# Patient Record
Sex: Male | Born: 1965 | Race: White | Hispanic: No | Smoking: Never smoker
Health system: Southern US, Community
[De-identification: ages and names within clinical notes are randomized; demographics above are authoritative.]

## PROBLEM LIST (undated history)

## (undated) DIAGNOSIS — N189 Chronic kidney disease, unspecified: Secondary | ICD-10-CM

## (undated) DIAGNOSIS — K56609 Unspecified intestinal obstruction, unspecified as to partial versus complete obstruction: Secondary | ICD-10-CM

## (undated) DIAGNOSIS — N200 Calculus of kidney: Secondary | ICD-10-CM

## (undated) DIAGNOSIS — T7840XA Allergy, unspecified, initial encounter: Secondary | ICD-10-CM

## (undated) DIAGNOSIS — I10 Essential (primary) hypertension: Secondary | ICD-10-CM

## (undated) DIAGNOSIS — Z87442 Personal history of urinary calculi: Secondary | ICD-10-CM

## (undated) DIAGNOSIS — Z8719 Personal history of other diseases of the digestive system: Secondary | ICD-10-CM

## (undated) HISTORY — DX: Allergy, unspecified, initial encounter: T78.40XA

## (undated) HISTORY — PX: POLYPECTOMY: SHX149

## (undated) HISTORY — PX: COLOSTOMY: SHX63

## (undated) HISTORY — PX: OTHER SURGICAL HISTORY: SHX169

---

## 2001-06-30 HISTORY — PX: APPENDECTOMY: SHX54

## 2001-06-30 HISTORY — PX: COLON SURGERY: SHX602

## 2001-06-30 HISTORY — PX: COLOSTOMY REVERSAL: SHX5782

## 2001-12-21 ENCOUNTER — Encounter: Payer: Self-pay | Admitting: Emergency Medicine

## 2001-12-22 ENCOUNTER — Inpatient Hospital Stay (HOSPITAL_COMMUNITY): Admission: EM | Admit: 2001-12-22 | Discharge: 2001-12-27 | Payer: Self-pay | Admitting: Emergency Medicine

## 2002-03-14 ENCOUNTER — Encounter: Payer: Self-pay | Admitting: General Surgery

## 2002-03-14 ENCOUNTER — Encounter: Admission: RE | Admit: 2002-03-14 | Discharge: 2002-03-14 | Payer: Self-pay | Admitting: General Surgery

## 2002-04-05 ENCOUNTER — Encounter (INDEPENDENT_AMBULATORY_CARE_PROVIDER_SITE_OTHER): Payer: Self-pay

## 2002-04-05 ENCOUNTER — Inpatient Hospital Stay (HOSPITAL_COMMUNITY): Admission: RE | Admit: 2002-04-05 | Discharge: 2002-04-11 | Payer: Self-pay | Admitting: General Surgery

## 2003-03-11 ENCOUNTER — Encounter: Payer: Self-pay | Admitting: Emergency Medicine

## 2003-03-11 ENCOUNTER — Emergency Department (HOSPITAL_COMMUNITY): Admission: EM | Admit: 2003-03-11 | Discharge: 2003-03-11 | Payer: Self-pay | Admitting: Unknown Physician Specialty

## 2006-06-25 ENCOUNTER — Ambulatory Visit (HOSPITAL_COMMUNITY): Admission: RE | Admit: 2006-06-25 | Discharge: 2006-06-25 | Payer: Self-pay | Admitting: Urology

## 2006-06-25 ENCOUNTER — Emergency Department (HOSPITAL_COMMUNITY): Admission: EM | Admit: 2006-06-25 | Discharge: 2006-06-25 | Payer: Self-pay | Admitting: Emergency Medicine

## 2008-06-30 HISTORY — PX: HERNIA REPAIR: SHX51

## 2008-06-30 HISTORY — PX: CHOLECYSTECTOMY: SHX55

## 2008-12-19 ENCOUNTER — Inpatient Hospital Stay (HOSPITAL_COMMUNITY): Admission: EM | Admit: 2008-12-19 | Discharge: 2008-12-21 | Payer: Self-pay | Admitting: Emergency Medicine

## 2008-12-25 ENCOUNTER — Inpatient Hospital Stay (HOSPITAL_COMMUNITY): Admission: EM | Admit: 2008-12-25 | Discharge: 2009-01-04 | Payer: Self-pay | Admitting: Emergency Medicine

## 2008-12-26 ENCOUNTER — Encounter (INDEPENDENT_AMBULATORY_CARE_PROVIDER_SITE_OTHER): Payer: Self-pay | Admitting: General Surgery

## 2010-10-06 LAB — CBC
HCT: 34.3 % — ABNORMAL LOW (ref 39.0–52.0)
HCT: 36.6 % — ABNORMAL LOW (ref 39.0–52.0)
Hemoglobin: 12.6 g/dL — ABNORMAL LOW (ref 13.0–17.0)
MCHC: 33.8 g/dL (ref 30.0–36.0)
MCHC: 34.2 g/dL (ref 30.0–36.0)
MCV: 84.2 fL (ref 78.0–100.0)
MCV: 84.6 fL (ref 78.0–100.0)
MCV: 85 fL (ref 78.0–100.0)
Platelets: 312 10*3/uL (ref 150–400)
Platelets: 409 10*3/uL — ABNORMAL HIGH (ref 150–400)
RBC: 3.88 MIL/uL — ABNORMAL LOW (ref 4.22–5.81)
RBC: 4.33 MIL/uL (ref 4.22–5.81)
WBC: 10 10*3/uL (ref 4.0–10.5)
WBC: 8.3 10*3/uL (ref 4.0–10.5)

## 2010-10-06 LAB — BASIC METABOLIC PANEL
BUN: 2 mg/dL — ABNORMAL LOW (ref 6–23)
BUN: 3 mg/dL — ABNORMAL LOW (ref 6–23)
CO2: 27 mEq/L (ref 19–32)
CO2: 32 mEq/L (ref 19–32)
Calcium: 8.9 mg/dL (ref 8.4–10.5)
Chloride: 102 mEq/L (ref 96–112)
Chloride: 98 mEq/L (ref 96–112)
Creatinine, Ser: 0.71 mg/dL (ref 0.4–1.5)
Creatinine, Ser: 0.84 mg/dL (ref 0.4–1.5)
GFR calc Af Amer: >60 mL/min (ref >60)
Potassium: 3.7 mEq/L (ref 3.5–5.1)

## 2010-10-07 LAB — COMPREHENSIVE METABOLIC PANEL
ALT: 20 U/L (ref 0–53)
ALT: 34 U/L (ref 0–53)
AST: 21 U/L (ref 0–37)
AST: 29 U/L (ref 0–37)
Albumin: 3.7 g/dL (ref 3.5–5.2)
Albumin: 4 g/dL (ref 3.5–5.2)
Alkaline Phosphatase: 76 U/L (ref 39–117)
BUN: 10 mg/dL (ref 6–23)
BUN: 9 mg/dL (ref 6–23)
CO2: 24 mEq/L (ref 19–32)
CO2: 27 mEq/L (ref 19–32)
Calcium: 8.4 mg/dL (ref 8.4–10.5)
Calcium: 9.8 mg/dL (ref 8.4–10.5)
Chloride: 104 mEq/L (ref 96–112)
Chloride: 105 mEq/L (ref 96–112)
Chloride: 99 mEq/L (ref 96–112)
Creatinine, Ser: 0.97 mg/dL (ref 0.4–1.5)
GFR calc Af Amer: >60 mL/min (ref >60)
GFR calc Af Amer: >60 mL/min (ref >60)
GFR calc non Af Amer: >60 mL/min (ref >60)
GFR calc non Af Amer: >60 mL/min (ref >60)
Potassium: 4.3 mEq/L (ref 3.5–5.1)
Potassium: 4.4 mEq/L (ref 3.5–5.1)
Sodium: 134 mEq/L — ABNORMAL LOW (ref 135–145)
Sodium: 140 mEq/L (ref 135–145)
Total Bilirubin: 0.7 mg/dL (ref 0.3–1.2)
Total Bilirubin: 0.9 mg/dL (ref 0.3–1.2)
Total Protein: 7 g/dL (ref 6.0–8.3)

## 2010-10-07 LAB — DIFFERENTIAL
Basophils Absolute: 0 10*3/uL (ref 0.0–0.1)
Basophils Absolute: 0 10*3/uL (ref 0.0–0.1)
Basophils Relative: 0 % (ref 0–1)
Basophils Relative: 0 % (ref 0–1)
Eosinophils Absolute: 0.1 K/uL (ref 0.0–0.7)
Eosinophils Relative: 0 % (ref 0–5)
Eosinophils Relative: 1 % (ref 0–5)
Lymphocytes Relative: 14 % (ref 12–46)
Lymphs Abs: 1.6 10*3/uL (ref 0.7–4.0)
Monocytes Absolute: 0.5 10*3/uL (ref 0.1–1.0)
Monocytes Absolute: 0.9 K/uL (ref 0.1–1.0)
Monocytes Relative: 3 % (ref 3–12)
Monocytes Relative: 8 % (ref 3–12)
Neutro Abs: 14.1 10*3/uL — ABNORMAL HIGH (ref 1.7–7.7)
Neutro Abs: 8.6 10*3/uL — ABNORMAL HIGH (ref 1.7–7.7)
Neutrophils Relative %: 77 % (ref 43–77)

## 2010-10-07 LAB — URINALYSIS, ROUTINE W REFLEX MICROSCOPIC
Glucose, UA: NEGATIVE mg/dL
Ketones, ur: 40 mg/dL — AB
Nitrite: NEGATIVE
Protein, ur: NEGATIVE mg/dL
Urobilinogen, UA: 0.2 mg/dL (ref 0.0–1.0)

## 2010-10-07 LAB — POTASSIUM: Potassium: 3.9 mEq/L (ref 3.5–5.1)

## 2010-10-07 LAB — LIPASE, BLOOD: Lipase: 17 U/L (ref 11–59)

## 2010-10-07 LAB — CBC
HCT: 46.6 % (ref 39.0–52.0)
HCT: 47.8 % (ref 39.0–52.0)
Hemoglobin: 14.4 g/dL (ref 13.0–17.0)
Hemoglobin: 16 g/dL (ref 13.0–17.0)
MCHC: 33.5 g/dL (ref 30.0–36.0)
MCHC: 34.2 g/dL (ref 30.0–36.0)
MCV: 84.7 fL (ref 78.0–100.0)
Platelets: 288 10*3/uL (ref 150–400)
Platelets: 346 10*3/uL (ref 150–400)
RBC: 5.01 MIL/uL (ref 4.22–5.81)
RBC: 5.25 MIL/uL (ref 4.22–5.81)
RBC: 5.64 MIL/uL (ref 4.22–5.81)
RDW: 13.1 % (ref 11.5–15.5)
RDW: 13.4 % (ref 11.5–15.5)
WBC: 10.8 10*3/uL — ABNORMAL HIGH (ref 4.0–10.5)
WBC: 11.2 10*3/uL — ABNORMAL HIGH (ref 4.0–10.5)
WBC: 15.6 10*3/uL — ABNORMAL HIGH (ref 4.0–10.5)

## 2010-10-07 LAB — COMPREHENSIVE METABOLIC PANEL WITH GFR
ALT: 48 U/L (ref 0–53)
AST: 30 U/L (ref 0–37)
Alkaline Phosphatase: 75 U/L (ref 39–117)
GFR calc Af Amer: >60 mL/min (ref >60)
Glucose, Bld: 105 mg/dL — ABNORMAL HIGH (ref 70–99)
Potassium: 3.7 meq/L (ref 3.5–5.1)
Sodium: 136 meq/L (ref 135–145)
Total Protein: 8.1 g/dL (ref 6.0–8.3)

## 2010-10-07 LAB — CREATININE, SERUM
Creatinine, Ser: 0.91 mg/dL (ref 0.4–1.5)
GFR calc non Af Amer: >60 mL/min (ref >60)

## 2010-11-12 NOTE — H&P (Signed)
NAME:  Tyrone, Fernandez NO.:  [id number]   MEDICAL RECORD NO.:  [id number]          PATIENT TYPE:  INP   LOCATION:  1540                         FACILITY:  [facility]   PHYSICIAN:  Anselm Pancoast. Weatherly, M.D.DATE OF BIRTH:  03/24/1966   DATE OF ADMISSION:  12/25/2008  DATE OF DISCHARGE:                              HISTORY & PHYSICAL   CHIEF COMPLAINT:  Nausea, vomiting, abdominal pain.   HISTORY:  Tyrone Fernandez is a 45 year old Caucasian male who is said in  the hospital last week admitted by Dr. Michaell Cowing for abdominal stitching and  a partial small bowel obstruction.  Supposedly, his symptoms quickly  subsided after approximately 2 days.  He had NG tube for about 24 hours  and abdominal x-ray appeared improved and he was discharged, but he said  he continued to be nauseous at home, had several episodes of vomiting  and returned to the emergency room on the early a.m. of June 28.  He had  a CT on the previous admission that showed it looks as if the small  bowel obstruction was in the distal small bowel.  His previous history  is significant, and he had a perforated sigmoid diverticulitis that I  took care of approximately 7-8 years ago.  He did have a colostomy  takedown.  He had gained a moderate amount of weight, and on the x-ray  when he presented to the hospital last week, it appears he got a  definite hernia, right at his navel that looks like it has multiple  little loops of bowel and kind of Swiss-cheese areas of the abdomen.  He  also has got stones in his gallbladder, and whether he actually had  severe episodes of pain like a gallbladder attack or not, we are not  sure but his gallbladder was distended, and the pain and nausea and  vomiting, whether it is related to be the bowel obstruction or whether  it also has some gallbladder attacks, we are not sure.  Dr. Gerrit Friends was  on call early Monday morning, and he asked Dr. Michaell Cowing who had been caring  for him  previously to see the patient, and Dr. Michaell Cowing saw him  approximately 7:40 in the morning.  The patient was sent up to the floor  with instructions for the nurse to place a nasogastric tube.  The family  desired that either I, Dr. Derrell Lolling or Dr. Abbey Chatters to manage him, and  Dr. Michaell Cowing contacted me probably about 8:30 on Monday morning, and I was  available.   PHYSICAL EXAMINATION:  ABDOMEN:  Distended.  It was certainly not  acutely tender.  I discussed with him after reviewing the x-rays where  the previous x-rays were improved, now he has multiple air-fluid levels  was very dilated small bowel, and I recommended that we go ahead and  place the nasogastric tube, start intravenous fluids and suck on them a  few days.  An NG tube was placed.  It was very difficult to place  because it appears to kind of going against the larynx.  A lot of  coughing and spasm, but finally we were able to get it into the stomach,  and he had a moderate amount of kind of bilious NG drainage.  I did not  place the patient on antibiotics, but will continue with the nasogastric  suction and intravenous fluids.  I have discussed with the patient that  with this kind of combination of issues the patient would need lysis of  adhesions and bowel obstruction is the most pressing problem.  It looks  like he also got a gallbladder issue, and then the incisional hernia,  the combination of with the gallbladder and trying to place any type of  mesh is fairly high incidence of infections and the patient and his wife  are aware of that.   PAST MEDICAL HISTORY:  History of diverticulitis with colostomy  takedown.   MEDICATIONS:  He is on Lexapro 10 mg daily and Prilosec over-the-  counter.   ALLERGIES:  None.   SOCIAL HISTORY:  He works as a Psychologist, occupational for [employer].  He is married  and has several children.   PHYSICAL EXAMINATION:  GENERAL:  He is a pleasant, overweight, Caucasian  male, nauseous, but not really in  real acute distress.  VITAL SIGNS:  When he got to the floor, temperature was 98.4, blood  pressure 115/76, pulse 76, respirations 20.  He is not sure of his  weight, probably about 250, but supposedly has lost about 30-40 pounds  over the last 6 months.  That is through dietary efforts.  HEENT:  Appears adequately hydrated.  LUNGS:  Clear.  CARDIAC:  Normal sinus rhythm.  ABDOMEN:  He is distended, few bowel sounds but decreased bowel sounds.  RECTAL:  I did not do a rectal examination on him.  CNS:  Appears physiologic.  SKIN:  Unremarkable.   STUDIES:  Liver function studies normal.  His white blood count is  11,200.  He had about a 16,000 white count when he was in hospital a  week earlier.   ADMISSION RECURRENT PROBLEM:  1. Mechanical small-bowel obstruction probably adhesions.  2. Gallstones with distended gallbladder incisional hernia.      Anselm Pancoast. Zachery Dakins, M.D.  Electronically Signed     WJW/MEDQ  D:  12/26/2008  T:  12/27/2008  Job:  [id number]

## 2010-11-12 NOTE — Op Note (Signed)
NAME:  Tyrone Fernandez, Tyrone Fernandez NO.:  [id number]   MEDICAL RECORD NO.:  [id number]          PATIENT TYPE:  INP   LOCATION:  1540                         FACILITY:  [facility]   PHYSICIAN:  Anselm Pancoast. Weatherly, M.D.DATE OF BIRTH:  11/19/1965   DATE OF PROCEDURE:  12/26/2008  DATE OF DISCHARGE:                               OPERATIVE REPORT   PREOPERATIVE DIAGNOSES:  1. Small-bowel obstruction, probably adhesions.  2. Gallstones.  3. Incisional hernia.   POSTOPERATIVE DIAGNOSES:  1. Small-bowel obstruction, mechanical secondary to adhesions.  2. Cholecystitis, distended tense gallbladder and two incisional      hernias with multiple Swiss cheese type arrangement.   OPERATION:  Exploratory laparotomy, lysis of adhesions for mechanical  small-bowel obstruction, cholecystectomy, no cholangiogram and repair of  incisional hernia with sutures.   ANESTHESIA:  General anesthesia.   SURGEON:  Dr. Consuello Bossier.   ASSISTANT:  Dr. Angelia Mould. Derrell Lolling.   HISTORY:  Tyrone Fernandez is a 45 year old overweight Caucasian male who is  now approximately 6-7 years following a perforated sigmoid  diverticulitis with abscess and had a colectomy, end colostomy, Hartmann  and then several months later the colostomy was taken down.  The patient  has gained a moderate amount of weight in the last several years, and he  recently has actually lost weight according to his wife.  He was  admitted last week for approximately 2 days with a partial small-bowel  obstruction by Dr. Michaell Cowing, thought to be resolving.  He was released, but  he has continued to be nauseous and he started having vomiting over the  weekend and was readmitted early Monday morning.  Dr. Michaell Cowing saw the  patient, but he desired that I take care of him and I was available and  I agreed.  I discussed with the patient that one; we will place a  nasogastric tube to try to see if we could decompress his small bowel  and then plan on  lysis of adhesions.  He needs his gallbladder removed  and try to repair the incisional hernia.  The family and the patient are  aware that the bowel obstruction and the need for acholecystectomy is a  bad combination if the need plastic type meshes for the repair of the  incisional hernia. The patient has been on nasogastric suction now for  approximately 36 hours, having a large amount of bilious drainage.  Essentially no flatus, and I think that we just need to proceed on with  a surgery.  The patient was in agreement.  He was given 3.375 grams of  Zosyn preoperatively.  A nasogastric tube has been on suction all day  and he is making good urine output.   DESCRIPTION OF PROCEDURE:  The patient was positioned on the OR table.  Induction of general anesthesia, endotracheal tube placed by Dr.  Jean Rosenthal, anesthetist.  He already has a nasogastric tube.  After PAS  stockings were placed, a Foley catheter was inserted.  The abdomen was  clipped around the old midline incision and on reviewing the CT of the  patient's abdomen, it  appears that the hernia is kind of in the upper  area more so than below the umbilicus.  Sharp dissection.  A timeout was  completed appropriately and then had been draped in sterile manner.  I  made a little incision above and below the umbilicus and into a hernia  sac very promptly.  I carefully dissected the hernia sac, just opened  it, and he has got this kind of Swiss cheese type area.  There is no  loops of dilated bowel like the obstruction is from the hernia, but the  bowel is very distended.  I freed up the fascia, so I could open it  probably about 2 inches below the umbilicus.  We took it about 5 inches  above the umbilicus and this is all basically where the hernia sac is.  We then freed the omentum that was adherent to the undersurface of the  abdomen and then the gallbladder was tense, but not acutely inflamed.  I  then basically decided to go ahead and  free up the small bowel first.  There were numerous very dense adhesions down in the pelvis and that is  where the actual point of obstruction was, probably about 2 feet of the  ileocecal valve area.  After we had got the adhesions all freed, then we  could milk back the sulcus entericus into the stomach.  The anesthetist  replaced the NG tube since she had been manipulated it and appeared to  pull it back into the esophagus trying to get everything decompressed in  the stomach, and she placed a new one since it was easier than trying to  slip the old down when she had pulled it back up.  We got out probably  about a canister and a half of NG drainage.  Next, I put a Thompson  retractor in and was working from the left side.  Dr. Derrell Lolling was  assisting from the right side because it gave me better exposure and I  was able to I think safely remove the gallbladder without actually  extended the incision a lot further up.  I put two packs over the colon,  sort of pushing it down, freed up the adhesions around the gallbladder  and then elected to take the gallbladder down in a retrograde manner.  The patient had small stones in his gallbladder and we freed it up,  identified the cystic artery that was doubly clipped with the medium  sized clips and distally.  Once we divided this, then we continued to  dissect and could visualize a small cystic duct and this was actually  tied with a 3-0 Vicryl after it had been clamped with a right-angle and  the gallbladder removed.  I did not do a cholangiogram.  I do not think  we damaged the common bile duct which was narrow on the CT.  I then put  a clip just proximal to the little tie with the small clips.  Since the  gallbladder exposure was difficult, I did place a Blake drain in the  gallbladder fossa and brought it out through a stab wound in the right  upper quadrant.  Next, the small bowel which was decompressed since we  had sucked out the  contents from the stomach.  We were looking at our  options.  I think that we can get the fascia together with sutures and  not plan on using the biological mesh, and I used a kind of  double  looped #1 PDS with interrupted #1 Novofil sutures going wide to where we  had good fascia and all of the hernia sac and all had been removed.  Numerous little vessel areas had been tied with 2-0 Vicryl or cautery  and the fascia is snug, but comes together without excessive tension.  The subcutaneous tissue was debrided and closed with 3-0 Vicryl.  The  skin closed with staples.  We then put sterile 4x4s and ABD on the  dressing.  We put on an abdominal binder and made sure that the  nasogastric tube was in good position prior to closing the abdomen.  The  drain is in the right upper quadrant and does not appear to have any  bilious drainage.  I am going to keep him on Zosyn postoperatively.  We  will keep him on PAS stockings and will start Lovenox in the morning 40  mcg a day.      Anselm Pancoast. Zachery Dakins, M.D.  Electronically Signed     WJW/MEDQ  D:  12/26/2008  T:  12/27/2008  Job:  [id number]

## 2010-11-12 NOTE — H&P (Signed)
NAME:  BRANDY, KABAT NO.:  [id number]   MEDICAL RECORD NO.:  [id number]          PATIENT TYPE:  INP   LOCATION:  1536                         FACILITY:  [facility]   PHYSICIAN:  Ardeth Sportsman, MD     DATE OF BIRTH:  1966/03/24   DATE OF ADMISSION:  12/18/2008  DATE OF DISCHARGE:                              HISTORY & PHYSICAL   PRIMARY CARE:  Lajean Manes with Family [facility].   SURGEON:  Karie Soda.   CHIEF COMPLAINTS:  Nausea, vomiting, and abdominal pain.   PRESENT ILLNESS:  Mr. Jimmey Ralph is a 44 year old male with a history of  perforated diverticulitis requiring emergency colectomy and colostomy in  2003.  Four months later, he had a colostomy takedown.  He recalls  getting a barium enema around that time that was normal which showed no  evidence of any diverticula or polyps.  He has never had a follow-up  colonoscopy.  He has never had a prior abdominal complaints, never prior  history of nausea and vomiting before.  Does have a history of kidney  stones and actually had to have surgery back in 2007 with ureteroscopy  extraction double stent placement.   The patient yesterday started having worsening abdominal pain and nausea  and vomiting.  Emesis became so severe he started bringing up flecks of  blood.  Based on concerns, family brought him to the [facility]  He was evaluated Dr. Ashley Akin.  Dr. Ashley Akin noted an  elevated white count on plain films that showed some air-fluid levels.  Based on concerns, the patient was sent to the emergency room for  evaluation of concern for bowel obstruction.  Therefore, surgical  consultation was requested.   The patient denies any history of dysphagia or heartburn or reflux.  No  history of biliary colic.  Pain described as in his lower abdomen.  It  has decreased since he has been in the emergency room.  He still has  some moderate nausea, though, and needing medications pretty regularly.  He  denies any fevers, chills, sweats.  Denies any dysphagia to solids or  liquids.  No hematochezia or melena.  No dysuria, pyuria or hematuria.   PAST MEDICAL HISTORY:  1. Diverticulitis, perforated requiring urgent surgery and later      colostomy takedown in October 2003.  2. Kidney stones.  3. Anxiety/anger issues.   MEDICATIONS:  He is on Lexapro daily.   ALLERGIES:  None.   SOCIAL HISTORY:  No tobacco or drug use.  He is married in a stable  relationship and is here today with his father as well.   FAMILY HISTORY:  Is noncontributory for any early cardiopulmonary  disease or diabetes or GI tract disorders that he or his family can  recall.   REVIEW OF SYSTEMS:  Noted per HPI.  GENERAL:  No fevers, chills, sweats.  No weight gain or weight loss.  EYES:  Negative.  ENT:  Negative.  CARDIAC:  Negative.  RESPIRATORY:  Is negative.  NEUROLOGICAL:  Negative.  MUSCULOSKELETAL:  Negative.  GU:  As  noted per HPI, otherwise  negative.  TESTICULAR:  Negative.  BREASTS:  Negative.  GI:  As noted  per HPI.  PSYCH:  There have been issues of anxiety and frustration but  otherwise negative.  HEME/ALLERGIC:  Otherwise negative.  DERMATOLOGIC:  Negative.   PHYSICAL EXAMINATION:  VITAL SIGNS:  His T-max is 98.6, respirations 18,  pulse 83, blood pressure 121/70.  GENERAL:  He is well-developed, well-nourished, overweight male lying in  bed, tired but not frankly toxic.  PSYCH:  He is at best awake, alert, and oriented x4 with no evidence of  any dementia, delirium, psychosis or paranoia.  EYES:  Pupils equal, round, and reactive to light.  His sclerae are  mildly injected but otherwise are clear.  No evidence of any icterus.  NEUROLOGICAL:  Cranial II-XII are intact.  Hand grips 5/5 equal and  symmetrical.  No resting or intention tremors.  HEENT:  He is normocephalic.  Membranes are dry, but nasopharynx and  oropharynx are clear.  Dentition is alright.  CHEST:  Clear to auscultation  bilaterally.  No wheezes, rales, or  rhonchi.  No pain to rib or sternal compression.  HEART:  Regular rate and rhythm.  No murmurs, clicks or rubs.  Normal  radial and dorsalis pedis pulses.  ABDOMEN:  Is soft and obese.  He has an infraumbilical incision with a  spontaneous ventral hernia on Valsalva that spontaneously reduces.  It  is not particularly tender.  He has no suprapubic tenderness.  He has no  guarding or rebound tenderness.  GENITAL:  Normal external male genitalia, circumcised.  No obvious  inguinal hernias.  RECTAL:  Refused at this point.  MUSCULOSKELETAL:  Normal range motion at shoulders, elbows, wrists as  well as at hips, knees and ankles.  LYMPH:  No head, neck, axillary or groin lymphadenopathy.  SKIN:  No petechiae, no purpura, no telangiectasis, no caput medusae.  BREASTS:  No sores or lesions or nipple discharge.  BACK:  No pain on cervical thoracolumbosacral spine.  No pain in  costophrenic angles.   STUDIES:  Show white count of 15.6 outside and now is apparently 17.  Electrolytes are all normal except for glucose of 115.  He does have  some ketones in his urinalysis.   Verbal report of the x-ray showed some air-fluid levels in the outside  facility, but I do not have the films with me.   A CAT scan of the abdomen and pelvis with oral and IV contrast, I was  able to review.  He had some mildly dilated small-bowel loops with the  transition zone most likely down in the bladder.  He has a moderate-size  ventral hernia with some omentum in it but no small bowel.  No inguinal  hernias.  He does have right kidney stones, but they are in the renal  pelvis, and there was no evidence of any hydronephrosis or ureteral  obstruction.  He does have incidentally noted gallstones as well but no  gallbladder wall thickening or pericholecystic fluid.   ASSESSMENT AND PLAN:  1. A 45 year old male with nausea, vomiting, and abdominal pain, and      CT scan findings and  history were concerning for small-bowel      obstruction.      a.     Admit.      b.     IV fluids.      c.     NG tube decompression.  d.     Serial exams and probable follow-up films.      e.     If he does not improve, he may require laparoscopic versus       open exploration with probable lysis of adhesions.  2. Ventral hernia.  I do not think that is the source of his bowel      obstruction at this point.  We will follow.  Recommend he have that      evaluated for probable elective repair; most likely laparoscopic      will be an advantage for him.  We will table that issue for right      now since I am not convinced that the bowel obstruction or ileus is      at the hernia itself, and it is more down adhesions inferior to      where the incisional hernia is.  3. Continue Lexapro for anxiety and depression management.  No      evidence of any distress right now.  4. Proton-pump inhibitor with noted hematemesis.  5. DVT prophylaxis with SCDs and heparin.  Early mobilization.      Ardeth Sportsman, MD  Electronically Signed     SCG/MEDQ  D:  12/19/2008  T:  12/19/2008  Job:  [id number]   cc:   Oley Balm. Georgina Pillion, M.D.  Fax: [phone]   Ashley Akin, M.D.  [facility]

## 2010-11-15 NOTE — Op Note (Signed)
NAME:  Tyrone Fernandez, Tyrone Fernandez NO.:  [id number]   MEDICAL RECORD NO.:  [id number]          PATIENT TYPE:  AMB   LOCATION:  DAY                          FACILITY:  [facility]   PHYSICIAN:  Mark C. Vernie Ammons, M.D.  DATE OF BIRTH:  Feb 07, 1966   DATE OF PROCEDURE:  06/25/2006  DATE OF DISCHARGE:                               OPERATIVE REPORT   PREOPERATIVE DIAGNOSIS:  Left ureteral calculus.   POSTOPERATIVE DIAGNOSIS:  Left ureteral calculus.   PROCEDURE:  Cystoscopy, left retrograde pyelogram with interpretation,  left ureteroscopy with stone extraction and double-J stent placement.   SURGEON:  Mark C. Vernie Ammons, M.D.   ANESTHESIA:  General.   DRAINS:  5-French, 26 cm Polaris stent in the left ureter with string.   SPECIMENS:  Stone to patient.   BLOOD LOSS:  Minimal.   COMPLICATIONS:  None.   INDICATIONS:  The patient is a 45 year old white male who awoke this  morning with severe left flank pain.  He continues to have unremitting  pain due to his left ureteral calculus which was seen on CT scan in the  upper ureter measuring approximately 6 mm in size.  We discussed the  treatment options.  He would like to have the stone extracted if  possible and I therefore discussed ureteroscopic extraction and stent  placement. He understands the procedure, its risks and complications as  well as alternatives.   DESCRIPTION OF OPERATION:  After informed consent, the patient was  brought to the major OR, placed on the table, administered general  anesthesia and then moved to the dorsal lithotomy position. His  genitalia was then sterilely prepped and draped.  A 22-French cystoscope  was then passed per urethra with the 12 degree lens. The urethra was  noted to be normal, the sphincter intact, the prostatic urethra without  lesions and nonobstructing.  The bladder was entered, noted be free of  any tumor, stones or inflammatory lesions.  The left orifice was  identified and a  0.038-inch floppy-tip guidewire was then passed up the  left ureter about three-fourths of the way out.  The ureteral orifice  was noted to be relatively small and therefore the ureteral dilating  balloon was passed over the guidewire and the intramural ureter and  distal ureter were then dilated to 20 atmospheres with ureteral dilating  balloon.  I then deflated the balloon and passed a ureteral access  sheath over the guidewire up to near where the stone was felt to be  located.   The patient's body habitus presented good visualization of the stone.  I  therefore injected full strength contrast through the ureteral access  sheath inner cannula and was able to identify the stone as a filling  defect in the upper ureter.  There were no other filling defects or  abnormalities seen within the collecting system or proximal ureter.   A flexible ureteroscope was then passed up the ureteral access sheath  and the stone was visualized.  It was grasped with a nitinol basket and  I was able to manipulate the stone so that I  could get it into the  ureteral access sheath along its long axis. I then extracted the  ureteral access sheath, ureteroscope and stone in toto.   I then reinserted the cystoscope, passed the 0.038-inches floppy-tip  guidewire up the left ureter under direct fluoroscopic control into the  area of the renal pelvis.  I then passed the Polaris stent over the  guidewire into the renal pelvis and removed the guidewire with good curl  being noted in the renal pelvis and the distal portion of the Polaris  stent noted to be in the bladder.  I drained the bladder and removed the  cystoscope.  2% lidocaine jelly local anesthetic was inserted in the  urethra and a penile clamp was applied.  The string affixed to the  distal portion of the stent was then affixed to the dorsum of the penis  and the patient was awakened and taken to the recovery room in stable  satisfactory condition.  He  tolerated the procedure well with no  intraoperative complications.   He will be given a prescription for Vicodin HP #36 and Pyridium Plus #40  and will follow-up in my office in approximately 7 days for stent  removal.      Mark C. Vernie Ammons, M.D.  Electronically Signed     MCO/MEDQ  D:  06/25/2006  T:  06/25/2006  Job:  [id number]

## 2010-11-15 NOTE — Op Note (Signed)
NAME:  Tyrone Fernandez NO.:  [id number]   MEDICAL RECORD NO.:  [id number]                   PATIENT TYPE:  INP   LOCATION:  0001                                 FACILITY:  [facility]   PHYSICIAN:  Anselm Pancoast. Zachery Dakins, M.D.          DATE OF BIRTH:  1966-04-23   DATE OF PROCEDURE:  04/05/2002  DATE OF DISCHARGE:                                 OPERATIVE REPORT   PREOPERATIVE DIAGNOSES:  Tyrone Fernandez status post perforated diverticulitis,  sigmoid.   POSTOPERATIVE DIAGNOSES:  Tyrone Fernandez status post perforated diverticulitis,  sigmoid.   OPERATION:  Take down end colostomy and Hartmann and reestablishment of  bowel continuity and removal of a lipoma or a little synovial cyst, right  forearm.   ANESTHESIA:  General.   SURGEON:  Anselm Pancoast. Zachery Dakins, M.D.   ASSISTANT:  Donnie Coffin. Samuella Cota, M.D.   HISTORY OF PRESENT ILLNESS:  Tyrone Fernandez is a 45 year old Caucasian male  who was admitted through the emergency room approximately four months ago  with acute sigmoid diverticulitis. The patient had had a perforation and had  been treated previously for prostatitis and underwent a sigmoid colon  resection, end colostomy and Hartmann. This was done on December 21, 2001,  postoperatively he has done well. He has lost about 30 pounds with  encouragement, had returned to work, had a barium enema that showed no  evidence of any other remaining diverticulum and has not had any problem  since with his prostate. He had a GoLYTELY, erythromycin and neomycin bowel  prep and actually he had 3 gm of Unasyn preoperatively. The patient has a  small lesion on the right forearm right over the ridge of the ulna that is  symptomatic when he bumps it  and desires that I excised this at the same  time. This was added to the OR schedule.   DESCRIPTION OF PROCEDURE:  The patient was taken to the operative suite,  induction of general anesthesia, endotracheal tube placed and an NG tube  placed through his nose. The Foley catheter was inserted sterilely and then  the midline of the area around his colostomy was shaved. The old midline  incision was opened up, I had placed a pursestring suture in the stomach and  we carefully entered into the peritoneal cavity. He had adhesions in the  lower midline of the small bowel that was carefully dissected down and the  Balfour could be placed. It was noted that he had kind of a large bladder  for someone his age as if he is not possibly emptying completely and we  freed up the distal portion of the sigmoid. The Prolene sutures that I had  placed were easily identified and I elected to do a hand sewn anastomosis  right at the sacral promontory. The proximal colon was freed, dropped down  and it was not necessary to mobilize the splenic flexure and we trimmed off  about 2 inches proximally that had gone up through the abdominal walls about  an inch distally and then did a single layer of 3-0 silk knots inverted  anastomosis under direct vision. In the little mesentery there was only one  little area to close with 3-0 silk and we then placed the omentum down over  the small bowel. I did do an incidental appendectomy, the mesentery being  ligated with a 2-0 Vicryl. The appendix tied with a pursestring suture of 3-  0 silk, the stump was inverted. The patient tolerated the procedure nicely.  The perineum where the stoma had been was closed with running #0 PDS and  then the anterior fascia was closed with interrupted sutures of #1 Novofil  and then the midline was closed with interrupted sutures of #1 Novofil.  Sponge counts correct, estimated blood loss is minimal. At the completion of  the skin with staples, I then used different instruments and a sterile prep.  I removed this little lesion on the forearm. This was down in the periosteum  under the fascia. It is not a  lipoma but carefully I dissected this out and  then closed the skin  with 4-0 nylon simple sutures. The patient tolerated  the procedure nicely, the NG tube was in his stomach and will keep his Foley  for probably two days and use PCA morphine for postoperative pain control. I  will keep him on antibiotics for about four days postoperatively.                                               Anselm Pancoast. Zachery Dakins, M.D.    WJW/MEDQ  D:  04/05/2002  T:  04/05/2002  Job:  [id number]   cc:   Loraine Leriche C. Vernie Ammons, M.D.

## 2010-11-15 NOTE — Discharge Summary (Signed)
NAME:  BAYANI, RENTERIA NO.:  [id number]   MEDICAL RECORD NO.:  [id number]          PATIENT TYPE:  INP   LOCATION:  1537                         FACILITY:  [facility]   PHYSICIAN:  Anselm Pancoast. Weatherly, M.D.DATE OF BIRTH:  1965-10-29   DATE OF ADMISSION:  12/24/2008  DATE OF DISCHARGE:  01/04/2009                               DISCHARGE SUMMARY   DISCHARGE DIAGNOSIS:  1. Small bowel obstruction secondary to extensive adhesions.  2. Chronic cholecystitis with stones.   OPERATION:  1. Exploratory laparotomy.  2. Lysis of adhesions.  3. Repair of ventral hernia.  4. Cholecystectomy.   HISTORY:  Azir Muzyka is a 45 year old Caucasian male who was admitted  several days prior to this readmission with Dr. Michaell Cowing for a partial  small bowel obstruction.  He discovered an incisional hernia in the  upper aspect of a resection of a sigmoid colon for a perforated  diverticulitis and colostomy takedown about 7 or 8 years previously  having been done by me.  His symptoms of the obstruction appeared to  have subsided after approximately 2 days.  He had an NG tube, and he was  released, but he continued to have nausea and vomiting and return to the  emergency room on December 25, 2008.  The patient and his family requested  that I see him if possible, and on examination, he was obviously  significantly distended, and it did not appear that the hernia was the  actual point of the blockage, and that the small bowel is dilated down  deep into the pelvis.   I recommended that we try to manage him conservatively and,, since he  has got a hernia that may need mesh, and he was in agreement with this,  we would place the NG tube.  He had a moderate amount of bilious  drainage, but he really did not clinically significantly improved with  24 hours of the NG suction. I recommended we go ahead and proceed with  surgery.  This was done on the 29th, the following day.  Dr. Derrell Lolling  assisted, and  what we found was definitely a very dilated small bowel  from adhesions in the pelvis.  These adhesions were lysed and the bowel  completely freed up.  Because of the diverticulitis, the colostomy, and  colostomy takedown, he had extensive adhesions, but he  also had a  subacutely inflamed gallbladder that was markedly distended.  I did a  cholecystectomy also.  Postoperatively, we continued with the NG tube,  and he had a large amount of bilious drainage for approximately 2-3  days.  Then on the approximately 5th postoperative day, the patient had  decreasing NG drainage, started having bowel movements, and the  nasogastric tube was removed on the sixth postoperative day.  His  abdomen became softer.  He had a drain in the gallbladder fossa that  never had any bilious drainage.  We had not done  a cholangiogram at the  time of surgery, and his diet was advanced.  He appeared to be doing  satisfactory.  He was ready for  discharge by Dr. Derrell Lolling on the ninth  postoperative day. His staples are still in place.  His drain have been  removed.  He states that he prefers Tylenol for pain.  It appears that  he is one of these.  Narcotics really give him an ileus pattern.   LABORATORY STUDIES:  Postoperatively were white count normal, hematocrit  was about 33, and his electrolytes remained normal throughout the  hospitalization.  He works for [employer], and will be out of work for  approximately 4 weeks total and will be seen in the office in  approximately 1 week for a recheck.      Anselm Pancoast. Zachery Dakins, M.D.  Electronically Signed     WJW/MEDQ  D:  01/18/2009  T:  01/18/2009  Job:  [id number]

## 2010-11-15 NOTE — Discharge Summary (Signed)
NAME:  [name], [name] NO.:  [id number]   MEDICAL RECORD NO.:  [id number]          PATIENT TYPE:  INP   LOCATION:  1537                         FACILITY:  [facility]   PHYSICIAN:  Anselm Pancoast. Weatherly, M.D.DATE OF BIRTH:  07-03-1965   DATE OF ADMISSION:  12/24/2008  DATE OF DISCHARGE:  01/04/2009                               DISCHARGE SUMMARY   DISCHARGE DIAGNOSIS:  1. Obstruction terminal ileum secondary to a Meckel's diverticulitis.  2. History of sigmoid diverticulosis.   OPERATION:  Exploratory laparotomy, release of the obstruction and a  Meckel's diverticulum excision.   HISTORY:  Tyrone Fernandez is a 45 year old Caucasian male who had been  admitted by the hospitalist after being admitted by Dr. Michaell Cowing several  days earlier for a partial small bowel obstruction.  Supposedly, his  symptoms quickly subsided after 2 days. He was released; but after  approximately 2 days, his symptoms returned with several episodes of  vomiting.  He returned to the emergency room on the early a.m. of June  28.  His CT on the previous admission showed what looked like a small  bowel obstruction of the distal small bowel, and his only past history  was significant for proximal sigmoid diverticulitis, an episode which  was managed surgically approximately 7 to 8 years ago by me.   NOTE:  There is no further dictation beyond this point.      Anselm Pancoast. Zachery Dakins, M.D.  Electronically Signed     WJW/MEDQ  D:  01/18/2009  T:  01/18/2009  Job:  [id number]

## 2010-11-15 NOTE — Discharge Summary (Signed)
NAME:  Tyrone Fernandez, Tyrone Fernandez NO.:  [id number]   MEDICAL RECORD NO.:  [id number]                   PATIENT TYPE:  INP   LOCATION:  0450                                 FACILITY:  [facility]   PHYSICIAN:  Anselm Pancoast. Zachery Dakins, M.D.          DATE OF BIRTH:  1966/01/14   DATE OF ADMISSION:  04/05/2002  DATE OF DISCHARGE:  04/11/2002                                 DISCHARGE SUMMARY   DISCHARGE DIAGNOSIS:  Status post perforated sigmoid diverticulitis with end  colostomy and Hartman.   OPERATION:  1. Take down of end colostomy with Luz Brazen, re-established colon continuity.  2. Excision of a small lipoma, right forearm.   HISTORY OF PRESENT ILLNESS:  The patient is a 45 year old Caucasian male who  was admitted through the emergency room with acute sigmoid diverticulitis.  The patient had for about three months prior to that recurrent evidence of  what they thought was prostatitis, and was actually referred over to Dr.  Margrett Rud office after he presented there with his symptoms earlier that  day.  CT showed changes consistent with perforated sigmoid diverticulitis,  and he was taken to surgery, and did have an element of acute inflammation  of the sigmoid colon pushing on the bladder, and a sigmoid colon resection  and end colostomy with Luz Brazen was performed.  The patient has done nicely.  Returned to work.  Has had a barium enema that showed no evidence of  diverticulitis on the per colostomy and per rectum, and was readmitted at  this time for take down of the colostomy.  He does have a little  subcutaneous nodule on the right arm over the ulnar crease that is  symptomatic when he puts pressure on it, and desires to have that excised  also.   HOSPITAL COURSE:  The patient was taken to surgery.  Dr. Samuella Cota assisted, and  under general anesthesia, the end colostomy was taken down, in-sewn  anastomosis to his pelvis was performed, and the area of colon  proximally  and distally that was resected showed acute and chronic diverticulitis.  The  little subcutaneous area that was also excised was a small lipoma.  This was  located a little deeper in the fascia level, and the usual under the  subcutaneous tissue.  Postoperatively, he did nicely.  He had a NG tube for  three days, and the tube was removed.  Foley catheter was removed on the  second postoperative day, and he was started on a liquid diet on the fourth  postoperative day.  This was advanced to a regular diet.  He is ready for  discharge now on the sixth postoperative day.  His incisions are healing  nicely.  He is not requiring any pain medications at this point.  He is  ready for discharge in improved condition at this time.    FOLLOWUP:  I will see him in the office for  removal of his staples and  stitches in the forearm later this week.  We plan to keep him out of work  for probably three to four weeks according to the strenuous nature of his  work, he works as a Psychologist, occupational at [employer].   He is not on any type of chronic medications.  His incision is healing  nicely at the time of his discharge.                                                  Anselm Pancoast. Zachery Dakins, M.D.    WJW/MEDQ  D:  04/11/2002  T:  04/11/2002  Job:  [id number]   cc:   Loraine Leriche C. Vernie Ammons, M.D.

## 2013-02-06 ENCOUNTER — Emergency Department (HOSPITAL_COMMUNITY): Payer: Self-pay

## 2013-02-06 ENCOUNTER — Emergency Department (HOSPITAL_COMMUNITY)
Admission: EM | Admit: 2013-02-06 | Discharge: 2013-02-06 | Disposition: A | Discharge status: Home / Self Care | Payer: Self-pay | Attending: Emergency Medicine | Admitting: Emergency Medicine

## 2013-02-06 ENCOUNTER — Encounter (HOSPITAL_COMMUNITY): Payer: Self-pay | Admitting: Emergency Medicine

## 2013-02-06 DIAGNOSIS — Z87442 Personal history of urinary calculi: Secondary | ICD-10-CM | POA: Insufficient documentation

## 2013-02-06 DIAGNOSIS — N201 Calculus of ureter: Secondary | ICD-10-CM | POA: Insufficient documentation

## 2013-02-06 DIAGNOSIS — R1110 Vomiting, unspecified: Secondary | ICD-10-CM | POA: Insufficient documentation

## 2013-02-06 LAB — CBC WITH DIFFERENTIAL/PLATELET
Basophils Absolute: 0 10*3/uL (ref 0.0–0.1)
Basophils Relative: 0 % (ref 0–1)
Eosinophils Absolute: 0.1 10*3/uL (ref 0.0–0.7)
Eosinophils Relative: 1 % (ref 0–5)
HCT: 46.5 % (ref 39.0–52.0)
Hemoglobin: 16.3 g/dL (ref 13.0–17.0)
Lymphocytes Relative: 16 % (ref 12–46)
Lymphs Abs: 1.6 10*3/uL (ref 0.7–4.0)
MCH: 28.2 pg (ref 26.0–34.0)
MCHC: 35.1 g/dL (ref 30.0–36.0)
MCV: 80.3 fL (ref 78.0–100.0)
Monocytes Absolute: 0.5 10*3/uL (ref 0.1–1.0)
Monocytes Relative: 5 % (ref 3–12)
Neutro Abs: 7.8 10*3/uL — ABNORMAL HIGH (ref 1.7–7.7)
Neutrophils Relative %: 78 % — ABNORMAL HIGH (ref 43–77)
Platelets: 319 10*3/uL (ref 150–400)
RBC: 5.79 MIL/uL (ref 4.22–5.81)
RDW: 13.7 % (ref 11.5–15.5)
WBC: 9.9 10*3/uL (ref 4.0–10.5)

## 2013-02-06 LAB — BASIC METABOLIC PANEL
BUN: 10 mg/dL (ref 6–23)
CO2: 22 mEq/L (ref 19–32)
Calcium: 9.8 mg/dL (ref 8.4–10.5)
Chloride: 101 mEq/L (ref 96–112)
Creatinine, Ser: 0.9 mg/dL (ref 0.50–1.35)
GFR calc Af Amer: >90 mL/min (ref >90)
GFR calc non Af Amer: >90 mL/min (ref >90)
Glucose, Bld: 126 mg/dL — ABNORMAL HIGH (ref 70–99)
Potassium: 4 mEq/L (ref 3.5–5.1)
Sodium: 138 mEq/L (ref 135–145)

## 2013-02-06 LAB — URINALYSIS, ROUTINE W REFLEX MICROSCOPIC
Glucose, UA: NEGATIVE mg/dL
Ketones, ur: 15 mg/dL — AB
Nitrite: NEGATIVE
Protein, ur: 100 mg/dL — AB
Specific Gravity, Urine: 1.029 (ref 1.005–1.030)
Urobilinogen, UA: 0.2 mg/dL (ref 0.0–1.0)
pH: 5.5 (ref 5.0–8.0)

## 2013-02-06 LAB — URINE MICROSCOPIC-ADD ON

## 2013-02-06 MED ORDER — TAMSULOSIN HCL 0.4 MG PO CAPS: 0.4000 mg | ORAL_CAPSULE | Freq: Every day | ORAL | Status: DC

## 2013-02-06 MED ORDER — HYDROMORPHONE HCL PF 1 MG/ML IJ SOLN
1.0000 mg | Freq: Once | INTRAMUSCULAR | Status: AC
  Administered 2013-02-06: 1 mg via INTRAVENOUS
  Filled 2013-02-06: qty 1

## 2013-02-06 MED ORDER — PERCOCET 5-325 MG PO TABS: 1.0000 | ORAL_TABLET | Freq: Four times a day (QID) | ORAL | Status: DC | PRN

## 2013-02-06 MED ORDER — ONDANSETRON HCL 4 MG/2ML IJ SOLN
4.0000 mg | Freq: Once | INTRAMUSCULAR | Status: AC
  Administered 2013-02-06: 4 mg via INTRAVENOUS
  Filled 2013-02-06: qty 2

## 2013-02-06 MED ORDER — PROMETHAZINE HCL 25 MG PO TABS: 25.0000 mg | ORAL_TABLET | Freq: Three times a day (TID) | ORAL | Status: DC | PRN

## 2013-02-06 NOTE — ED Provider Notes (Signed)
CSN: [id number]     Arrival date & time 02/06/13  1056 History     First MD Initiated Contact with Patient 02/06/13 1135     Chief Complaint  Patient presents with  . Back Pain   (Consider location/radiation/quality/duration/timing/severity/associated sxs/prior Treatment) HPI Patient presents to the emergency department with back pain that radiates to his right testicular area.  Patient, states the pain started acutely last night.  The patient, states, that he's had previous history of kidney stone.  Patient denies chest pain, shortness of breath, weakness, numbness, dizziness, headache, blurred vision, diarrhea, fever, or syncope.  Patient, states, that he had several episodes of vomiting.  Patient did not take any medications prior to arrival History reviewed. No pertinent past medical history. Past Surgical History  Procedure Laterality Date  . Colon surgery    . Cholecystectomy    . Appendectomy    . Hernia repair     History reviewed. No pertinent family history. History  Substance Use Topics  . Smoking status: Never Smoker   . Smokeless tobacco: Not on file  . Alcohol Use: No    Review of Systems All other systems negative except as documented in the HPI. All pertinent positives and negatives as reviewed in the HPI. Allergies  Review of patient's allergies indicates no known allergies.  Home Medications  No current outpatient prescriptions on file. BP 134/75  Pulse 83  Temp(Src) 98 F (36.7 C) (Oral)  Resp 16  Ht 5\' 10"  (1.778 m)  Wt 277 lb (125.646 kg)  BMI 39.75 kg/m2  SpO2 95% Physical Exam  Nursing note and vitals reviewed. Constitutional: He is oriented to person, place, and time. He appears well-developed and well-nourished.  HENT:  Head: Normocephalic and atraumatic.  Mouth/Throat: Oropharynx is clear and moist.  Neck: Normal range of motion. Neck supple.  Cardiovascular: Normal rate, regular rhythm and normal heart sounds.  Exam reveals no gallop and  no friction rub.   No murmur heard. Pulmonary/Chest: Effort normal and breath sounds normal. No respiratory distress.  Abdominal: Soft. Bowel sounds are normal. He exhibits no distension. There is no tenderness. There is no guarding.  Neurological: He is alert and oriented to person, place, and time. He exhibits normal muscle tone. Coordination normal.  Skin: Skin is warm and dry.    ED Course   Procedures (including critical care time)  Labs Reviewed  CBC WITH DIFFERENTIAL - Abnormal; Notable for the following:    Neutrophils Relative % 78 (*)    Neutro Abs 7.8 (*)    All other components within normal limits  BASIC METABOLIC PANEL - Abnormal; Notable for the following:    Glucose, Bld 126 (*)    All other components within normal limits  URINALYSIS, ROUTINE W REFLEX MICROSCOPIC - Abnormal; Notable for the following:    Color, Urine BROWN (*)    APPearance TURBID (*)    Hgb urine dipstick LARGE (*)    Bilirubin Urine SMALL (*)    Ketones, ur 15 (*)    Protein, ur 100 (*)    Leukocytes, UA SMALL (*)    All other components within normal limits  URINE MICROSCOPIC-ADD ON - Abnormal; Notable for the following:    Bacteria, UA FEW (*)    Crystals CA OXALATE CRYSTALS (*)    All other components within normal limits  URINE CULTURE   Ct Abdomen Pelvis Wo Contrast  02/06/2013   *RADIOLOGY REPORT*  Clinical Data: 47 year old male with right flank, abdominal and pelvic  pain  CT ABDOMEN AND PELVIS WITHOUT CONTRAST  Technique:  Multidetector CT imaging of the abdomen and pelvis was performed following the standard protocol without intravenous contrast.  Comparison: 12/19/2008 CT  Findings: Lung bases are clear.  A 4 mm proximal right ureteral calculus identified causing mild right hydronephrosis. A punctate nonobstructing calculus within the mid right kidney and three separate nonobstructing 3 mm calculi within the left lower kidney are noted.  The adrenal glands, pancreas and spleen are  unremarkable. Fatty infiltration of the liver is again identified. The patient is status post cholecystectomy  Please note that parenchymal abnormalities may be missed as intravenous contrast was not administered.  No free fluid, enlarged lymph nodes, biliary dilation or abdominal aortic aneurysm identified.  Two moderate wide mouthed abdominal ventral hernias are noted, the superior hernia containing colon.  There is no evidence of bowel obstruction, pneumoperitoneum or abscess.  No acute or suspicious bony abnormalities are identified.  A moderate to severe apex left thoracolumbar scoliosis is present.  IMPRESSION: 4 mm proximal right ureteral calculus causing mild right hydronephrosis.  Nonobstructing bilateral renal calculi.  Fatty infiltration of the liver.  Two moderate wide mouthed abdominal ventral hernias, the superior hernia containing colon.  No evidence of bowel obstruction.   Original Report Authenticated By: Harmon Pier, M.D.   Patient is feeling better following pain medication.  He will be referred to urology for further evaluation and care the patient is advised to return here as needed, for any worsening in his condition.  Patient is advised to increase his fluid intake.  Patient is advised of the plan and all questions were answered.  Patient, agrees to the plan that is in place  MDM  MDM Reviewed: vitals and nursing note Interpretation: labs and CT scan      Carlyle Dolly, PA-C 02/06/13 1404

## 2013-02-06 NOTE — ED Provider Notes (Signed)
Medical screening examination/treatment/procedure(s) were performed by non-physician practitioner and as supervising physician I was immediately available for consultation/collaboration.  Flint Melter, MD 02/06/13 [phone]

## 2013-02-06 NOTE — ED Notes (Signed)
Right lower back pain starting at 0800 today; pain also radiating into right groin and testicle. Nausea and vomiting x3 today. History of kidney stones, diverticulitis, a hernia. He is not sure if it may be one of these things.

## 2013-02-06 NOTE — ED Notes (Signed)
Patient transported to CT 

## 2013-02-06 NOTE — ED Notes (Signed)
Patient returned from CT

## 2013-02-07 LAB — URINE CULTURE: Culture: NO GROWTH

## 2013-03-16 ENCOUNTER — Encounter (HOSPITAL_COMMUNITY): Payer: Self-pay

## 2013-03-16 ENCOUNTER — Emergency Department (HOSPITAL_COMMUNITY)
Admission: EM | Admit: 2013-03-16 | Discharge: 2013-03-16 | Disposition: A | Discharge status: Home / Self Care | Payer: Self-pay | Attending: Emergency Medicine | Admitting: Emergency Medicine

## 2013-03-16 DIAGNOSIS — N200 Calculus of kidney: Secondary | ICD-10-CM | POA: Insufficient documentation

## 2013-03-16 LAB — POCT I-STAT, CHEM 8
Calcium, Ion: 1.18 mmol/L (ref 1.12–1.23)
HCT: 45 % (ref 39.0–52.0)
TCO2: 23 mmol/L (ref 0–100)

## 2013-03-16 LAB — URINALYSIS, ROUTINE W REFLEX MICROSCOPIC
Ketones, ur: NEGATIVE mg/dL
Leukocytes, UA: NEGATIVE
Nitrite: NEGATIVE
Protein, ur: NEGATIVE mg/dL
Urobilinogen, UA: 0.2 mg/dL (ref 0.0–1.0)

## 2013-03-16 LAB — URINE MICROSCOPIC-ADD ON

## 2013-03-16 MED ORDER — HYDROMORPHONE HCL PF 1 MG/ML IJ SOLN
1.0000 mg | Freq: Once | INTRAMUSCULAR | Status: AC
  Administered 2013-03-16: 1 mg via INTRAVENOUS
  Filled 2013-03-16: qty 1

## 2013-03-16 MED ORDER — OXYCODONE-ACETAMINOPHEN 5-325 MG PO TABS: 1.0000 | ORAL_TABLET | ORAL | Status: DC | PRN

## 2013-03-16 MED ORDER — ONDANSETRON HCL 4 MG/2ML IJ SOLN
4.0000 mg | Freq: Once | INTRAMUSCULAR | Status: AC
  Administered 2013-03-16: 4 mg via INTRAVENOUS
  Filled 2013-03-16: qty 2

## 2013-03-16 MED ORDER — PROMETHAZINE HCL 25 MG PO TABS: 25.0000 mg | ORAL_TABLET | Freq: Four times a day (QID) | ORAL | Status: DC | PRN

## 2013-03-16 MED ORDER — TAMSULOSIN HCL 0.4 MG PO CAPS: 0.4000 mg | ORAL_CAPSULE | Freq: Two times a day (BID) | ORAL | Status: DC

## 2013-03-16 MED ORDER — HYDROMORPHONE HCL PF 1 MG/ML IJ SOLN: 1.0000 mg | Freq: Once | INTRAMUSCULAR | Status: DC

## 2013-03-16 MED ORDER — KETOROLAC TROMETHAMINE 30 MG/ML IJ SOLN
30.0000 mg | Freq: Once | INTRAMUSCULAR | Status: AC
  Administered 2013-03-16: 30 mg via INTRAVENOUS
  Filled 2013-03-16: qty 1

## 2013-03-16 MED ORDER — NAPROXEN 500 MG PO TABS: 500.0000 mg | ORAL_TABLET | Freq: Two times a day (BID) | ORAL | Status: DC

## 2013-03-16 NOTE — ED Notes (Signed)
Pt discharged.Vital signs stable and GCS 15 

## 2013-03-16 NOTE — ED Provider Notes (Signed)
CSN: [id number]     Arrival date & time 03/16/13  0702 History   First MD Initiated Contact with Patient 03/16/13 [phone]     Chief Complaint  Patient presents with  . Back Pain  . 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  Sx were acute in onset at 2 AM, persistent, but fluctuating in intensity - sharp and stabbing and associated with n/v and diaphoresis.  The patient was seen approximately one and a half months ago when he was diagnosed with a 4 mm proximal right ureteral stone, there were 3 residual stones in the right kidney all of which were less than 3 mm. He has not made much urine overnight, he has not noticed hematuria. He does not have fevers chills coughing or shortness of breath  Patient is a 47 y.o. male presenting with back pain and flank pain. The history is provided by the patient and medical records.  Back Pain Flank Pain    History reviewed. No pertinent past medical history. Past Surgical History  Procedure Laterality Date  . Colon surgery    . Cholecystectomy    . Appendectomy    . Hernia repair     History reviewed. No pertinent family history. History  Substance Use Topics  . Smoking status: Never Smoker   . Smokeless tobacco: Not on file  . Alcohol Use: No    Review of Systems  Genitourinary: Positive for flank pain.  Musculoskeletal: Positive for back pain.  All other systems reviewed and are negative.    Allergies  Review of patient's allergies indicates no known allergies.  Home Medications   Current Outpatient Rx  Name  Route  Sig  Dispense  Refill  . promethazine (PHENERGAN) 25 MG tablet   Oral   Take 1 tablet (25 mg total) by mouth every 8 (eight) hours as needed for nausea.   15 tablet   0   . naproxen (NAPROSYN) 500 MG tablet   Oral   Take 1 tablet  (500 mg total) by mouth 2 (two) times daily with a meal.   30 tablet   0   . oxyCODONE-acetaminophen (PERCOCET) 5-325 MG per tablet   Oral   Take 1 tablet by mouth every 4 (four) hours as needed for pain.   20 tablet   0   . promethazine (PHENERGAN) 25 MG tablet   Oral   Take 1 tablet (25 mg total) by mouth every 6 (six) hours as needed for nausea.   12 tablet   0   . tamsulosin (FLOMAX) 0.4 MG CAPS capsule   Oral   Take 1 capsule (0.4 mg total) by mouth 2 (two) times daily.   10 capsule   0    BP 119/81  Pulse 77  Temp(Src) 98.5 F (36.9 C) (Oral)  Resp 18  Ht 5\' 10"  (1.778 m)  Wt 269 lb (122.018 kg)  BMI 38.6 kg/m2  SpO2 95% Physical Exam  Nursing note and vitals reviewed. Constitutional: He appears well-developed and well-nourished. No distress.  HENT:  Head: Normocephalic and atraumatic.  Mouth/Throat: Oropharynx is clear and moist. No oropharyngeal exudate.  Eyes: Conjunctivae and EOM are normal. Pupils are equal, round, and reactive to light. Right eye exhibits no discharge. Left eye exhibits no discharge. No scleral icterus.  Neck: Normal range of motion. Neck supple. No JVD present. No  thyromegaly present.  Cardiovascular: Normal rate, regular rhythm, normal heart sounds and intact distal pulses.  Exam reveals no gallop and no friction rub.   No murmur heard. Pulmonary/Chest: Effort normal and breath sounds normal. No respiratory distress. He has no wheezes. He has no rales.  Abdominal: Soft. Bowel sounds are normal. He exhibits no distension and no mass. There is no tenderness.  Musculoskeletal: Normal range of motion. He exhibits no edema and no tenderness.  Lymphadenopathy:    He has no cervical adenopathy.  Neurological: He is alert. Coordination normal.  Skin: Skin is warm and dry. No rash noted. No erythema.  Psychiatric: He has a normal mood and affect. His behavior is normal.    ED Course  Procedures (including critical care time) Labs  Review Labs Reviewed  URINALYSIS, ROUTINE W REFLEX MICROSCOPIC - Abnormal; Notable for the following:    Hgb urine dipstick LARGE (*)    All other components within normal limits  URINE MICROSCOPIC-ADD ON - Abnormal; Notable for the following:    Crystals CA OXALATE CRYSTALS (*)    All other components within normal limits  POCT I-STAT, CHEM 8 - Abnormal; Notable for the following:    Potassium 3.4 (*)    Glucose, Bld 142 (*)    All other components within normal limits   Imaging Review No results found.  MDM   1. Right kidney stone    The patient has no abdominal tenderness, no signs of hernia or scrotal abnormalities, no CVA tenderness. Given his recent CT scan showing some 4 mm kidney stones there is no acute indication for repeat CT scan. He has been pain free for the last month and a half dust raising the suspicion that this is a new kidney stone possibly one of the residual 3 mm stones that was in the kidney. Will treat with pain medication, Flomax, antiemetics, check urinalysis for infection and check kidney function.  The patient has been reexamined several times, appears stable, improved with medications, urinalysis consistent with hematuria and crystals, basic metabolic panel reveals preserved kidney function, offered CT scan but declined, patient will followup with urology.   Meds given in ED:  Medications  HYDROmorphone (DILAUDID) injection 1 mg (not administered)  HYDROmorphone (DILAUDID) injection 1 mg (1 mg Intravenous Given 03/16/13 0729)  ketorolac (TORADOL) 30 MG/ML injection 30 mg (30 mg Intravenous Given 03/16/13 0729)  ondansetron (ZOFRAN) injection 4 mg (4 mg Intravenous Given 03/16/13 0729)    New Prescriptions   NAPROXEN (NAPROSYN) 500 MG TABLET    Take 1 tablet (500 mg total) by mouth 2 (two) times daily with a meal.   OXYCODONE-ACETAMINOPHEN (PERCOCET) 5-325 MG PER TABLET    Take 1 tablet by mouth every 4 (four) hours as needed for pain.   PROMETHAZINE  (PHENERGAN) 25 MG TABLET    Take 1 tablet (25 mg total) by mouth every 6 (six) hours as needed for nausea.   TAMSULOSIN (FLOMAX) 0.4 MG CAPS CAPSULE    Take 1 capsule (0.4 mg total) by mouth 2 (two) times daily.      Vida Roller, MD 03/16/13 [phone]

## 2013-03-16 NOTE — ED Notes (Addendum)
Patient was in about a week ago with c/o kidney stones was tx  And sent home with strainer and pain meds, Patient c/o today pain in lower back, flank pain and pain in right testicle 9/10, patient having trouble urinating, patient has had two bouts of vomiting this morning, both times brown in color, no blood or coffee grounds,

## 2013-03-16 NOTE — ED Notes (Signed)
Pt made aware of need for urine sample.  

## 2013-03-16 NOTE — ED Notes (Signed)
Pt able to tolerate PO fluids.  

## 2013-04-24 ENCOUNTER — Encounter (HOSPITAL_COMMUNITY): Payer: Self-pay | Admitting: Emergency Medicine

## 2013-04-24 ENCOUNTER — Emergency Department (HOSPITAL_COMMUNITY)
Admission: EM | Admit: 2013-04-24 | Discharge: 2013-04-24 | Disposition: A | Discharge status: Home / Self Care | Payer: Self-pay | Attending: Emergency Medicine | Admitting: Emergency Medicine

## 2013-04-24 DIAGNOSIS — Z87442 Personal history of urinary calculi: Secondary | ICD-10-CM | POA: Insufficient documentation

## 2013-04-24 DIAGNOSIS — R1110 Vomiting, unspecified: Secondary | ICD-10-CM | POA: Insufficient documentation

## 2013-04-24 DIAGNOSIS — Z9089 Acquired absence of other organs: Secondary | ICD-10-CM | POA: Insufficient documentation

## 2013-04-24 DIAGNOSIS — Z79899 Other long term (current) drug therapy: Secondary | ICD-10-CM | POA: Insufficient documentation

## 2013-04-24 DIAGNOSIS — Z791 Long term (current) use of non-steroidal anti-inflammatories (NSAID): Secondary | ICD-10-CM | POA: Insufficient documentation

## 2013-04-24 DIAGNOSIS — E669 Obesity, unspecified: Secondary | ICD-10-CM | POA: Insufficient documentation

## 2013-04-24 DIAGNOSIS — Z9889 Other specified postprocedural states: Secondary | ICD-10-CM | POA: Insufficient documentation

## 2013-04-24 DIAGNOSIS — N509 Disorder of male genital organs, unspecified: Secondary | ICD-10-CM | POA: Insufficient documentation

## 2013-04-24 DIAGNOSIS — N23 Unspecified renal colic: Secondary | ICD-10-CM

## 2013-04-24 LAB — URINE MICROSCOPIC-ADD ON

## 2013-04-24 LAB — COMPREHENSIVE METABOLIC PANEL
ALT: 28 U/L (ref 0–53)
Calcium: 9.2 mg/dL (ref 8.4–10.5)
Creatinine, Ser: 0.93 mg/dL (ref 0.50–1.35)
GFR calc Af Amer: >90 mL/min (ref >90)
Glucose, Bld: 135 mg/dL — ABNORMAL HIGH (ref 70–99)
Sodium: 139 mEq/L (ref 135–145)
Total Protein: 8 g/dL (ref 6.0–8.3)

## 2013-04-24 LAB — POCT I-STAT, CHEM 8
Chloride: 104 mEq/L (ref 96–112)
Glucose, Bld: 135 mg/dL — ABNORMAL HIGH (ref 70–99)
HCT: 46 % (ref 39.0–52.0)
Hemoglobin: 15.6 g/dL (ref 13.0–17.0)
Potassium: 3.6 mEq/L (ref 3.5–5.1)
Sodium: 142 mEq/L (ref 135–145)

## 2013-04-24 LAB — CBC WITH DIFFERENTIAL/PLATELET
Basophils Absolute: 0 10*3/uL (ref 0.0–0.1)
Eosinophils Absolute: 0 10*3/uL (ref 0.0–0.7)
Eosinophils Relative: 0 % (ref 0–5)
Lymphs Abs: 1.6 10*3/uL (ref 0.7–4.0)
MCH: 28.3 pg (ref 26.0–34.0)
MCV: 82.2 fL (ref 78.0–100.0)
Monocytes Absolute: 0.5 10*3/uL (ref 0.1–1.0)
Platelets: 324 10*3/uL (ref 150–400)
RDW: 13.6 % (ref 11.5–15.5)

## 2013-04-24 LAB — URINALYSIS, ROUTINE W REFLEX MICROSCOPIC
Bilirubin Urine: NEGATIVE
Leukocytes, UA: NEGATIVE
Nitrite: NEGATIVE
Specific Gravity, Urine: 1.03 (ref 1.005–1.030)
Urobilinogen, UA: 0.2 mg/dL (ref 0.0–1.0)
pH: 5.5 (ref 5.0–8.0)

## 2013-04-24 MED ORDER — ONDANSETRON HCL 4 MG/2ML IJ SOLN
4.0000 mg | Freq: Once | INTRAMUSCULAR | Status: AC
  Administered 2013-04-24: 4 mg via INTRAVENOUS
  Filled 2013-04-24: qty 2

## 2013-04-24 MED ORDER — HYDROMORPHONE HCL PF 2 MG/ML IJ SOLN
2.0000 mg | Freq: Once | INTRAMUSCULAR | Status: AC
  Administered 2013-04-24: 2 mg via INTRAVENOUS
  Filled 2013-04-24: qty 1

## 2013-04-24 MED ORDER — KETOROLAC TROMETHAMINE 30 MG/ML IJ SOLN
30.0000 mg | Freq: Once | INTRAMUSCULAR | Status: AC
  Administered 2013-04-24: 30 mg via INTRAVENOUS
  Filled 2013-04-24: qty 1

## 2013-04-24 NOTE — ED Notes (Signed)
Pt states he has been diagnosed with kidney stones and that instead of passing them they have been getting stuck and are slow to pass.

## 2013-04-24 NOTE — ED Provider Notes (Signed)
CSN: [id number]     Arrival date & time 04/24/13  7253 History   First MD Initiated Contact with Patient 04/24/13 0720     Chief Complaint  Patient presents with  . Flank Pain   (Consider location/radiation/quality/duration/timing/severity/associated sxs/prior Treatment) Patient is a 47 y.o. male presenting with flank pain.  Flank Pain   Complains of right flank pain radiating to right testicle onset 2 weeks ago typical of kidney stones have passed. Seen by Dr. Mena Goes for same complaint prescribed Flomax. As also taking one OxyContin tablet this morning, without relief. Vomited one time today. Last bowel movement yesterday, normal. No fever. No other associated symptoms pain is sharp, severe. No other associated symptoms. History reviewed. No pertinent past medical history. Past Surgical History  Procedure Laterality Date  . Colon surgery    . Cholecystectomy    . Appendectomy    . Hernia repair     History reviewed. No pertinent family history. History  Substance Use Topics  . Smoking status: Never Smoker   . Smokeless tobacco: Not on file  . Alcohol Use: No    Review of Systems  Constitutional: Negative.   HENT: Negative.   Respiratory: Negative.   Cardiovascular: Negative.   Gastrointestinal: Positive for vomiting.  Genitourinary: Positive for flank pain and testicular pain.  Musculoskeletal: Negative.   Skin: Negative.   Neurological: Negative.   Psychiatric/Behavioral: Negative.   All other systems reviewed and are negative.    Allergies  Review of patient's allergies indicates no known allergies.  Home Medications   Current Outpatient Rx  Name  Route  Sig  Dispense  Refill  . naproxen (NAPROSYN) 500 MG tablet   Oral   Take 1 tablet (500 mg total) by mouth 2 (two) times daily with a meal.   30 tablet   0   . oxyCODONE-acetaminophen (PERCOCET) 5-325 MG per tablet   Oral   Take 1 tablet by mouth every 4 (four) hours as needed for pain.   20 tablet  0   . promethazine (PHENERGAN) 25 MG tablet   Oral   Take 1 tablet (25 mg total) by mouth every 8 (eight) hours as needed for nausea.   15 tablet   0   . promethazine (PHENERGAN) 25 MG tablet   Oral   Take 1 tablet (25 mg total) by mouth every 6 (six) hours as needed for nausea.   12 tablet   0   . tamsulosin (FLOMAX) 0.4 MG CAPS capsule   Oral   Take 1 capsule (0.4 mg total) by mouth 2 (two) times daily.   10 capsule   0    There were no vitals taken for this visit. Physical Exam  Nursing note and vitals reviewed. Constitutional: He appears well-developed and well-nourished.  HENT:  Head: Normocephalic and atraumatic.  Eyes: Conjunctivae are normal. Pupils are equal, round, and reactive to light.  Neck: Neck supple. No tracheal deviation present. No thyromegaly present.  Cardiovascular: Normal rate and regular rhythm.   No murmur heard. Pulmonary/Chest: Effort normal and breath sounds normal.  Abdominal: Soft. Bowel sounds are normal. He exhibits no distension. There is no tenderness.  Obese multiple surgical scars.  Genitourinary:  Scrotum normal. No flank tenderness  Musculoskeletal: Normal range of motion. He exhibits no edema and no tenderness.  Neurological: He is alert. Coordination normal.  Skin: Skin is warm and dry. No rash noted.  Psychiatric: He has a normal mood and affect.    ED Course  Procedures (  including critical care time) Labs Review Labs Reviewed  CBC WITH DIFFERENTIAL - Abnormal; Notable for the following:    WBC 12.5 (*)    Neutrophils Relative % 82 (*)    Neutro Abs 10.2 (*)    All other components within normal limits  COMPREHENSIVE METABOLIC PANEL  URINALYSIS, ROUTINE W REFLEX MICROSCOPIC   Imaging Review No results found.  EKG Interpretation   None      Results for orders placed during the hospital encounter of 04/24/13  CBC WITH DIFFERENTIAL      Result Value Range   WBC 12.5 (*) 4.0 - 10.5 K/uL   RBC 5.27  4.22 - 5.81  MIL/uL   Hemoglobin 14.9  13.0 - 17.0 g/dL   HCT 96.0  45.4 - 09.8 %   MCV 82.2  78.0 - 100.0 fL   MCH 28.3  26.0 - 34.0 pg   MCHC 34.4  30.0 - 36.0 g/dL   RDW 11.9  14.7 - 82.9 %   Platelets 324  150 - 400 K/uL   Neutrophils Relative % 82 (*) 43 - 77 %   Neutro Abs 10.2 (*) 1.7 - 7.7 K/uL   Lymphocytes Relative 13  12 - 46 %   Lymphs Abs 1.6  0.7 - 4.0 K/uL   Monocytes Relative 4  3 - 12 %   Monocytes Absolute 0.5  0.1 - 1.0 K/uL   Eosinophils Relative 0  0 - 5 %   Eosinophils Absolute 0.0  0.0 - 0.7 K/uL   Basophils Relative 0  0 - 1 %   Basophils Absolute 0.0  0.0 - 0.1 K/uL  COMPREHENSIVE METABOLIC PANEL      Result Value Range   Sodium 139  135 - 145 mEq/L   Potassium 3.7  3.5 - 5.1 mEq/L   Chloride 102  96 - 112 mEq/L   CO2 23  19 - 32 mEq/L   Glucose, Bld 135 (*) 70 - 99 mg/dL   BUN 13  6 - 23 mg/dL   Creatinine, Ser 5.62  0.50 - 1.35 mg/dL   Calcium 9.2  8.4 - 13.0 mg/dL   Total Protein 8.0  6.0 - 8.3 g/dL   Albumin 3.8  3.5 - 5.2 g/dL   AST 22  0 - 37 U/L   ALT 28  0 - 53 U/L   Alkaline Phosphatase 95  39 - 117 U/L   Total Bilirubin 0.3  0.3 - 1.2 mg/dL   GFR calc non Af Amer >90  >90 mL/min   GFR calc Af Amer >90  >90 mL/min  URINALYSIS, ROUTINE W REFLEX MICROSCOPIC      Result Value Range   Color, Urine YELLOW  YELLOW   APPearance CLOUDY (*) CLEAR   Specific Gravity, Urine 1.030  1.005 - 1.030   pH 5.5  5.0 - 8.0   Glucose, UA NEGATIVE  NEGATIVE mg/dL   Hgb urine dipstick LARGE (*) NEGATIVE   Bilirubin Urine NEGATIVE  NEGATIVE   Ketones, ur NEGATIVE  NEGATIVE mg/dL   Protein, ur NEGATIVE  NEGATIVE mg/dL   Urobilinogen, UA 0.2  0.0 - 1.0 mg/dL   Nitrite NEGATIVE  NEGATIVE   Leukocytes, UA NEGATIVE  NEGATIVE  URINE MICROSCOPIC-ADD ON      Result Value Range   Squamous Epithelial / LPF RARE  RARE   WBC, UA 0-2  <3 WBC/hpf   RBC / HPF 7-10  <3 RBC/hpf   Bacteria, UA RARE  RARE   Crystals  CA OXALATE CRYSTALS (*) NEGATIVE  POCT I-STAT, CHEM 8      Result  Value Range   Sodium 142  135 - 145 mEq/L   Potassium 3.6  3.5 - 5.1 mEq/L   Chloride 104  96 - 112 mEq/L   BUN 12  6 - 23 mg/dL   Creatinine, Ser 1.61  0.50 - 1.35 mg/dL   Glucose, Bld 096 (*) 70 - 99 mg/dL   Calcium, Ion 0.45  4.09 - 1.23 mmol/L   TCO2 23  0 - 100 mmol/L   Hemoglobin 15.6  13.0 - 17.0 g/dL   HCT 81.1  91.4 - 78.2 %   No results found.  8:10 AM Received minimal relief after treatment with Toradol and Zofran. Nausea is resolved. Pain minimally improved. Hydromorphone ordered 9:40 AM is much improved and ready to go home. Alert ambulatory Glasgow Coma Score 15. MDM  No diagnosis found. Plan patient is told he can take up to 2 oxycodone tablets every 4 hours as needed for pain. Follow up with Dr. Mena Goes if pain not well controlled Diagnosis #1ureteral colic #2yperglycemia    Doug Sou, MD 04/24/13 [phone]

## 2013-05-02 ENCOUNTER — Other Ambulatory Visit: Payer: Self-pay | Admitting: Urology

## 2013-05-02 ENCOUNTER — Encounter (HOSPITAL_COMMUNITY): Payer: Self-pay | Admitting: *Deleted

## 2013-05-02 MED ORDER — DEXTROSE 5 % IV SOLN
3.0000 g | INTRAVENOUS | Status: AC
  Administered 2013-05-03: 3 g via INTRAVENOUS
  Filled 2013-05-02 (×2): qty 3000

## 2013-05-02 NOTE — H&P (Signed)
History of Present Illness           The patient seen in consultation for nephrolithiasis referred by Dr. Eber Hong October 2014. The patient developed right flank pain August of 2014 and a CT scan done on 02/06/13 revealed a 4 mm stone in the proximal right ureter with some mild dilatation of the collecting system as well as a punctate calcification in the mid right kidney and 3 stones in the lower pole of the left kidney measuring approximately 3 mm in size each.   The pain seemed to resolve and then about a month ago he developed recurrent right flank pain radiating down to the right lower quadrant and testicle. He was seen in the ER on a urinalysis showed hematuria and he was treated for a ureteral stone. He was discharge with pain medicine and tamsulosin.  Today, he continues to have some frequency and urgency. He has a good flow. He has no fever and is staying hydrated. He hasnt been taking the tamsulosin. He has not seen a stone pass.   He has a history of stones. He developed left flank pain and a CT scan in 12/07 revealed a single stone in the left ureter.  No renal calculi were noted at that time. On 06/25/06 he underwent left ureteroscopy and stone extraction.  Stone analysis: Calcium oxalate 60% and calcium phosphate 40%   KUB today - reviewed imaging compared to the CT scan August 2014, findings: Or no obvious stones along the course of the right kidney her right ureter. There is a small left lower pole stone. The moderate scoliosis of the thoracic spine. Bowel gas pattern appears normal.   Past Medical History Problems  1. History of  Nephrolithiasis V13.01  Surgical History Problems  1. History of  Cholecystectomy 2. History of  Colostomy 3. History of  Colostomy Closure 4. History of  Knee Surgery  Current Meds 1. No Reported Medications  Allergies Medication  1. No Known Drug Allergies  Family History Problems  1. Family history of  Blood In Urine 2. Family  history of  Family Health Status Number Of Children 1 daughter 3. Family history of  Family Health Status Number Of Children 1 daughter 4. Family history of  Family Health Status Of Father - Alive 5. Family history of  Family Health Status Of Mother - Alive 6. Family history of  Nephrolithiasis 7. Paternal history of  Urologic Disorder V18.7 kidney stones  Social History Problems    Caffeine Use 40oz per day   Marital History - Currently Married   Marital History - Divorced V61.03   Occupation: metal fabricator Denied    Alcohol Use   Tobacco Use V15.82  Review of Systems Genitourinary, constitutional, skin, eye, otolaryngeal, hematologic/lymphatic, cardiovascular, pulmonary, endocrine, musculoskeletal, gastrointestinal, neurological and psychiatric system(s) were reviewed and pertinent findings if present are noted.    Vitals Vital Signs [Data Includes: Last 1 Day]  14Oct2014 01:20PM  BMI Calculated: 39.37 BSA Calculated: 2.39 Height: 5 ft 10 in Weight: 275 lb  Blood Pressure: 131 / 84 Temperature: 98.4 F Heart Rate: 95  Physical Exam Constitutional: Well nourished and well developed . No acute distress.  ENT:. The ears and nose are normal in appearance.  Neck: The appearance of the neck is normal and no neck mass is present.  Pulmonary: No respiratory distress and normal respiratory rhythm and effort.  Cardiovascular: Heart rate and rhythm are normal . No peripheral edema.  Abdomen: The abdomen is soft  and nontender. No masses are palpated. No CVA tenderness. No hernias are palpable. No hepatosplenomegaly noted.  Rectal: Rectal exam demonstrates normal sphincter tone, no tenderness and no masses. The prostate has no nodularity and is not tender. The left seminal vesicle is nonpalpable. The right seminal vesicle is nonpalpable. The perineum is normal on inspection.  Genitourinary: Examination of the penis demonstrates no discharge, no masses, no lesions and a normal  meatus. The scrotum is without lesions. The right epididymis is palpably normal and non-tender. The left epididymis is palpably normal and non-tender. The right testis is non-tender and without masses. The left testis is non-tender and without masses.  Lymphatics: The femoral and inguinal nodes are not enlarged or tender.  Skin: Normal skin turgor, no visible rash and no visible skin lesions.  Neuro/Psych:. Mood and affect are appropriate.    Results/Data Urine [Data Includes: Last 1 Day]   14Oct2014  COLOR YELLOW   APPEARANCE CLOUDY   SPECIFIC GRAVITY 1.025   pH 6.0   GLUCOSE NEG mg/dL  BILIRUBIN NEG   KETONE NEG mg/dL  BLOOD LARGE   PROTEIN NEG mg/dL  UROBILINOGEN 0.2 mg/dL  NITRITE NEG   LEUKOCYTE ESTERASE NEG   SQUAMOUS EPITHELIAL/HPF RARE   WBC 0-2 WBC/hpf  RBC TNTC RBC/hpf  BACTERIA FEW   CRYSTALS NONE SEEN   CASTS NONE SEEN    Assessment Assessed  1. Nephrolithiasis Of Both Kidneys 592.0 2. Proximal Ureteral Stone On The Right 592.1   normal DRE, I did not palpate a stone in the prostatic urethra   Plan Health Maintenance (V70.0)  1. UA With REFLEX  Done: 14Oct2014 12:51PM Nephrolithiasis Of Both Kidneys (592.0)  2. KUB  Done: 14Oct2014 12:00AM Proximal Ureteral Stone On The Right (592.1)  3. Tamsulosin HCl 0.4 MG Oral Capsule; TAKE 1 CAPSULE Daily; Therapy: 14Oct2014 to  (Evaluate:11Feb2015)  Requested for: 14Oct2014; Last Rx:14Oct2014; Edited 4. Follow-up Weeks 3-4 Office  Follow-up  Requested for: 14Oct2014  Discussion/Summary       Discuss with patient based on his history and the urinalysis it appears he is still passing the stone. We discussed the nature risks and benefits of continued stone passage and restarting off label alpha-blocker or proceeding to the OR for cystoscopy, right retrograde pyelogram, possible right ureteroscopy. All questions answered. He would like to continue stone passage and we discussed the importance of followup to ensure the  hematuria resolves and the stone passes. Discussed to call if he develops uncontrolled pain, fever, chills, N/V.     Signatures Electronically signed by : Jerilee Field, M.D.; Apr 12 2013  2:24PM   Add: Pt saw Georga Hacking 10/28 and was noted to have right hydro on U/S, no stone on KUB. I reviewed all the images. Pt called and wanted to go ahead and set up a procedure to remove stone.

## 2013-05-02 NOTE — Progress Notes (Signed)
SAMEDAY SURGERY INSTRUCTIONS AND CHLORHEXIDINE SHOWERS / PRECAUTIONS REVIEWED WITH PT BY PHONE AND HE VOICED UNDERSTANDING.

## 2013-05-02 NOTE — Anesthesia Preprocedure Evaluation (Signed)
Anesthesia Evaluation  Patient identified by MRN, date of birth, ID band Patient awake    Reviewed: Allergy & Precautions, H&P , NPO status , Patient's Chart, lab work & pertinent test results  Airway Mallampati: II TM Distance: >3 FB Neck ROM: Full    Dental no notable dental hx.    Pulmonary neg pulmonary ROS,  breath sounds clear to auscultation  Pulmonary exam normal       Cardiovascular negative cardio ROS  Rhythm:Regular Rate:Normal     Neuro/Psych negative neurological ROS  negative psych ROS   GI/Hepatic Neg liver ROS, hiatal hernia,   Endo/Other  negative endocrine ROS  Renal/GU negative Renal ROS  negative genitourinary   Musculoskeletal negative musculoskeletal ROS (+)   Abdominal   Peds negative pediatric ROS (+)  Hematology negative hematology ROS (+)   Anesthesia Other Findings   Reproductive/Obstetrics negative OB ROS                           Anesthesia Physical Anesthesia Plan  ASA: II  Anesthesia Plan: General   Post-op Pain Management:    Induction: Intravenous  Airway Management Planned: LMA  Additional Equipment:   Intra-op Plan:   Post-operative Plan: Extubation in OR  Informed Consent: I have reviewed the patients History and Physical, chart, labs and discussed the procedure including the risks, benefits and alternatives for the proposed anesthesia with the patient or authorized representative who has indicated his/her understanding and acceptance.   Dental advisory given  Plan Discussed with: CRNA  Anesthesia Plan Comments:         Anesthesia Quick Evaluation

## 2013-05-03 ENCOUNTER — Ambulatory Visit (HOSPITAL_COMMUNITY): Payer: Self-pay | Admitting: Anesthesiology

## 2013-05-03 ENCOUNTER — Encounter (HOSPITAL_COMMUNITY): Payer: Self-pay | Admitting: Anesthesiology

## 2013-05-03 ENCOUNTER — Encounter (HOSPITAL_COMMUNITY): Payer: Self-pay | Admitting: *Deleted

## 2013-05-03 ENCOUNTER — Encounter (HOSPITAL_COMMUNITY)
Admission: RE | Disposition: A | Discharge status: Home / Self Care | Payer: Self-pay | Source: Ambulatory Visit | Attending: Urology

## 2013-05-03 ENCOUNTER — Ambulatory Visit (HOSPITAL_COMMUNITY)
Admission: RE | Admit: 2013-05-03 | Discharge: 2013-05-03 | Disposition: A | Discharge status: Home / Self Care | Payer: Self-pay | Source: Ambulatory Visit | Attending: Urology | Admitting: Urology

## 2013-05-03 DIAGNOSIS — M4120 Other idiopathic scoliosis, site unspecified: Secondary | ICD-10-CM | POA: Insufficient documentation

## 2013-05-03 DIAGNOSIS — Z87891 Personal history of nicotine dependence: Secondary | ICD-10-CM | POA: Insufficient documentation

## 2013-05-03 DIAGNOSIS — N200 Calculus of kidney: Secondary | ICD-10-CM | POA: Insufficient documentation

## 2013-05-03 DIAGNOSIS — Z87442 Personal history of urinary calculi: Secondary | ICD-10-CM | POA: Insufficient documentation

## 2013-05-03 DIAGNOSIS — N1330 Unspecified hydronephrosis: Secondary | ICD-10-CM | POA: Insufficient documentation

## 2013-05-03 DIAGNOSIS — N201 Calculus of ureter: Secondary | ICD-10-CM | POA: Insufficient documentation

## 2013-05-03 DIAGNOSIS — Z9089 Acquired absence of other organs: Secondary | ICD-10-CM | POA: Insufficient documentation

## 2013-05-03 HISTORY — DX: Personal history of urinary calculi: Z87.442

## 2013-05-03 HISTORY — DX: Calculus of kidney: N20.0

## 2013-05-03 HISTORY — DX: Personal history of other diseases of the digestive system: Z87.19

## 2013-05-03 HISTORY — PX: CYSTOSCOPY WITH URETEROSCOPY AND STENT PLACEMENT: SHX6377

## 2013-05-03 HISTORY — DX: Chronic kidney disease, unspecified: N18.9

## 2013-05-03 SURGERY — CYSTOURETEROSCOPY, WITH STENT INSERTION
Anesthesia: General | Site: Pelvis | Laterality: Right | Wound class: Clean Contaminated

## 2013-05-03 MED ORDER — FENTANYL CITRATE 0.05 MG/ML IJ SOLN: 25.0000 ug | INTRAMUSCULAR | Status: DC | PRN

## 2013-05-03 MED ORDER — ONDANSETRON HCL 4 MG/2ML IJ SOLN
INTRAMUSCULAR | Status: DC | PRN
  Administered 2013-05-03: 4 mg via INTRAVENOUS

## 2013-05-03 MED ORDER — MIDAZOLAM HCL 5 MG/5ML IJ SOLN
INTRAMUSCULAR | Status: DC | PRN
  Administered 2013-05-03: 2 mg via INTRAVENOUS

## 2013-05-03 MED ORDER — LACTATED RINGERS IV SOLN
INTRAVENOUS | Status: DC | PRN
  Administered 2013-05-03: 07:00:00 via INTRAVENOUS

## 2013-05-03 MED ORDER — OXYCODONE-ACETAMINOPHEN 5-325 MG PO TABS
1.0000 | ORAL_TABLET | ORAL | Status: DC | PRN
  Administered 2013-05-03: 1 via ORAL
  Filled 2013-05-03: qty 1

## 2013-05-03 MED ORDER — FENTANYL CITRATE 0.05 MG/ML IJ SOLN
INTRAMUSCULAR | Status: DC | PRN
  Administered 2013-05-03: 50 ug via INTRAVENOUS
  Administered 2013-05-03: 100 ug via INTRAVENOUS
  Administered 2013-05-03: 25 ug via INTRAVENOUS

## 2013-05-03 MED ORDER — LIDOCAINE HCL 1 % IJ SOLN
INTRAMUSCULAR | Status: DC | PRN
  Administered 2013-05-03: 20 mg via INTRADERMAL

## 2013-05-03 MED ORDER — PROPOFOL 10 MG/ML IV BOLUS
INTRAVENOUS | Status: DC | PRN
  Administered 2013-05-03: 230 mg via INTRAVENOUS

## 2013-05-03 MED ORDER — PROMETHAZINE HCL 25 MG/ML IJ SOLN: 6.2500 mg | INTRAMUSCULAR | Status: DC | PRN

## 2013-05-03 MED ORDER — SODIUM CHLORIDE 0.9 % IR SOLN
Status: DC | PRN
  Administered 2013-05-03: 1000 mL

## 2013-05-03 MED ORDER — 0.9 % SODIUM CHLORIDE (POUR BTL) OPTIME
TOPICAL | Status: DC | PRN
  Administered 2013-05-03: 1000 mL

## 2013-05-03 MED ORDER — BELLADONNA ALKALOIDS-OPIUM 16.2-60 MG RE SUPP
RECTAL | Status: AC
  Filled 2013-05-03: qty 1

## 2013-05-03 MED ORDER — IOHEXOL 300 MG/ML  SOLN
INTRAMUSCULAR | Status: DC | PRN
  Administered 2013-05-03: 50 mL via INTRAVENOUS

## 2013-05-03 MED ORDER — LIDOCAINE HCL 2 % EX GEL
CUTANEOUS | Status: AC
  Filled 2013-05-03: qty 10

## 2013-05-03 SURGICAL SUPPLY — 18 items
BAG URO CATCHER STRL LF (DRAPE) ×3 IMPLANT
BASKET STNLS GEMINI 4WIRE 3FR (BASKET) ×2 IMPLANT
BSKT STON RTRVL GEM 120X11 3FR (BASKET) ×2
CATH INTERMIT  6FR 70CM (CATHETERS) ×3 IMPLANT
CATH URET 5FR 28IN CONE TIP (BALLOONS)
CATH URET 5FR 70CM CONE TIP (BALLOONS) IMPLANT
CLOTH BEACON ORANGE TIMEOUT ST (SAFETY) ×1 IMPLANT
CONT SPEC 4OZ CLIKSEAL STRL BL (MISCELLANEOUS) ×1 IMPLANT
DRAPE CAMERA CLOSED 9X96 (DRAPES) ×3 IMPLANT
GLOVE BIO SURGEON STRL SZ7.5 (GLOVE) ×3 IMPLANT
GLOVE BIOGEL M STRL SZ7.5 (GLOVE) ×3 IMPLANT
GOWN STRL REIN XL XLG (GOWN DISPOSABLE) ×5 IMPLANT
GUIDEWIRE STR DUAL SENSOR (WIRE) ×3 IMPLANT
MANIFOLD NEPTUNE II (INSTRUMENTS) ×3 IMPLANT
PACK CYSTO (CUSTOM PROCEDURE TRAY) ×3 IMPLANT
STENT CONTOUR 6FRX26X.038 (STENTS) ×2 IMPLANT
TUBING CONNECTING 10 (TUBING) ×3 IMPLANT
WIRE COONS/BENSON .038X145CM (WIRE) ×1 IMPLANT

## 2013-05-03 NOTE — Transfer of Care (Signed)
Immediate Anesthesia Transfer of Care Note  Patient: Tyrone Fernandez  Procedure(s) Performed: Procedure(s): CYSTOSCOPY, RIGHT RETROGRADE PYELOGRAMRIGHT URETEROSCOPY, STONE EXTRACTION  AND STENT PLACEMENT (Right) HOLMIUM LASER APPLICATION (N/A)  Patient Location: PACU  Anesthesia Type:General  Level of Consciousness: awake, alert , oriented and patient cooperative  Airway & Oxygen Therapy: Patient connected to face mask oxygen  Post-op Assessment: Report given to PACU RN, Post -op Vital signs reviewed and stable and Patient moving all extremities  Post vital signs: Reviewed and stable  Complications: No apparent anesthesia complications

## 2013-05-03 NOTE — Anesthesia Postprocedure Evaluation (Signed)
  Anesthesia Post-op Note  Patient: Tyrone Fernandez  Procedure(s) Performed: Procedure(s) (LRB): CYSTOSCOPY, RIGHT RETROGRADE PYELOGRAMRIGHT URETEROSCOPY, STONE EXTRACTION  AND STENT PLACEMENT (Right) HOLMIUM LASER APPLICATION (N/A)  Patient Location: PACU  Anesthesia Type: General  Level of Consciousness: awake and alert   Airway and Oxygen Therapy: Patient Spontanous Breathing  Post-op Pain: mild  Post-op Assessment: Post-op Vital signs reviewed, Patient's Cardiovascular Status Stable, Respiratory Function Stable, Patent Airway and No signs of Nausea or vomiting  Last Vitals:  Filed Vitals:   05/03/13 0940  BP: 127/70  Pulse: 77  Temp:   Resp: 16    Post-op Vital Signs: stable   Complications: No apparent anesthesia complications

## 2013-05-03 NOTE — Interval H&P Note (Signed)
History and Physical Interval Note:  05/03/2013 7:42 AM  I should add we discussed occasional need for pre-stenting and staged procedure as well as risk of ureteral injury among other risks. We also discussed the nature, potential benefits, risks and alternatives to ureteroscopy, including side effects of the proposed treatment, the likelihood of the patient achieving the goals of the procedure, and any potential problems that might occur during the procedure or recuperation.     Antony Haste

## 2013-05-03 NOTE — Op Note (Signed)
Preoperative diagnosis: Right ureteral stone, right hydronephrosis Postoperative diagnosis: Same  Procedure: Exam under anesthesia Cystoscopy Right ureteroscopy, stone basket extraction Right ureteral stent placement  Surgeon: Keyante Durio  Type of anesthesia: Gen.  Findings: On exam under anesthesia the penis and testicles are normal without lesion or mass. On digital rectal exam the prostate was small and benign without hard area or nodule.  On cystoscopy the urethra and prostatic urethra appeared normal. The trigone and ureteral orifices were in their normal orthotopic position. The right ureteral orifice was mounded consistent with a stone in the intramural ureter. The mucosa of the bladder appeared normal. There was no foreign body or stone in the bladder.  Right retrograde pyelogram - there is a filling defect in the intramural ureter consistent with the stone, proximal to this the distal and mid ureter appeared normal without filling defect and only mild dilation. The proximal ureter and collecting system only partially filled.  On ureteroscopy the stone was located in the intramural ureter and no other stone noted.  Description of procedure: After consent was obtained and his father the operating room. After adequate anesthesia he was placed in lithotomy position and prepped and draped in the usual sterile fashion. A timeout was performed to confirm the patient and procedure. An exam under anesthesia was performed. A cystoscope was passed per urethra and Scout imaging obtained. The right ureter was gently cannulated with a 6 [place] open-ended ureteral catheter and retrograde injection of contrast was performed in a gentle manner as not to push the stent and retrograde. A sensor wire was then advanced and coiled in the collecting system. Adjacent to the wire a rigid ureteroscope was advanced, the stone located in the intramural ureter where it was encircled with a [employer]. It was  removed intact without difficulty. Reinspection of the intramural and distal ureter with ureteroscopy found it to be normal and no other stones or injury. The wire was backloaded on the cystoscope and a 6 x 26 cm stent was advanced. The wire was removed and a good coil seen in the collecting system and a good coil in the bladder. The bladder was drained and the scope removed. The patient was awakened and taken to the recovery room in stable condition.  Complications: None Blood loss: Minimal  Drains: 6 x 26 cm right ureteral stent with string  Specimens: Ureteral stent - office lab

## 2013-05-03 NOTE — Interval H&P Note (Signed)
History and Physical Interval Note:  05/03/2013 7:35 AM  Tyrone Fernandez  has presented today for surgery, with the diagnosis of right ureteral stone   The various methods of treatment have been discussed with the patient and family. After consideration of risks, benefits and other options for treatment, the patient has consented to  Procedure(s): RIGHT URETEROSCOPY LASER LITHO AND STENT PLACEMENT (Right) HOLMIUM LASER APPLICATION (N/A) as a surgical intervention. Patient continues to have RLQ discomfort assoc with frequency and urgency. He has not passed a stone. No fever or emesis. The patient's history has been reviewed, patient examined, no change in status, stable for surgery.  I have reviewed the patient's chart and labs.  Questions were answered to the patient's satisfaction.     Antony Haste

## 2013-05-04 ENCOUNTER — Encounter (HOSPITAL_COMMUNITY): Payer: Self-pay | Admitting: Urology

## 2013-11-16 ENCOUNTER — Emergency Department (HOSPITAL_COMMUNITY)
Admission: EM | Admit: 2013-11-16 | Discharge: 2013-11-16 | Disposition: A | Discharge status: Home / Self Care | Payer: Self-pay | Source: Home / Self Care

## 2013-11-16 ENCOUNTER — Emergency Department (INDEPENDENT_AMBULATORY_CARE_PROVIDER_SITE_OTHER): Payer: Self-pay

## 2013-11-16 ENCOUNTER — Encounter (HOSPITAL_COMMUNITY): Payer: Self-pay | Admitting: Emergency Medicine

## 2013-11-16 DIAGNOSIS — R0982 Postnasal drip: Secondary | ICD-10-CM

## 2013-11-16 DIAGNOSIS — N3944 Nocturnal enuresis: Secondary | ICD-10-CM

## 2013-11-16 DIAGNOSIS — M545 Low back pain, unspecified: Secondary | ICD-10-CM

## 2013-11-16 DIAGNOSIS — M533 Sacrococcygeal disorders, not elsewhere classified: Secondary | ICD-10-CM

## 2013-11-16 DIAGNOSIS — R05 Cough: Secondary | ICD-10-CM

## 2013-11-16 DIAGNOSIS — R059 Cough, unspecified: Secondary | ICD-10-CM

## 2013-11-16 LAB — POCT URINALYSIS DIP (DEVICE)
Bilirubin Urine: NEGATIVE
Glucose, UA: NEGATIVE mg/dL
HGB URINE DIPSTICK: NEGATIVE
Ketones, ur: NEGATIVE mg/dL
Leukocytes, UA: NEGATIVE
Nitrite: NEGATIVE
PROTEIN: NEGATIVE mg/dL
Specific Gravity, Urine: 1.025 (ref 1.005–1.030)
UROBILINOGEN UA: 0.2 mg/dL (ref 0.0–1.0)
pH: 5.5 (ref 5.0–8.0)

## 2013-11-16 MED ORDER — GUAIFENESIN-CODEINE 100-10 MG/5ML PO SYRP: 5.0000 mL | ORAL_SOLUTION | ORAL | Status: DC | PRN

## 2013-11-16 NOTE — ED Notes (Signed)
Pt  Reports  He  Has  2  Symptoms  He  Has  A  Non  Productive  Cough      As      Well  As  Back  Pack        And           He  Also  Wet  His  Bed  Recently  During  The  Night             He  Reports  As  Well  Some  Dull l  Flank pain  Which  At this  Time  Is  Not  So  Severe      -   He  Also  Reports  He  Wants  To be  Checked  For  Diabetes

## 2013-11-16 NOTE — ED Provider Notes (Signed)
CSN: [id number]     Arrival date & time 11/16/13  [id number] History   First MD Initiated Contact with Patient 11/16/13 0914     Chief Complaint  Patient presents with  . Cough   (Consider location/radiation/quality/duration/timing/severity/associated sxs/prior Treatment) HPI Comments: 48 year old male is complaining of shortness of breath for one month and cough for 2 months. He states the cough is dry and nonproductive. He is also complaining of left low back pain that began a few days ago as well as chronic right low back pain. This pain is exacerbated by certain movements such as bending lifting. Occasionally the pain will radiate from the left lower most back/upper he had to the groin. He is also concerned that he had enuresis last night. This was his only a recurrence. A couple weeks ago he experienced hematospermia that has not noticed gross hematuria. History of renal stones for which he underwent ureteral extraction under anesthesia. He states the discomfort he is having today is not similar to the pain for which he had approximately 6 months ago. His other concern is that he may have diabetes. While he was in the hospital for his ureteral colic he was told that his fasting blood sugar was elevated and to have that followed up. Last week he went to obtain an A1c at a pharmacy but the machine was broken. When he learned that we did not have a bedside A1c worker here he declined to have a blood sugar drawn.    Past Medical History  Diagnosis Date  . History of kidney stones   . H/O hiatal hernia   . Chronic kidney disease   . Renal stones    Past Surgical History  Procedure Laterality Date  . Colon surgery  2003    COLON RESECTION FOR DIVERTICULITIS / COLOSTOMY  . Colostomy reversal  2003  . Appendectomy  2003    DONE AT TIME OF COLON RESECTION  . Hernia repair  2010  . Cholecystectomy  2010    AT SAME TIME OF HERNIA REPAIR  . 2007 or 2008  kidney stone removed    . 1984  knee  surgery    . Cystoscopy with ureteroscopy and stent placement Right 05/03/2013    Procedure: CYSTOSCOPY, RIGHT RETROGRADE PYELOGRAMRIGHT URETEROSCOPY, STONE EXTRACTION  AND STENT PLACEMENT;  Surgeon: Antony HasteMatthew Ramsey Eskridge, MD;  Location: [facility];  Service: Urology;  Laterality: Right;   History reviewed. No pertinent family history. History  Substance Use Topics  . Smoking status: Never Smoker   . Smokeless tobacco: Never Used  . Alcohol Use: No    Review of Systems  Constitutional: Positive for activity change. Negative for fever and appetite change.  HENT: Negative.   Respiratory: Positive for cough and shortness of breath.   Cardiovascular: Negative for chest pain, palpitations and leg swelling.  Gastrointestinal: Negative for nausea, vomiting, abdominal pain, diarrhea and constipation.  Genitourinary: Positive for enuresis. Negative for dysuria, urgency, frequency, flank pain, discharge, penile swelling, penile pain and testicular pain.  Musculoskeletal: Positive for back pain.  Skin: Negative.   Neurological: Negative.   Psychiatric/Behavioral: The patient is nervous/anxious.     Allergies  Review of patient's allergies indicates no known allergies.  Home Medications   Prior to Admission medications   Medication Sig Start Date End Date Taking? Authorizing Provider  ibuprofen (ADVIL,MOTRIN) 200 MG tablet Take 600 mg by mouth once.    Historical Provider, MD  oxyCODONE-acetaminophen (PERCOCET/ROXICET) 5-325 MG per tablet Take 1 tablet by mouth  every 4 (four) hours as needed for pain.    Historical Provider, MD  promethazine (PHENERGAN) 25 MG tablet Take 25 mg by mouth every 6 (six) hours as needed for nausea.    Historical Provider, MD  tamsulosin (FLOMAX) 0.4 MG CAPS capsule Take 0.4 mg by mouth daily.    Historical Provider, MD   BP 131/83  Pulse 92  Temp(Src) 98.6 F (37 C) (Oral)  Resp 18  SpO2 99% Physical Exam  Nursing note and vitals reviewed. Constitutional: He  is oriented to person, place, and time. He appears well-developed and well-nourished. No distress.  HENT:  Head: Normocephalic and atraumatic.  Mouth/Throat: No oropharyngeal exudate.  Bilat TM's nl OP with mild cobblestoning and clear PND  Eyes: Conjunctivae and EOM are normal.  Neck: Normal range of motion. Neck supple.  Cardiovascular: Normal rate, regular rhythm and normal heart sounds.   Pulmonary/Chest: Effort normal and breath sounds normal. No respiratory distress. He has no wheezes. He has no rales.  Abdominal: Soft. Bowel sounds are normal. He exhibits no mass. There is no tenderness. There is no rebound and no guarding.  Musculoskeletal: He exhibits no edema and no tenderness.  Lymphadenopathy:    He has no cervical adenopathy.  Neurological: He is alert and oriented to person, place, and time. He exhibits normal muscle tone.  Skin: Skin is warm and dry.  Psychiatric: He has a normal mood and affect.    ED Course  Procedures (including critical care time) Labs Review Labs Reviewed  POCT URINALYSIS DIP (DEVICE)    Imaging Review Dg Chest 2 View  11/16/2013   CLINICAL DATA:  Dyspnea and cough for 2 months.  EXAM: CHEST  2 VIEW  COMPARISON:  CT abdomen pelvis 02/06/2013  FINDINGS: Heart and mediastinal contours are within normal limits. Patient has a moderate to severe thoracolumbar scoliosis, partially visualized on chest radiograph. The patient is focally kyphotic cephalad to the thoracolumbar junction where there is a chronic wedge-shaped vertebral body, unchanged compared to prior abdomen pelvis CT 02/06/2013. The lungs appear normally expanded and are clear. No focal airspace opacities, pleural effusions, or pneumothorax. No definite interstitial abnormality appreciated by chest radiograph.  IMPRESSION: 1. No acute cardiopulmonary disease or interstitial abnormality appreciated by chest radiograph. 2. Moderate to severe thoracolumbar scoliosis and chronic associated kyphosis.    Electronically Signed   By: Britta MccreedySusan  Turner M.D.   On: 11/16/2013 10:10   Results for orders placed during the hospital encounter of 11/16/13  POCT URINALYSIS DIP (DEVICE)      Result Value Ref Range   Glucose, UA NEGATIVE  NEGATIVE mg/dL   Bilirubin Urine NEGATIVE  NEGATIVE   Ketones, ur NEGATIVE  NEGATIVE mg/dL   Specific Gravity, Urine 1.025  1.005 - 1.030   Hgb urine dipstick NEGATIVE  NEGATIVE   pH 5.5  5.0 - 8.0   Protein, ur NEGATIVE  NEGATIVE mg/dL   Urobilinogen, UA 0.2  0.0 - 1.0 mg/dL   Nitrite NEGATIVE  NEGATIVE   Leukocytes, UA NEGATIVE  NEGATIVE     MDM   1. Cough   2. PND (post-nasal drip)   3. Back pain, lumbosacral   4. Enuresis, nocturnal only    cheratussin AC Allegra 180 mg a day for drainage F/U with urologist if needed     Hayden Rasmussenavid Quilla Freeze, NP 11/16/13 1027

## 2013-11-16 NOTE — Discharge Instructions (Signed)
Back Pain, Adult Back pain is very common. The pain often gets better over time. The cause of back pain is usually not dangerous. Most people can learn to manage their back pain on their own.  HOME CARE   Stay active. Start with short walks on flat ground if you can. Try to walk farther each day.  Do not sit, drive, or stand in one place for more than 30 minutes. Do not stay in bed.  Do not avoid exercise or work. Activity can help your back heal faster.  Be careful when you bend or lift an object. Bend at your knees, keep the object close to you, and do not twist.  Sleep on a firm mattress. Lie on your side, and bend your knees. If you lie on your back, put a pillow under your knees.  Only take medicines as told by your doctor.  Put ice on the injured area.  Put ice in a plastic bag.  Place a towel between your skin and the bag.  Leave the ice on for 15-20 minutes, 03-04 times a day for the first 2 to 3 days. After that, you can switch between ice and heat packs.  Ask your doctor about back exercises or massage.  Avoid feeling anxious or stressed. Find good ways to deal with stress, such as exercise. GET HELP RIGHT AWAY IF:   Your pain does not go away with rest or medicine.  Your pain does not go away in 1 week.  You have new problems.  You do not feel well.  The pain spreads into your legs.  You cannot control when you poop (bowel movement) or pee (urinate).  Your arms or legs feel weak or lose feeling (numbness).  You feel sick to your stomach (nauseous) or throw up (vomit).  You have belly (abdominal) pain.  You feel like you may pass out (faint). MAKE SURE YOU:   Understand these instructions.  Will watch your condition.  Will get help right away if you are not doing well or get worse. Document Released: 12/03/2007 Document Revised: 09/08/2011 Document Reviewed: 11/04/2010 [facility] Patient Information 2014 [place], [place].  Cough, Adult Allegra 180 mg  a day for drainage and cough Cheratussin for cough  A cough is a reflex that helps clear your throat and airways. It can help heal the body or may be a reaction to an irritated airway. A cough may only last 2 or 3 weeks (acute) or may last more than 8 weeks (chronic).  CAUSES Acute cough:  Viral or bacterial infections. Chronic cough:  Infections.  Allergies.  Asthma.  Post-nasal drip.  Smoking.  Heartburn or acid reflux.  Some medicines.  Chronic lung problems (COPD).  Cancer. SYMPTOMS   Cough.  Fever.  Chest pain.  Increased breathing rate.  High-pitched whistling sound when breathing (wheezing).  Colored mucus that you cough up (sputum). TREATMENT   A bacterial cough may be treated with antibiotic medicine.  A viral cough must run its course and will not respond to antibiotics.  Your caregiver may recommend other treatments if you have a chronic cough. HOME CARE INSTRUCTIONS   Only take over-the-counter or prescription medicines for pain, discomfort, or fever as directed by your caregiver. Use cough suppressants only as directed by your caregiver.  Use a cold steam vaporizer or humidifier in your bedroom or home to help loosen secretions.  Sleep in a semi-upright position if your cough is worse at night.  Rest as needed.  Stop  smoking if you smoke. SEEK IMMEDIATE MEDICAL CARE IF:   You have pus in your sputum.  Your cough starts to worsen.  You cannot control your cough with suppressants and are losing sleep.  You begin coughing up blood.  You have difficulty breathing.  You develop pain which is getting worse or is uncontrolled with medicine.  You have a fever. MAKE SURE YOU:   Understand these instructions.  Will watch your condition.  Will get help right away if you are not doing well or get worse. Document Released: 12/13/2010 Document Revised: 09/08/2011 Document Reviewed: 12/13/2010 [facility] Patient Information 2014 [place],  [place].  Lumbosacral Strain Lumbosacral strain is a strain of any of the parts that make up your lumbosacral vertebrae. Your lumbosacral vertebrae are the bones that make up the lower third of your backbone. Your lumbosacral vertebrae are held together by muscles and tough, fibrous tissue (ligaments).  CAUSES  A sudden blow to your back can cause lumbosacral strain. Also, anything that causes an excessive stretch of the muscles in the low back can cause this strain. This is typically seen when people exert themselves strenuously, fall, lift heavy objects, bend, or crouch repeatedly. RISK FACTORS  Physically demanding work.  Participation in pushing or pulling sports or sports that require sudden twist of the back (tennis, golf, baseball).  Weight lifting.  Excessive lower back curvature.  Forward-tilted pelvis.  Weak back or abdominal muscles or both.  Tight hamstrings. SIGNS AND SYMPTOMS  Lumbosacral strain may cause pain in the area of your injury or pain that moves (radiates) down your leg.  DIAGNOSIS Your health care provider can often diagnose lumbosacral strain through a physical exam. In some cases, you may need tests such as X-ray exams.  TREATMENT  Treatment for your lower back injury depends on many factors that your clinician will have to evaluate. However, most treatment will include the use of anti-inflammatory medicines. HOME CARE INSTRUCTIONS   Avoid hard physical activities (tennis, racquetball, waterskiing) if you are not in proper physical condition for it. This may aggravate or create problems.  If you have a back problem, avoid sports requiring sudden body movements. Swimming and walking are generally safer activities.  Maintain good posture.  Maintain a healthy weight.  For acute conditions, you may put ice on the injured area.  Put ice in a plastic bag.  Place a towel between your skin and the bag.  Leave the ice on for 20 minutes, 2 3 times a  day.  When the low back starts healing, stretching and strengthening exercises may be recommended. SEEK MEDICAL CARE IF:  Your back pain is getting worse.  You experience severe back pain not relieved with medicines. SEEK IMMEDIATE MEDICAL CARE IF:   You have numbness, tingling, weakness, or problems with the use of your arms or legs.  There is a change in bowel or bladder control.  You have increasing pain in any area of the body, including your belly (abdomen).  You notice shortness of breath, dizziness, or feel faint.  You feel sick to your stomach (nauseous), are throwing up (vomiting), or become sweaty.  You notice discoloration of your toes or legs, or your feet get very cold. MAKE SURE YOU:   Understand these instructions.  Will watch your condition.  Will get help right away if you are not doing well or get worse. Document Released: 03/26/2005 Document Revised: 04/06/2013 Document Reviewed: 02/02/2013 [facility] Patient Information 2014 [place], [place].  Urinary Incontinence Urinary incontinence is  the involuntary loss of urine from your bladder. CAUSES  There are many causes of urinary incontinence. They include:  Medicines.  Infections.  Prostatic enlargement, leading to overflow of urine from your bladder.  Surgery.  Neurological diseases.  Emotional factors. SIGNS AND SYMPTOMS Urinary Incontinence can be divided into four types: 1. Urge incontinence. Urge incontinence is the involuntary loss of urine before you have the opportunity to go to the bathroom. There is a sudden urge to void but not enough time to reach a bathroom. 2. Stress incontinence. Stress incontinence is the sudden loss of urine with any activity that forces urine to pass. It is commonly caused by anatomical changes to the pelvis and sphincter areas of your body. 3. Overflow incontinence. Overflow incontinence is the loss of urine from an obstructed opening to your bladder. This results in  a backup of urine and a resultant buildup of pressure within the bladder. When the pressure within the bladder exceeds the closing pressure of the sphincter, the urine overflows, which causes incontinence, similar to water overflowing a dam. 4. Total incontinence. Total incontinence is the loss of urine as a result of the inability to store urine within your bladder. DIAGNOSIS  Evaluating the cause of incontinence may require:  A thorough and complete medical and obstetric history.  A complete physical exam.  Laboratory tests such as a urine culture and sensitivities. When additional tests are indicated, they can include:  An ultrasound exam.  Kidney and bladder X-rays.  Cystoscopy. This is an exam of the bladder using a narrow scope.  Urodynamic testing to test the nerve function to the bladder and sphincter areas. TREATMENT  Treatment for urinary incontinence depends on the cause:  For urge incontinence caused by a bacterial infection, antibiotics will be prescribed. If the urge incontinence is related to medicines you take, your health care provider may have you change the medicine.  For stress incontinence, surgery to re-establish anatomical support to the bladder or sphincter, or both, will often correct the condition.  For overflow incontinence caused by an enlarged prostate, an operation to open the channel through the enlarged prostate will allow the flow of urine out of the bladder. In women with fibroids, a hysterectomy may be recommended.  For total incontinence, surgery on your urinary sphincter may help. An artificial urinary sphincter (an inflatable cuff placed around the urethra) may be required. In women who have developed a hole-like passage between their bladder and vagina (vesicovaginal fistula), surgery to close the fistula often is required. HOME CARE INSTRUCTIONS  Normal daily hygiene and the use of pads or adult diapers that are changed regularly will help  prevent odors and skin damage.  Avoid caffeine. It can overstimulate your bladder.  Use the bathroom regularly. Try about every 2 3 hours to go to the bathroom, even if you do not feel the need to do so. Take time to empty your bladder completely. After urinating, wait a minute. Then try to urinate again.  For causes involving nerve dysfunction, keep a log of the medicines you take and a journal of the times you go to the bathroom. SEEK MEDICAL CARE IF:  You experience worsening of pain instead of improvement in pain after your procedure.  Your incontinence becomes worse instead of better. SEE IMMEDIATE MEDICAL CARE IF:  You experience fever or shaking chills.  You are unable to pass your urine.  You have redness spreading into your groin or down into your thighs. MAKE SURE YOU:  Understand these instructions.   Will watch your condition.  Will get help right away if you are not doing well or get worse. Document Released: 07/24/2004 Document Revised: 04/06/2013 Document Reviewed: 11/23/2012 [facility] Patient Information 2014 [place]Care, [place].

## 2013-11-17 NOTE — ED Provider Notes (Signed)
Medical screening examination/treatment/procedure(s) were performed by a resident physician or non-physician practitioner and as the supervising physician I was immediately available for consultation/collaboration.  Rakan Soffer, MD    Yamaira Spinner S Kambri Dismore, MD 11/17/13 0741 

## 2014-08-26 ENCOUNTER — Emergency Department (HOSPITAL_COMMUNITY): Payer: Self-pay

## 2014-08-26 ENCOUNTER — Encounter (HOSPITAL_COMMUNITY): Payer: Self-pay | Admitting: Emergency Medicine

## 2014-08-26 ENCOUNTER — Emergency Department (HOSPITAL_COMMUNITY)
Admission: EM | Admit: 2014-08-26 | Discharge: 2014-08-26 | Disposition: A | Discharge status: Home / Self Care | Payer: Self-pay | Attending: Emergency Medicine | Admitting: Emergency Medicine

## 2014-08-26 DIAGNOSIS — N189 Chronic kidney disease, unspecified: Secondary | ICD-10-CM | POA: Insufficient documentation

## 2014-08-26 DIAGNOSIS — Z79899 Other long term (current) drug therapy: Secondary | ICD-10-CM | POA: Insufficient documentation

## 2014-08-26 DIAGNOSIS — J209 Acute bronchitis, unspecified: Secondary | ICD-10-CM | POA: Insufficient documentation

## 2014-08-26 DIAGNOSIS — Z8719 Personal history of other diseases of the digestive system: Secondary | ICD-10-CM | POA: Insufficient documentation

## 2014-08-26 DIAGNOSIS — Z87442 Personal history of urinary calculi: Secondary | ICD-10-CM | POA: Insufficient documentation

## 2014-08-26 LAB — BASIC METABOLIC PANEL
ANION GAP: 7 (ref 5–15)
BUN: 11 mg/dL (ref 6–23)
CO2: 23 mmol/L (ref 19–32)
Calcium: 8.7 mg/dL (ref 8.4–10.5)
Chloride: 106 mmol/L (ref 96–112)
Creatinine, Ser: 0.77 mg/dL (ref 0.50–1.35)
GFR calc Af Amer: >90 mL/min (ref >90)
GFR calc non Af Amer: >90 mL/min (ref >90)
Glucose, Bld: 126 mg/dL — ABNORMAL HIGH (ref 70–99)
Potassium: 3.7 mmol/L (ref 3.5–5.1)
Sodium: 136 mmol/L (ref 135–145)

## 2014-08-26 LAB — CBC
HCT: 43.9 % (ref 39.0–52.0)
Hemoglobin: 14.6 g/dL (ref 13.0–17.0)
MCH: 27.9 pg (ref 26.0–34.0)
MCHC: 33.3 g/dL (ref 30.0–36.0)
MCV: 83.9 fL (ref 78.0–100.0)
Platelets: 357 10*3/uL (ref 150–400)
RBC: 5.23 MIL/uL (ref 4.22–5.81)
RDW: 13.8 % (ref 11.5–15.5)
WBC: 10.5 10*3/uL (ref 4.0–10.5)

## 2014-08-26 LAB — I-STAT TROPONIN, ED: Troponin i, poc: 0 ng/mL (ref 0.00–0.08)

## 2014-08-26 LAB — BRAIN NATRIURETIC PEPTIDE: B Natriuretic Peptide: 35.7 pg/mL (ref 0.0–100.0)

## 2014-08-26 MED ORDER — FLUTICASONE PROPIONATE 50 MCG/ACT NA SUSP: 2.0000 | Freq: Every day | NASAL | Status: DC

## 2014-08-26 MED ORDER — DM-GUAIFENESIN ER 30-600 MG PO TB12
1.0000 | ORAL_TABLET | Freq: Two times a day (BID) | ORAL | Status: DC
Start: 2014-08-26 — End: 2020-01-30

## 2014-08-26 MED ORDER — PSEUDOEPHEDRINE HCL ER 120 MG PO TB12: 120.0000 mg | ORAL_TABLET | Freq: Two times a day (BID) | ORAL | Status: DC

## 2014-08-26 NOTE — ED Provider Notes (Signed)
CSN: [id number]     Arrival date & time 08/26/14  0446 History   First MD Initiated Contact with Patient 08/26/14 0559     Chief Complaint  Patient presents with  . Shortness of Breath   Tyrone Fernandez is a 48 y.o. male with a history of chronic kidney disease who presents to the ED complaining of a productive cough, nasal congestion, postnasal drip and chills for the past 4 days. He reports feeling like he can't breathe when falling asleep on his back. He reports having lots of postnasal drip. Patient denies having any chest pain and denies currently feeling short of breath. Patient reports he's been eating and drinking normally. Patient reports taking a few pills of amoxicillin yesterday. Advised not to do this. He does report a slight sore throat but denies any trouble swallowing or eating. The patient is not a smoker and does not have diabetes. The patient denies history of pneumonia and bronchitis. Patient has history of asthma or COPD. Patient denies fevers, trouble swallowing, ear pain, eye pain, sick contacts, dental pain, nausea, vomiting, diarrhea, or weakness.  He denies current shortness of breath.   (Consider location/radiation/quality/duration/timing/severity/associated sxs/prior Treatment) HPI  Past Medical History  Diagnosis Date  . History of kidney stones   . H/O hiatal hernia   . Chronic kidney disease   . Renal stones    Past Surgical History  Procedure Laterality Date  . Colon surgery  2003    COLON RESECTION FOR DIVERTICULITIS / COLOSTOMY  . Colostomy reversal  2003  . Appendectomy  2003    DONE AT TIME OF COLON RESECTION  . Hernia repair  2010  . Cholecystectomy  2010    AT SAME TIME OF HERNIA REPAIR  . 2007 or 2008  kidney stone removed    . 1984  knee surgery    . Cystoscopy with ureteroscopy and stent placement Right 05/03/2013    Procedure: CYSTOSCOPY, RIGHT RETROGRADE PYELOGRAMRIGHT URETEROSCOPY, STONE EXTRACTION  AND STENT PLACEMENT;  Surgeon: Antony Haste, MD;  Location: [facility];  Service: Urology;  Laterality: Right;   No family history on file. History  Substance Use Topics  . Smoking status: Never Smoker   . Smokeless tobacco: Never Used  . Alcohol Use: No    Review of Systems  Constitutional: Positive for chills. Negative for fever, activity change and appetite change.  HENT: Positive for congestion, postnasal drip, rhinorrhea, sneezing and sore throat. Negative for ear discharge, ear pain, mouth sores, nosebleeds, sinus pressure and trouble swallowing.   Eyes: Negative for pain and visual disturbance.  Respiratory: Positive for cough and shortness of breath. Negative for apnea, choking, chest tightness and wheezing.   Cardiovascular: Negative for chest pain, palpitations and leg swelling.  Gastrointestinal: Negative for nausea, vomiting, abdominal pain and diarrhea.  Genitourinary: Negative for dysuria and difficulty urinating.  Musculoskeletal: Negative for back pain and neck pain.  Skin: Negative for rash and wound.  Neurological: Negative for weakness, light-headedness and headaches.      Allergies  Review of patient's allergies indicates no known allergies.  Home Medications   Prior to Admission medications   Medication Sig Start Date End Date Taking? Authorizing Provider  dextromethorphan-guaiFENesin (MUCINEX DM) 30-600 MG per 12 hr tablet Take 1 tablet by mouth 2 (two) times daily. 08/26/14   Einar Gip Suad Autrey, PA-C  fluticasone (FLONASE) 50 MCG/ACT nasal spray Place 2 sprays into both nostrils daily. 08/26/14   Einar Gip Bradleigh Sonnen, PA-C  guaiFENesin-codeine [facility])  100-10 MG/5ML syrup Take 5 mLs by mouth every 4 (four) hours as needed for cough or congestion. Patient not taking: Reported on 08/26/2014 11/16/13   Hayden Rasmussen, NP  ibuprofen (ADVIL,MOTRIN) 200 MG tablet Take 600 mg by mouth once.    Historical Provider, MD  oxyCODONE-acetaminophen (PERCOCET/ROXICET) 5-325 MG per tablet Take 1  tablet by mouth every 4 (four) hours as needed for pain.    Historical Provider, MD  promethazine (PHENERGAN) 25 MG tablet Take 25 mg by mouth every 6 (six) hours as needed for nausea.    Historical Provider, MD  pseudoephedrine (SUDAFED 12 HOUR) 120 MG 12 hr tablet Take 1 tablet (120 mg total) by mouth 2 (two) times daily. 08/26/14   Einar Gip Kamarri Lovvorn, PA-C  tamsulosin (FLOMAX) 0.4 MG CAPS capsule Take 0.4 mg by mouth daily.    Historical Provider, MD   BP 131/76 mmHg  Pulse 98  Temp(Src) 98.5 F (36.9 C) (Oral)  Resp 11  Ht  (1.778 m)  Wt 294 lb (133.358 kg)  BMI 42.18 kg/m2  SpO2 95% Physical Exam  Constitutional: He is oriented to person, place, and time. He appears well-developed and well-nourished. No distress.  HENT:  Head: Normocephalic and atraumatic.  Right Ear: External ear normal.  Left Ear: External ear normal.  Nose: Mucosal edema and rhinorrhea present. No sinus tenderness. Right sinus exhibits no maxillary sinus tenderness and no frontal sinus tenderness. Left sinus exhibits no maxillary sinus tenderness and no frontal sinus tenderness.  Mouth/Throat: Oropharynx is clear and moist. No oropharyngeal exudate.  Bilateral tympanic membranes are pearly-gray without erythema or loss of landmarks. Tonsillar hypertrophy without exudate. No evidence of tonsillar abscess. Uvula is midline without edema. Evidence of post nasal drip.   Eyes: Conjunctivae are normal. Pupils are equal, round, and reactive to light. Right eye exhibits no discharge. Left eye exhibits no discharge.  Neck: Normal range of motion. Neck supple. No JVD present. No tracheal deviation present.  Cardiovascular: Normal rate, regular rhythm, normal heart sounds and intact distal pulses.  Exam reveals no gallop and no friction rub.   No murmur heard. HR is 92  Pulmonary/Chest: Effort normal and breath sounds normal. No respiratory distress. He has no wheezes. He has no rales.  Lungs clear to ascultation  bilaterally.   Abdominal: Soft. Bowel sounds are normal. He exhibits no distension. There is no tenderness.  Musculoskeletal: He exhibits no edema.  Lymphadenopathy:    He has no cervical adenopathy.  Neurological: He is alert and oriented to person, place, and time. Coordination normal.  Skin: Skin is warm and dry. No rash noted. He is not diaphoretic. No erythema. No pallor.  Psychiatric: He has a normal mood and affect. His behavior is normal.  Nursing note and vitals reviewed.   ED Course  Procedures (including critical care time) Labs Review Labs Reviewed  BASIC METABOLIC PANEL - Abnormal; Notable for the following:    Glucose, Bld 126 (*)    All other components within normal limits  CBC  BRAIN NATRIURETIC PEPTIDE  I-STAT TROPOININ, ED    Imaging Review Dg Chest 2 View  08/26/2014   CLINICAL DATA:  Shortness of breath. Unable to sleep for 4 days, congestion.  EXAM: CHEST  2 VIEW  COMPARISON:  Chest radiograph Nov 16, 2013  FINDINGS: The cardiac silhouette appears mildly enlarged, similar. Mediastinal silhouette is nonsuspicious. Mild bronchitic changes. Bibasilar strandy densities. Trachea projects midline and there is no pneumothorax. 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 Surgical clips in the included right abdomen likely reflect cholecystectomy.  IMPRESSION: Mild cardiomegaly and bibasilar atelectasis. Mild bronchitic changes.   Electronically Signed   By: Awilda Metroourtnay  Bloomer   On: 08/26/2014 05:55     EKG Interpretation   Date/Time:  Saturday August 26 2014 04:51:37 EST Ventricular Rate:  115 PR Interval:  148 QRS Duration: 74 QT Interval:  320 QTC Calculation: 442 R Axis:   -60 Text Interpretation:  Sinus tachycardia Left axis deviation Abnormal ECG  Confirmed by OTTER  MD, OLGA ([id number]) on 08/26/2014 6:21:22 AM      Filed Vitals:   08/26/14 0455 08/26/14 0551 08/26/14 0635  BP: 142/79 129/76 131/76  Pulse: 118 107  98  Temp: 98.2 F (36.8 C) 98.5 F (36.9 C)   TempSrc: Oral Oral   Resp: 16 16 11   Height: 5\' 10"  (1.778 m)    Weight: 294 lb (133.358 kg)    SpO2: 97% 92% 95%     MDM   Meds given in ED:  Medications - No data to display  Discharge Medication List as of 08/26/2014  6:25 AM    START taking these medications   Details  dextromethorphan-guaiFENesin (MUCINEX DM) 30-600 MG per 12 hr tablet Take 1 tablet by mouth 2 (two) times daily., Starting 08/26/2014, Until Discontinued, Print    fluticasone (FLONASE) 50 MCG/ACT nasal spray Place 2 sprays into both nostrils daily., Starting 08/26/2014, Until Discontinued, Print    pseudoephedrine (SUDAFED 12 HOUR) 120 MG 12 hr tablet Take 1 tablet (120 mg total) by mouth 2 (two) times daily., Starting 08/26/2014, Until Discontinued, Print        Final diagnoses:  Acute bronchitis, unspecified organism   This is a 49 y.o. male with a history of chronic kidney disease who presents to the ED complaining of a productive cough, nasal congestion, postnasal drip and chills for the past 4 days. He reports feeling like he can't breathe when falling asleep on his back. He reports having lots of postnasal drip. Patient denies having any chest pain and denies currently feeling short of breath. Patient is afebrile and nontoxic-appearing. Patient is not tachypneic or hypoxic. His HR is 92. He has lots of nasal congestion. No sinus tenderness. Patient has tonsillar hypertrophy without exudates. No signs of tonsillar abscess. His troponin is negative. CBC and BMP are unremarkable. His BNP is unremarkable. Chest x-ray shows mild cardiomegaly and bibasilar atelectasis. There is mild bronchitic changes. Patient's exam and history consistent with bronchitis. Education provided on symptomatically treatment of bronchitis. Patient prescriptions for Mucinex DM, Flonase and Sudafed. I suggested the patient sleep in his recliner for with multiple pillows to help with his  breathing until he feels better. I encouraged lots of fluids. I advised the patient to follow-up with their primary care provider this week. I advised the patient to return to the emergency department with new or worsening symptoms or new concerns. The patient verbalized understanding and agreement with plan.   This patient was discussed with Dr. Norlene Campbelltter who agrees with assessment and plan.     Lawana ChambersWilliam Duncan Vasili Fok, PA-C 08/26/14 [phone]  Olivia Mackielga M Otter, MD 08/27/14 [phone]

## 2014-08-26 NOTE — ED Notes (Addendum)
Pt states he feels like he stops breathing every time he tries to go to sleep.  Reports unable to sleep for the past 4 days.  Denies chest pain.  States he was initially congested at the beginning of the week but that has resolved with nasal sprays.

## 2014-08-26 NOTE — Discharge Instructions (Signed)

## 2014-09-03 ENCOUNTER — Other Ambulatory Visit: Payer: Self-pay

## 2014-09-03 ENCOUNTER — Emergency Department (HOSPITAL_COMMUNITY)
Admission: EM | Admit: 2014-09-03 | Discharge: 2014-09-03 | Disposition: A | Discharge status: Home / Self Care | Payer: Self-pay | Attending: Emergency Medicine | Admitting: Emergency Medicine

## 2014-09-03 ENCOUNTER — Emergency Department (HOSPITAL_COMMUNITY): Payer: Self-pay

## 2014-09-03 ENCOUNTER — Encounter (HOSPITAL_COMMUNITY): Payer: Self-pay | Admitting: *Deleted

## 2014-09-03 DIAGNOSIS — Z7951 Long term (current) use of inhaled steroids: Secondary | ICD-10-CM | POA: Insufficient documentation

## 2014-09-03 DIAGNOSIS — Z791 Long term (current) use of non-steroidal anti-inflammatories (NSAID): Secondary | ICD-10-CM | POA: Insufficient documentation

## 2014-09-03 DIAGNOSIS — J208 Acute bronchitis due to other specified organisms: Secondary | ICD-10-CM

## 2014-09-03 DIAGNOSIS — Z7952 Long term (current) use of systemic steroids: Secondary | ICD-10-CM | POA: Insufficient documentation

## 2014-09-03 DIAGNOSIS — Z87442 Personal history of urinary calculi: Secondary | ICD-10-CM | POA: Insufficient documentation

## 2014-09-03 DIAGNOSIS — Z8719 Personal history of other diseases of the digestive system: Secondary | ICD-10-CM | POA: Insufficient documentation

## 2014-09-03 DIAGNOSIS — R000 Tachycardia, unspecified: Secondary | ICD-10-CM | POA: Insufficient documentation

## 2014-09-03 DIAGNOSIS — Z79899 Other long term (current) drug therapy: Secondary | ICD-10-CM | POA: Insufficient documentation

## 2014-09-03 DIAGNOSIS — E669 Obesity, unspecified: Secondary | ICD-10-CM | POA: Insufficient documentation

## 2014-09-03 DIAGNOSIS — J209 Acute bronchitis, unspecified: Secondary | ICD-10-CM | POA: Insufficient documentation

## 2014-09-03 DIAGNOSIS — N189 Chronic kidney disease, unspecified: Secondary | ICD-10-CM | POA: Insufficient documentation

## 2014-09-03 LAB — BASIC METABOLIC PANEL
Anion gap: 7 (ref 5–15)
BUN: 11 mg/dL (ref 6–23)
CHLORIDE: 104 mmol/L (ref 96–112)
CO2: 28 mmol/L (ref 19–32)
Calcium: 9 mg/dL (ref 8.4–10.5)
Creatinine, Ser: 0.92 mg/dL (ref 0.50–1.35)
GFR calc non Af Amer: >90 mL/min (ref >90)
Glucose, Bld: 106 mg/dL — ABNORMAL HIGH (ref 70–99)
Potassium: 3.7 mmol/L (ref 3.5–5.1)
SODIUM: 139 mmol/L (ref 135–145)

## 2014-09-03 LAB — CBC
HCT: 40.5 % (ref 39.0–52.0)
Hemoglobin: 13.4 g/dL (ref 13.0–17.0)
MCH: 27.5 pg (ref 26.0–34.0)
MCHC: 33.1 g/dL (ref 30.0–36.0)
MCV: 83.2 fL (ref 78.0–100.0)
PLATELETS: 366 10*3/uL (ref 150–400)
RBC: 4.87 MIL/uL (ref 4.22–5.81)
RDW: 13.6 % (ref 11.5–15.5)
WBC: 13 10*3/uL — ABNORMAL HIGH (ref 4.0–10.5)

## 2014-09-03 LAB — TROPONIN I: Troponin I: <0.03 ng/mL (ref <0.031)

## 2014-09-03 LAB — D-DIMER, QUANTITATIVE: D-Dimer, Quant: 0.48 ug/mL-FEU (ref 0.00–0.48)

## 2014-09-03 LAB — BRAIN NATRIURETIC PEPTIDE: B Natriuretic Peptide: 43.8 pg/mL (ref 0.0–100.0)

## 2014-09-03 MED ORDER — IPRATROPIUM-ALBUTEROL 0.5-2.5 (3) MG/3ML IN SOLN
3.0000 mL | RESPIRATORY_TRACT | Status: DC
  Administered 2014-09-03: 3 mL via RESPIRATORY_TRACT
  Filled 2014-09-03: qty 3

## 2014-09-03 MED ORDER — IPRATROPIUM-ALBUTEROL 0.5-2.5 (3) MG/3ML IN SOLN
3.0000 mL | Freq: Once | RESPIRATORY_TRACT | Status: AC
  Administered 2014-09-03: 3 mL via RESPIRATORY_TRACT
  Filled 2014-09-03: qty 3

## 2014-09-03 MED ORDER — ALBUTEROL SULFATE HFA 108 (90 BASE) MCG/ACT IN AERS: 1.0000 | INHALATION_SPRAY | Freq: Four times a day (QID) | RESPIRATORY_TRACT | Status: DC | PRN

## 2014-09-03 MED ORDER — PREDNISONE 20 MG PO TABS: ORAL_TABLET | ORAL | Status: DC

## 2014-09-03 NOTE — ED Notes (Signed)
Pt placed into gown and on monitor upon arrival to room. Pt monitored by blood pressure, pulse ox, and 5 lead. pts family remains at bedside.  

## 2014-09-03 NOTE — ED Notes (Signed)
Unknown temp

## 2014-09-03 NOTE — ED Notes (Signed)
The pt was seen here last Saturday and dx with bronchitis.  Since then his cough has worsened  And he has a sinus infection.  He feels like he has more difficulty breathing with exertion since then.

## 2014-09-03 NOTE — ED Notes (Signed)
Pt provided with with meal per Melony OverlyKuch RN. Pt remains monitored by blood pressure, pulse ox, and 5 lead. pts family remains at bedside.

## 2014-09-03 NOTE — Discharge Instructions (Signed)
Acute Bronchitis Bronchitis is inflammation of the airways that extend from the windpipe into the lungs (bronchi). The inflammation often causes mucus to develop. This leads to a cough, which is the most common symptom of bronchitis.  In acute bronchitis, the condition usually develops suddenly and goes away over time, usually in a couple weeks. Smoking, allergies, and asthma can make bronchitis worse. Repeated episodes of bronchitis may cause further lung problems.  CAUSES Acute bronchitis is most often caused by the same virus that causes a cold. The virus can spread from person to person (contagious) through coughing, sneezing, and touching contaminated objects. SIGNS AND SYMPTOMS   Cough.   Fever.   Coughing up mucus.   Body aches.   Chest congestion.   Chills.   Shortness of breath.   Sore throat.  DIAGNOSIS  Acute bronchitis is usually diagnosed through a physical exam. Your health care provider will also ask you questions about your medical history. Tests, such as chest X-rays, are sometimes done to rule out other conditions.  TREATMENT  Acute bronchitis usually goes away in a couple weeks. Oftentimes, no medical treatment is necessary. Medicines are sometimes given for relief of fever or cough. Antibiotic medicines are usually not needed but may be prescribed in certain situations. In some cases, an inhaler may be recommended to help reduce shortness of breath and control the cough. A cool mist vaporizer may also be used to help thin bronchial secretions and make it easier to clear the chest.  HOME CARE INSTRUCTIONS  Get plenty of rest.   Drink enough fluids to keep your urine clear or pale yellow (unless you have a medical condition that requires fluid restriction). Increasing fluids may help thin your respiratory secretions (sputum) and reduce chest congestion, and it will prevent dehydration.   Take medicines only as directed by your health care provider.  If  you were prescribed an antibiotic medicine, finish it all even if you start to feel better.  Avoid smoking and secondhand smoke. Exposure to cigarette smoke or irritating chemicals will make bronchitis worse. If you are a smoker, consider using nicotine gum or skin patches to help control withdrawal symptoms. Quitting smoking will help your lungs heal faster.   Reduce the chances of another bout of acute bronchitis by washing your hands frequently, avoiding people with cold symptoms, and trying not to touch your hands to your mouth, nose, or eyes.   Keep all follow-up visits as directed by your health care provider.  SEEK MEDICAL CARE IF: Your symptoms do not improve after 1 week of treatment.  SEEK IMMEDIATE MEDICAL CARE IF:  You develop an increased fever or chills.   You have chest pain.   You have severe shortness of breath.  You have bloody sputum.   You develop dehydration.  You faint or repeatedly feel like you are going to pass out.  You develop repeated vomiting.  You develop a severe headache. MAKE SURE YOU:   Understand these instructions.  Will watch your condition.  Will get help right away if you are not doing well or get worse. Document Released: 07/24/2004 Document Revised: 10/31/2013 Document Reviewed: 12/07/2012 [facility] Patient Information 2015 [employer], [place]. This information is not intended to replace advice given to you by your health care provider. Make sure you discuss any questions you have with your health care provider.  Viral Infections A viral infection can be caused by different types of viruses.Most viral infections are not serious and resolve on their own.  However, some infections may cause severe symptoms and may lead to further complications. SYMPTOMS Viruses can frequently cause:  Minor sore throat.  Aches and pains.  Headaches.  Runny nose.  Different types of rashes.  Watery eyes.  Tiredness.  Cough.  Loss of  appetite.  Gastrointestinal infections, resulting in nausea, vomiting, and diarrhea. These symptoms do not respond to antibiotics because the infection is not caused by bacteria. However, you might catch a bacterial infection following the viral infection. This is sometimes called a "superinfection." Symptoms of such a bacterial infection may include:  Worsening sore throat with pus and difficulty swallowing.  Swollen neck glands.  Chills and a high or persistent fever.  Severe headache.  Tenderness over the sinuses.  Persistent overall ill feeling (malaise), muscle aches, and tiredness (fatigue).  Persistent cough.  Yellow, green, or brown mucus production with coughing. HOME CARE INSTRUCTIONS   Only take over-the-counter or prescription medicines for pain, discomfort, diarrhea, or fever as directed by your caregiver.  Drink enough water and fluids to keep your urine clear or pale yellow. Sports drinks can provide valuable electrolytes, sugars, and hydration.  Get plenty of rest and maintain proper nutrition. Soups and broths with crackers or rice are fine. SEEK IMMEDIATE MEDICAL CARE IF:   You have severe headaches, shortness of breath, chest pain, neck pain, or an unusual rash.  You have uncontrolled vomiting, diarrhea, or you are unable to keep down fluids.  You or your child has an oral temperature above 102 F (38.9 C), not controlled by medicine.  Your baby is older than 3 months with a rectal temperature of 102 F (38.9 C) or higher.  Your baby is 933 months old or younger with a rectal temperature of 100.4 F (38 C) or higher. MAKE SURE YOU:   Understand these instructions.  Will watch your condition.  Will get help right away if you are not doing well or get worse. Document Released: 03/26/2005 Document Revised: 09/08/2011 Document Reviewed: 10/21/2010 [facility] Patient Information 2015 [place], [place]. This information is not intended to replace advice given  to you by your health care provider. Make sure you discuss any questions you have with your health care provider.  Your evaluation in the ED today did not show any emergent causes for your symptoms at this time. It is important for you to follow-up with your primary care for further evaluation and management of your symptoms. Please take your medications as prescribed to help with your shortness of breath and cough. Return to ED for new or worsening symptoms

## 2014-09-03 NOTE — ED Provider Notes (Signed)
CSN: [id number]     Arrival date & time 09/03/14  1735 History   First MD Initiated Contact with Patient 09/03/14 1846     Chief Complaint  Patient presents with  . Bronchitis     (Consider location/radiation/quality/duration/timing/severity/associated sxs/prior Treatment) HPI Tyrone Fernandez is a 49 y.o. male who comes in for evaluation of dry, nonproductive cough, shortness of breath. Patient states he was seen in the ED approximately one week ago and diagnosed with bronchitis. Reports he was discharged with Flonase, Mucinex and Sudafed which helped with his congestion and runny nose. However, it did not help with his cough or shortness of breath. He reports associated chest discomfort when he coughs, none at rest. Reports the coughing makes it difficult for him to breathe which causes him to become intermittently short of breath. Denies any fevers, overt chest pain, leg swelling, recent surgeries or travels, personal history of cancer, blood clots. No other aggravating or modifying factors.  Past Medical History  Diagnosis Date  . History of kidney stones   . H/O hiatal hernia   . Chronic kidney disease   . Renal stones    Past Surgical History  Procedure Laterality Date  . Colon surgery  2003    COLON RESECTION FOR DIVERTICULITIS / COLOSTOMY  . Colostomy reversal  2003  . Appendectomy  2003    DONE AT TIME OF COLON RESECTION  . Hernia repair  2010  . Cholecystectomy  2010    AT SAME TIME OF HERNIA REPAIR  . 2007 or 2008  kidney stone removed    . 1984  knee surgery    . Cystoscopy with ureteroscopy and stent placement Right 05/03/2013    Procedure: CYSTOSCOPY, RIGHT RETROGRADE PYELOGRAMRIGHT URETEROSCOPY, STONE EXTRACTION  AND STENT PLACEMENT;  Surgeon: Antony HasteMatthew Ramsey Eskridge, MD;  Location: [facility];  Service: Urology;  Laterality: Right;   No family history on file. History  Substance Use Topics  . Smoking status: Never Smoker   . Smokeless tobacco: Never Used  . Alcohol Use:  No    Review of Systems A 10 point review of systems was completed and was negative except for pertinent positives and negatives as mentioned in the history of present illness     Allergies  Review of patient's allergies indicates no known allergies.  Home Medications   Prior to Admission medications   Medication Sig Start Date End Date Taking? Authorizing Provider  dextromethorphan-guaiFENesin (MUCINEX DM) 30-600 MG per 12 hr tablet Take 1 tablet by mouth 2 (two) times daily. 08/26/14  Yes Einar GipWilliam Duncan Dansie, PA-C  fluticasone (FLONASE) 50 MCG/ACT nasal spray Place 2 sprays into both nostrils daily. 08/26/14  Yes Einar GipWilliam Duncan Dansie, PA-C  ibuprofen (ADVIL,MOTRIN) 200 MG tablet Take 600 mg by mouth once.   Yes Historical Provider, MD  pseudoephedrine (SUDAFED 12 HOUR) 120 MG 12 hr tablet Take 1 tablet (120 mg total) by mouth 2 (two) times daily. 08/26/14  Yes Einar GipWilliam Duncan Dansie, PA-C  albuterol (PROVENTIL HFA;VENTOLIN HFA) 108 (90 BASE) MCG/ACT inhaler Inhale 1-2 puffs into the lungs every 6 (six) hours as needed for wheezing or shortness of breath. 09/03/14   Sharlene MottsBenjamin W Juanitta Earnhardt, PA-C  predniSONE (DELTASONE) 20 MG tablet 3 tabs po day one, then 2 tabs daily x 4 days 09/03/14   Earle GellBenjamin W Deangela Randleman, PA-C  promethazine (PHENERGAN) 25 MG tablet Take 25 mg by mouth every 6 (six) hours as needed for nausea.    Historical Provider, MD  tamsulosin (FLOMAX) 0.4 MG  CAPS capsule Take 0.4 mg by mouth daily.    Historical Provider, MD   BP 133/84 mmHg  Pulse 95  Temp(Src) 98.3 F (36.8 C) (Oral)  Resp 12  SpO2 91% Physical Exam  Constitutional: He is oriented to person, place, and time. He appears well-developed and well-nourished.  Obese  HENT:  Head: Normocephalic and atraumatic.  Mouth/Throat: Oropharynx is clear and moist.  Eyes: Conjunctivae are normal. Pupils are equal, round, and reactive to light. Right eye exhibits no discharge. Left eye exhibits no discharge. No scleral icterus.   Neck: Neck supple.  Cardiovascular: Normal rate, regular rhythm and normal heart sounds.   Pulmonary/Chest: Effort normal and breath sounds normal. No respiratory distress. He has no wheezes. He has no rales.  Abdominal: Soft. There is no tenderness.  Musculoskeletal: He exhibits no tenderness.  Neurological: He is alert and oriented to person, place, and time.  Cranial Nerves II-XII grossly intact  Skin: Skin is warm and dry. No rash noted.  Psychiatric: He has a normal mood and affect.  Nursing note and vitals reviewed.   ED Course  Procedures (including critical care time) Labs Review Labs Reviewed  CBC - Abnormal; Notable for the following:    WBC 13.0 (*)    All other components within normal limits  BASIC METABOLIC PANEL - Abnormal; Notable for the following:    Glucose, Bld 106 (*)    All other components within normal limits  BRAIN NATRIURETIC PEPTIDE  TROPONIN I  D-DIMER, QUANTITATIVE    Imaging Review Dg Chest 2 View  09/03/2014   CLINICAL DATA:  Heavy chest and cough for 2 weeks. Diagnosed with bronchitis last week.  EXAM: CHEST  2 VIEW  COMPARISON:  08/26/2014  FINDINGS: Shallow inspiration. Linear atelectasis or fibrosis in the right greater than left lung base. Normal heart size and pulmonary vascularity. No focal airspace disease or consolidation in the lungs. No blunting of costophrenic angles. No pneumothorax. Mediastinal contours appear intact. Old compression deformities of lower thoracic vertebrae. No change since previous study.  IMPRESSION: Shallow inspiration with linear atelectasis or fibrosis in the lung bases. No developing consolidation.   Electronically Signed   By: Burman Nieves M.D.   On: 09/03/2014 18:27     EKG Interpretation None     Meds given in ED:  Medications  ipratropium-albuterol (DUONEB) 0.5-2.5 (3) MG/3ML nebulizer solution 3 mL (3 mLs Nebulization Given 09/03/14 1946)  ipratropium-albuterol (DUONEB) 0.5-2.5 (3) MG/3ML nebulizer  solution 3 mL (3 mLs Nebulization Given 09/03/14 2035)    New Prescriptions   ALBUTEROL (PROVENTIL HFA;VENTOLIN HFA) 108 (90 BASE) MCG/ACT INHALER    Inhale 1-2 puffs into the lungs every 6 (six) hours as needed for wheezing or shortness of breath.   PREDNISONE (DELTASONE) 20 MG TABLET    3 tabs po day one, then 2 tabs daily x 4 days   Filed Vitals:   09/03/14 2230 09/03/14 2300 09/03/14 2330 09/03/14 2334  BP: 114/87 128/79 133/84   Pulse: 103 100 95   Temp:    98.3 F (36.8 C)  TempSrc:    Oral  Resp: SpO2: 96% 95% 91%     MDM  Vitals stable -afebrile. Mild tachycardia likely secondary to albuterol breathing treatments. Pt resting comfortably in ED. Reports he feels better after administration of breathing treatments. PE: benign lung exam, no evidence of fluid overload Labwork noncontributory, nonspecific leukocytosis 13.0, d-dimer negative, BNP negative, troponin negative Imaging chest x-ray unchanged from previous.  No developing consolidations.  DDX-patient likely experiencing symptoms related to acute viral bronchitis. Will DC with bronchodilators, steroid taper. Encouraged continued use of Mucinex, decongestants. Reports he has cough lozenges at home.  I discussed all relevant lab findings and imaging results with pt and they verbalized understanding. Discussed f/u with PCP within 48 hrs and return precautions, pt very amenable to plan.  Final diagnoses:  Acute viral bronchitis        Sharlene Motts, PA-C 09/04/14 0100  Sharlene Motts, PA-C 09/04/14 0100  Mancel Bale, MD 09/04/14 [phone]

## 2014-09-27 ENCOUNTER — Encounter: Payer: Self-pay | Admitting: Internal Medicine

## 2014-09-27 ENCOUNTER — Ambulatory Visit: Payer: Self-pay | Attending: Internal Medicine | Admitting: Internal Medicine

## 2014-09-27 VITALS — BP 135/87 | HR 111 | Temp 98.1°F | Resp 15 | Wt 302.6 lb

## 2014-09-27 DIAGNOSIS — Z139 Encounter for screening, unspecified: Secondary | ICD-10-CM | POA: Insufficient documentation

## 2014-09-27 DIAGNOSIS — Z8709 Personal history of other diseases of the respiratory system: Secondary | ICD-10-CM

## 2014-09-27 DIAGNOSIS — R000 Tachycardia, unspecified: Secondary | ICD-10-CM | POA: Insufficient documentation

## 2014-09-27 DIAGNOSIS — Z833 Family history of diabetes mellitus: Secondary | ICD-10-CM | POA: Insufficient documentation

## 2014-09-27 DIAGNOSIS — R059 Cough, unspecified: Secondary | ICD-10-CM | POA: Insufficient documentation

## 2014-09-27 DIAGNOSIS — Z1211 Encounter for screening for malignant neoplasm of colon: Secondary | ICD-10-CM

## 2014-09-27 DIAGNOSIS — J40 Bronchitis, not specified as acute or chronic: Secondary | ICD-10-CM | POA: Insufficient documentation

## 2014-09-27 DIAGNOSIS — E669 Obesity, unspecified: Secondary | ICD-10-CM | POA: Insufficient documentation

## 2014-09-27 DIAGNOSIS — R05 Cough: Secondary | ICD-10-CM | POA: Insufficient documentation

## 2014-09-27 LAB — CBC WITH DIFFERENTIAL/PLATELET
BASOS ABS: 0 10*3/uL (ref 0.0–0.1)
Basophils Relative: 0 % (ref 0–1)
EOS PCT: 3 % (ref 0–5)
Eosinophils Absolute: 0.3 10*3/uL (ref 0.0–0.7)
HEMATOCRIT: 45.4 % (ref 39.0–52.0)
Hemoglobin: 14.8 g/dL (ref 13.0–17.0)
LYMPHS PCT: 19 % (ref 12–46)
Lymphs Abs: 1.9 10*3/uL (ref 0.7–4.0)
MCH: 27.3 pg (ref 26.0–34.0)
MCHC: 32.6 g/dL (ref 30.0–36.0)
MCV: 83.6 fL (ref 78.0–100.0)
MONO ABS: 0.7 10*3/uL (ref 0.1–1.0)
MPV: 9.1 fL (ref 8.6–12.4)
Monocytes Relative: 7 % (ref 3–12)
Neutro Abs: 7.2 10*3/uL (ref 1.7–7.7)
Neutrophils Relative %: 71 % (ref 43–77)
PLATELETS: 364 10*3/uL (ref 150–400)
RBC: 5.43 MIL/uL (ref 4.22–5.81)
RDW: 14.3 % (ref 11.5–15.5)
WBC: 10.1 10*3/uL (ref 4.0–10.5)

## 2014-09-27 LAB — COMPLETE METABOLIC PANEL WITH GFR
ALK PHOS: 87 U/L (ref 39–117)
ALT: 26 U/L (ref 0–53)
AST: 24 U/L (ref 0–37)
Albumin: 4 g/dL (ref 3.5–5.2)
BUN: 10 mg/dL (ref 6–23)
CALCIUM: 9.7 mg/dL (ref 8.4–10.5)
CHLORIDE: 105 meq/L (ref 96–112)
CO2: 26 mEq/L (ref 19–32)
CREATININE: 0.82 mg/dL (ref 0.50–1.35)
GFR, Est African American: >89 mL/min
Glucose, Bld: 85 mg/dL (ref 70–99)
Potassium: 5 mEq/L (ref 3.5–5.3)
Sodium: 142 mEq/L (ref 135–145)
Total Bilirubin: 0.3 mg/dL (ref 0.2–1.2)
Total Protein: 7.7 g/dL (ref 6.0–8.3)

## 2014-09-27 LAB — TSH: TSH: 0.729 u[IU]/mL (ref 0.350–4.500)

## 2014-09-27 MED ORDER — AZITHROMYCIN 250 MG PO TABS: ORAL_TABLET | ORAL | Status: DC

## 2014-09-27 MED ORDER — BENZONATATE 100 MG PO CAPS: 100.0000 mg | ORAL_CAPSULE | Freq: Three times a day (TID) | ORAL | Status: DC | PRN

## 2014-09-27 NOTE — Progress Notes (Signed)
Patient Demographics  Tyrone Fernandez, is a 49 y.o. male  HQI:[id number]:[id number]  XLK:[id number]  DOB - [birth year]  CC:  Chief Complaint  Patient presents with  . Establish Care       HPI: Tyrone CallJames Sifuentes is a 49 y.o. male here today to establish medical care.Patient recently went to the emergency room twice with dry nonproductive cough shortness of breath and had x-ray done was thought to have acute viral bronchitis and was treated with steroids, bronchodilators, decongestant and was advised to followup here. Patient still reports symptoms have not completely resolved sometimes has exertional shortness of breath denies any orthopnea PND or leg swelling does complain of cough worse at night at bedtime he feels mucus draining down the back of throat, patient has been using Flonase, patient denies any chest pain denies any shortness of breath currently patient denies any change in weight or appetite, not sure if he has family history of thyroid problems. Patient has No headache, No chest pain, No abdominal pain - No Nausea, No new weakness tingling or numbness, No Cough - SOB.  No Known Allergies Past Medical History  Diagnosis Date  . History of kidney stones   . H/O hiatal hernia   . Chronic kidney disease   . Renal stones    Current Outpatient Prescriptions on File Prior to Visit  Medication Sig Dispense Refill  . albuterol (PROVENTIL HFA;VENTOLIN HFA) 108 (90 BASE) MCG/ACT inhaler Inhale 1-2 puffs into the lungs every 6 (six) hours as needed for wheezing or shortness of breath. 1 Inhaler 0  . dextromethorphan-guaiFENesin (MUCINEX DM) 30-600 MG per 12 hr tablet Take 1 tablet by mouth 2 (two) times daily. 14 tablet 0  . fluticasone (FLONASE) 50 MCG/ACT nasal spray Place 2 sprays into both nostrils daily. 16 g 0  . ibuprofen (ADVIL,MOTRIN) 200 MG tablet Take 600 mg by mouth once.    . predniSONE (DELTASONE) 20 MG tablet 3 tabs po day one, then 2 tabs daily x 4 days 11 tablet 0  .  promethazine (PHENERGAN) 25 MG tablet Take 25 mg by mouth every 6 (six) hours as needed for nausea.    . pseudoephedrine (SUDAFED 12 HOUR) 120 MG 12 hr tablet Take 1 tablet (120 mg total) by mouth 2 (two) times daily. 14 tablet 0  . tamsulosin (FLOMAX) 0.4 MG CAPS capsule Take 0.4 mg by mouth daily.     No current facility-administered medications on file prior to visit.   Family History  Problem Relation Age of Onset  . Diabetes Mother   . Cancer Mother     breast cancer   . Stroke Maternal Aunt   . Heart disease Paternal Grandfather    History   Social History  . Marital Status: Single    Spouse Name: N/A  . Number of Children: N/A  . Years of Education: N/A   Occupational History  . Not on file.   Social History Main Topics  . Smoking status: Never Smoker   . Smokeless tobacco: Never Used  . Alcohol Use: No  . Drug Use: No  . Sexual Activity: Not on file   Other Topics Concern  . Not on file   Social History Narrative    Review of Systems: Constitutional: Negative for fever, chills, diaphoresis, activity change, appetite change and fatigue. HENT: Negative for ear pain, nosebleeds, congestion, facial swelling, rhinorrhea, neck pain, neck stiffness and ear discharge.  Eyes: Negative for pain, discharge, redness, itching and visual disturbance. Respiratory: Negative  for cough, choking, chest tightness, shortness of breath, wheezing and stridor.  Cardiovascular: Negative for chest pain, palpitations and leg swelling. Gastrointestinal: Negative for abdominal distention. Genitourinary: Negative for dysuria, urgency, frequency, hematuria, flank pain, decreased urine volume, difficulty urinating and dyspareunia.  Musculoskeletal: Negative for back pain, joint swelling, arthralgia and gait problem. Neurological: Negative for dizziness, tremors, seizures, syncope, facial asymmetry, speech difficulty, weakness, light-headedness, numbness and headaches.  Hematological: Negative  for adenopathy. Does not bruise/bleed easily. Psychiatric/Behavioral: Negative for hallucinations, behavioral problems, confusion, dysphoric mood, decreased concentration and agitation.    Objective:   Filed Vitals:   09/27/14 1114  BP: 135/87  Pulse: 111  Temp: 98.1 F (36.7 C)  Resp: 15    Physical Exam: Constitutional: Patient appears well-developed and well-nourished. No distress. HENT: Normocephalic, atraumatic, External right and left ear normal. Oropharynx is clear and moist.  Eyes: Conjunctivae and EOM are normal. PERRLA, no scleral icterus. Neck: Normal ROM. Neck supple. No JVD. No tracheal deviation. No thyromegaly. CVS: tachycardic, S1/S2 +, no murmurs, no gallops, no carotid bruit.  Pulmonary: Effort and breath sounds normal, no stridor, rhonchi, wheezes, rales.  Abdominal: Soft. BS +, no distension,abdominal wall hernia, nontender. Musculoskeletal: Normal range of motion. No edema and no tenderness.  Neuro: Alert. Normal reflexes, muscle tone coordination. No cranial nerve deficit. Skin: Skin is warm and dry. No rash noted. Not diaphoretic. No erythema. No pallor. Psychiatric: Normal mood and affect. Behavior, judgment, thought content normal.  Lab Results  Component Value Date   WBC 13.0* 09/03/2014   HGB 13.4 09/03/2014   HCT 40.5 09/03/2014   MCV 83.2 09/03/2014   PLT 366 09/03/2014   Lab Results  Component Value Date   CREATININE 0.92 09/03/2014   BUN 11 09/03/2014   NA 139 09/03/2014   K 3.7 09/03/2014   CL 104 09/03/2014   CO2 28 09/03/2014    No results found for: HGBA1C Lipid Panel  No results found for: CHOL, TRIG, HDL, CHOLHDL, VLDL, LDLCALC     Assessment and plan:   1. History of bronchitis Patient symptoms are still persistent, I prescribed Zithromax - azithromycin (ZITHROMAX Z-PAK) 250 MG tablet; Take as directed  Dispense: 6 each; Refill: 0  2. Cough  - benzonatate (TESSALON) 100 MG capsule; Take 1 capsule (100 mg total) by mouth  3 (three) times daily as needed for cough.  Dispense: 30 capsule; Refill: 1 - azithromycin (ZITHROMAX Z-PAK) 250 MG tablet; Take as directed  Dispense: 6 each; Refill: 0  3. Obesity Advised patient for diet and exercise - TSH  4. Special screening for malignant neoplasms, colon  - Ambulatory referral to Gastroenterology  5. Tachycardia We'll check - CBC with Differential/Platelet - TSH  6. Screening  - COMPLETE METABOLIC PANEL WITH GFR - Vit D  25 hydroxy (rtn osteoporosis monitoring)  7. Family history of diabetes mellitus (DM)  - Hemoglobin A1c      Health Maintenance -Colonoscopy:referred to GI   Return in about 3 months (around 12/28/2014), or if symptoms worsen or fail to improve.    The patient was given clear instructions to go to ER or return to medical center if symptoms don't improve, worsen or new problems develop. The patient verbalized understanding. The patient was told to call to get lab results if they haven't heard anything in the next week.    This note has been created with Education officer, environmental. Any transcriptional errors are unintentional.   Doris Cheadle, MD

## 2014-09-27 NOTE — Progress Notes (Signed)
Patient here to establish care Patient was recently seen in the ED and diagnose with bronchitis Complains of SOB,tired, cough and congestion Patient feels he cant walk long distances without being short winded

## 2014-09-28 LAB — HEMOGLOBIN A1C
Hgb A1c MFr Bld: 5.8 % — ABNORMAL HIGH (ref <5.7)
Mean Plasma Glucose: 120 mg/dL — ABNORMAL HIGH (ref <117)

## 2014-09-28 LAB — VITAMIN D 25 HYDROXY (VIT D DEFICIENCY, FRACTURES): Vit D, 25-Hydroxy: 20 ng/mL — ABNORMAL LOW (ref 30–100)

## 2014-10-06 ENCOUNTER — Telehealth: Payer: Self-pay

## 2014-10-06 MED ORDER — VITAMIN D (ERGOCALCIFEROL) 1.25 MG (50000 UNIT) PO CAPS: 50000.0000 [IU] | ORAL_CAPSULE | ORAL | Status: DC

## 2014-10-06 NOTE — Telephone Encounter (Signed)
-----   Message from Doris Cheadleeepak Advani, MD sent at 09/28/2014  9:29 AM EDT ----- Blood work reviewed noticed hemoglobin A1c of  5.8 %, patient has prediabetes, call and advise patient for low carbohydrate diet. , noticed low vitamin D, call patient advise to start ergocalciferol 50,000 units once a week for the duration of  12 weeks.

## 2014-10-06 NOTE — Telephone Encounter (Signed)
Patient not available Left message on voice mail to return our call Prescription sent to his pharmacy

## 2016-03-30 DIAGNOSIS — K56609 Unspecified intestinal obstruction, unspecified as to partial versus complete obstruction: Secondary | ICD-10-CM

## 2016-03-30 HISTORY — DX: Unspecified intestinal obstruction, unspecified as to partial versus complete obstruction: K56.609

## 2016-04-08 ENCOUNTER — Inpatient Hospital Stay (HOSPITAL_COMMUNITY)
Admission: EM | Admit: 2016-04-08 | Discharge: 2016-04-11 | DRG: 394 | Disposition: A | Discharge status: Home / Self Care | Payer: Self-pay | Attending: Internal Medicine | Admitting: Internal Medicine

## 2016-04-08 ENCOUNTER — Encounter (HOSPITAL_COMMUNITY): Payer: Self-pay

## 2016-04-08 ENCOUNTER — Emergency Department (HOSPITAL_COMMUNITY): Payer: Self-pay

## 2016-04-08 DIAGNOSIS — K439 Ventral hernia without obstruction or gangrene: Secondary | ICD-10-CM

## 2016-04-08 DIAGNOSIS — K430 Incisional hernia with obstruction, without gangrene: Principal | ICD-10-CM | POA: Diagnosis present

## 2016-04-08 DIAGNOSIS — K436 Other and unspecified ventral hernia with obstruction, without gangrene: Secondary | ICD-10-CM

## 2016-04-08 DIAGNOSIS — Z833 Family history of diabetes mellitus: Secondary | ICD-10-CM

## 2016-04-08 DIAGNOSIS — Z823 Family history of stroke: Secondary | ICD-10-CM

## 2016-04-08 DIAGNOSIS — Z6841 Body Mass Index (BMI) 40.0 and over, adult: Secondary | ICD-10-CM

## 2016-04-08 DIAGNOSIS — K56609 Unspecified intestinal obstruction, unspecified as to partial versus complete obstruction: Secondary | ICD-10-CM | POA: Diagnosis present

## 2016-04-08 DIAGNOSIS — Z7951 Long term (current) use of inhaled steroids: Secondary | ICD-10-CM

## 2016-04-08 DIAGNOSIS — Z8249 Family history of ischemic heart disease and other diseases of the circulatory system: Secondary | ICD-10-CM

## 2016-04-08 DIAGNOSIS — E669 Obesity, unspecified: Secondary | ICD-10-CM | POA: Diagnosis present

## 2016-04-08 HISTORY — DX: Unspecified intestinal obstruction, unspecified as to partial versus complete obstruction: K56.609

## 2016-04-08 LAB — COMPREHENSIVE METABOLIC PANEL
ALBUMIN: 3.8 g/dL (ref 3.5–5.0)
ALT: 31 U/L (ref 17–63)
AST: 28 U/L (ref 15–41)
Alkaline Phosphatase: 81 U/L (ref 38–126)
Anion gap: 8 (ref 5–15)
BILIRUBIN TOTAL: 0.3 mg/dL (ref 0.3–1.2)
BUN: 11 mg/dL (ref 6–20)
CHLORIDE: 103 mmol/L (ref 101–111)
CO2: 27 mmol/L (ref 22–32)
CREATININE: 0.9 mg/dL (ref 0.61–1.24)
Calcium: 9.1 mg/dL (ref 8.9–10.3)
GFR calc Af Amer: >60 mL/min (ref >60)
GLUCOSE: 129 mg/dL — AB (ref 65–99)
Potassium: 4 mmol/L (ref 3.5–5.1)
Sodium: 138 mmol/L (ref 135–145)
TOTAL PROTEIN: 7.8 g/dL (ref 6.5–8.1)

## 2016-04-08 LAB — LIPASE, BLOOD: Lipase: 20 U/L (ref 11–51)

## 2016-04-08 LAB — CBC
HCT: 48.7 % (ref 39.0–52.0)
Hemoglobin: 15.9 g/dL (ref 13.0–17.0)
MCH: 27.7 pg (ref 26.0–34.0)
MCHC: 32.6 g/dL (ref 30.0–36.0)
MCV: 84.8 fL (ref 78.0–100.0)
PLATELETS: 296 10*3/uL (ref 150–400)
RBC: 5.74 MIL/uL (ref 4.22–5.81)
RDW: 13.6 % (ref 11.5–15.5)
WBC: 10.3 10*3/uL (ref 4.0–10.5)

## 2016-04-08 LAB — URINALYSIS, ROUTINE W REFLEX MICROSCOPIC
BILIRUBIN URINE: NEGATIVE
GLUCOSE, UA: NEGATIVE mg/dL
Hgb urine dipstick: NEGATIVE
KETONES UR: NEGATIVE mg/dL
LEUKOCYTES UA: NEGATIVE
Nitrite: NEGATIVE
PH: 6 (ref 5.0–8.0)
PROTEIN: NEGATIVE mg/dL
Specific Gravity, Urine: 1.026 (ref 1.005–1.030)

## 2016-04-08 MED ORDER — LIDOCAINE VISCOUS 2 % MT SOLN
15.0000 mL | Freq: Once | OROMUCOSAL | Status: DC
  Filled 2016-04-08: qty 15

## 2016-04-08 MED ORDER — SODIUM CHLORIDE 0.9 % IV BOLUS (SEPSIS)
1000.0000 mL | Freq: Once | INTRAVENOUS | Status: AC
  Administered 2016-04-08: 1000 mL via INTRAVENOUS

## 2016-04-08 MED ORDER — ONDANSETRON HCL 4 MG PO TABS: 4.0000 mg | ORAL_TABLET | ORAL | Status: DC | PRN

## 2016-04-08 MED ORDER — HEPARIN SODIUM (PORCINE) 5000 UNIT/ML IJ SOLN
5000.0000 [IU] | Freq: Three times a day (TID) | INTRAMUSCULAR | Status: DC
  Administered 2016-04-08 – 2016-04-11 (×9): 5000 [IU] via SUBCUTANEOUS
  Filled 2016-04-08 (×8): qty 1

## 2016-04-08 MED ORDER — IOPAMIDOL (ISOVUE-300) INJECTION 61%
INTRAVENOUS | Status: AC
  Administered 2016-04-08: 100 mL
  Filled 2016-04-08: qty 100

## 2016-04-08 MED ORDER — ONDANSETRON HCL 4 MG/2ML IJ SOLN
4.0000 mg | INTRAMUSCULAR | Status: DC | PRN
  Administered 2016-04-08: 4 mg via INTRAVENOUS
  Filled 2016-04-08: qty 2

## 2016-04-08 MED ORDER — HYDROMORPHONE HCL 1 MG/ML IJ SOLN
1.0000 mg | INTRAMUSCULAR | Status: DC | PRN
  Administered 2016-04-08 – 2016-04-10 (×4): 1 mg via INTRAVENOUS
  Filled 2016-04-08 (×5): qty 1

## 2016-04-08 MED ORDER — HYDROMORPHONE HCL 1 MG/ML IJ SOLN
1.0000 mg | Freq: Once | INTRAMUSCULAR | Status: AC
  Administered 2016-04-08: 1 mg via INTRAVENOUS

## 2016-04-08 MED ORDER — PROMETHAZINE HCL 25 MG/ML IJ SOLN
12.5000 mg | Freq: Once | INTRAMUSCULAR | Status: AC
  Administered 2016-04-08: 12.5 mg via INTRAVENOUS
  Filled 2016-04-08: qty 1

## 2016-04-08 MED ORDER — SODIUM CHLORIDE 0.9 % IV SOLN: INTRAVENOUS | Status: DC

## 2016-04-08 MED ORDER — SODIUM CHLORIDE 0.9 % IV SOLN
INTRAVENOUS | Status: DC
  Administered 2016-04-08 – 2016-04-09 (×2): via INTRAVENOUS

## 2016-04-08 MED ORDER — ACETAMINOPHEN 325 MG PO TABS: 650.0000 mg | ORAL_TABLET | Freq: Four times a day (QID) | ORAL | Status: DC | PRN

## 2016-04-08 MED ORDER — ONDANSETRON HCL 4 MG/2ML IJ SOLN
4.0000 mg | Freq: Once | INTRAMUSCULAR | Status: AC | PRN
  Administered 2016-04-08: 4 mg via INTRAVENOUS
  Filled 2016-04-08: qty 2

## 2016-04-08 MED ORDER — ONDANSETRON HCL 4 MG PO TABS: 4.0000 mg | ORAL_TABLET | Freq: Four times a day (QID) | ORAL | Status: DC | PRN

## 2016-04-08 MED ORDER — METOCLOPRAMIDE HCL 5 MG/ML IJ SOLN
10.0000 mg | Freq: Once | INTRAMUSCULAR | Status: AC
  Administered 2016-04-08: 10 mg via INTRAVENOUS
  Filled 2016-04-08: qty 2

## 2016-04-08 MED ORDER — HYDROMORPHONE HCL 1 MG/ML IJ SOLN
1.0000 mg | Freq: Once | INTRAMUSCULAR | Status: AC
  Administered 2016-04-08: 1 mg via INTRAVENOUS
  Filled 2016-04-08: qty 1

## 2016-04-08 MED ORDER — ACETAMINOPHEN 650 MG RE SUPP: 650.0000 mg | Freq: Four times a day (QID) | RECTAL | Status: DC | PRN

## 2016-04-08 MED ORDER — ONDANSETRON HCL 4 MG/2ML IJ SOLN
4.0000 mg | Freq: Four times a day (QID) | INTRAMUSCULAR | Status: DC | PRN
  Administered 2016-04-08: 4 mg via INTRAVENOUS
  Filled 2016-04-08 (×2): qty 2

## 2016-04-08 MED ORDER — PHENOL 1.4 % MT LIQD
1.0000 | OROMUCOSAL | Status: DC | PRN
  Filled 2016-04-08: qty 177

## 2016-04-08 MED ORDER — SODIUM CHLORIDE 0.9 % IV SOLN
Freq: Once | INTRAVENOUS | Status: AC
  Administered 2016-04-08: 09:00:00 via INTRAVENOUS

## 2016-04-08 MED ORDER — MORPHINE SULFATE (PF) 4 MG/ML IV SOLN
4.0000 mg | Freq: Once | INTRAVENOUS | Status: AC
  Administered 2016-04-08: 4 mg via INTRAVENOUS
  Filled 2016-04-08: qty 1

## 2016-04-08 NOTE — ED Triage Notes (Signed)
Pt c/o of lower abdominal pain for the last day. Pt states he may be constipated. Pt has history of several abdominal surgery's.

## 2016-04-08 NOTE — Progress Notes (Signed)
[facility] Surgery Consult Note  Tyrone Fernandez 1966/05/24  [id number].    Requesting MD: Dr. Kathrynn Humble Chief Complaint/Reason for Consult: SBO HPI:  50 y/o obese male with a PMH of diverticulitis and multiple abdominal surgeries who presented to [facility] with acute abdominal pain that woke him up from sleep this morning. Pain is described as sharp, non-radiating, and located in his lower abdomen. Pain is improved with IV pain medication but is recurrent. Associated symptoms include 3 episodes of vomiting, most recent being this morning around 0900. Last episode of flatus was yesterday at 9:00 pm. Last BM was last night, was small and formed.  Patient has a history of multiple abdominal surgeries by Dr. Rise Patience including partial colectomy and colostomy s/p reversal (2003), appendectomy (2003), and cholecystectomy, LOA, primary ventral hernia repair (2010).  Denies fever, chills, CP, SOB, hematemesis, hematochezia, melena, and urinary symptoms.   CT Abd/Pelvis: two large anterior abdominal wall hernias, the more inferior hernia concerning for SBO with proximal small bowel dilatation. CBC, CMET, Lipase, and UA are WNL.  ROS: All systems reviewed and otherwise negative except for as above  Family History  Problem Relation Age of Onset  . Diabetes Mother   . Cancer Mother     breast cancer   . Stroke Maternal Aunt   . Heart disease Paternal Grandfather     Past Medical History:  Diagnosis Date  . Chronic kidney disease   . H/O hiatal hernia   . History of kidney stones   . Renal stones     Past Surgical History:  Procedure Laterality Date  . [facility]    . 2007 OR 2008  KIDNEY STONE REMOVED    . APPENDECTOMY  2003   DONE AT TIME OF COLON RESECTION  . CHOLECYSTECTOMY  2010   AT SAME TIME OF HERNIA REPAIR  . COLON SURGERY  2003   COLON RESECTION FOR DIVERTICULITIS / COLOSTOMY  . COLOSTOMY REVERSAL  2003  . CYSTOSCOPY WITH URETEROSCOPY AND STENT PLACEMENT Right  05/03/2013   Procedure: CYSTOSCOPY, RIGHT RETROGRADE PYELOGRAMRIGHT URETEROSCOPY, STONE EXTRACTION  AND STENT PLACEMENT;  Surgeon: Fredricka Bonine, MD;  Location: [facility];  Service: Urology;  Laterality: Right;  . HERNIA REPAIR  2010    Social History:  reports that he has never smoked. He has never used smokeless tobacco. He reports that he does not drink alcohol or use drugs.  Allergies: No Known Allergies  Medications Prior to Admission  Medication Sig Dispense Refill  . albuterol (PROVENTIL HFA;VENTOLIN HFA) 108 (90 BASE) MCG/ACT inhaler Inhale 1-2 puffs into the lungs every 6 (six) hours as needed for wheezing or shortness of breath. (Patient not taking: Reported on 04/08/2016) 1 Inhaler 0  . azithromycin (ZITHROMAX Z-PAK) 250 MG tablet Take as directed (Patient not taking: Reported on 04/08/2016) 6 each 0  . benzonatate (TESSALON) 100 MG capsule Take 1 capsule (100 mg total) by mouth 3 (three) times daily as needed for cough. (Patient not taking: Reported on 04/08/2016) 30 capsule 1  . dextromethorphan-guaiFENesin (MUCINEX DM) 30-600 MG per 12 hr tablet Take 1 tablet by mouth 2 (two) times daily. (Patient not taking: Reported on 04/08/2016) 14 tablet 0  . fluticasone (FLONASE) 50 MCG/ACT nasal spray Place 2 sprays into both nostrils daily. (Patient not taking: Reported on 04/08/2016) 16 g 0  . predniSONE (DELTASONE) 20 MG tablet 3 tabs po day one, then 2 tabs daily x 4 days (Patient not taking: Reported on 04/08/2016) 11 tablet 0  .  pseudoephedrine (SUDAFED 12 HOUR) 120 MG 12 hr tablet Take 1 tablet (120 mg total) by mouth 2 (two) times daily. (Patient not taking: Reported on 04/08/2016) 14 tablet 0  . Vitamin D, Ergocalciferol, (DRISDOL) 50000 UNITS CAPS capsule Take 1 capsule (50,000 Units total) by mouth every 7 (seven) days. (Patient not taking: Reported on 04/08/2016) 12 capsule 0    Blood pressure (!) 149/97, pulse 96, temperature 98.5 F (36.9 C), temperature source Oral,  resp. rate 16, height 5' 10"  (1.778 m), weight 287 lb (130.2 kg), SpO2 96 %. Physical Exam: General: pleasant, obese white male who is laying in bed in NAD HEENT: head is normocephalic, atraumatic.  Mouth is pink and moist Heart: regular, rate, and rhythm.  No obvious murmurs, gallops, or rubs noted.  Palpable pedal pulses bilaterally Lungs: CTAB, no wheezes, rhonchi, or rales noted.  Respiratory effort nonlabored Abd: soft, TTP of lower abdomen without rebound, guarding, or peritonitis. +BS. 2 anterior abdominal wall hernias that are soft, reducible, no overlying skin changes. MS: all 4 extremities are symmetrical with no cyanosis, clubbing, or edema. Skin: warm and dry with no masses, lesions, or rashes Psych:  appropriate affect. Neuro: A&Ox3, moves all 4 extremities equally and spontaneously, normal speech  Results for orders placed or performed during the hospital encounter of 04/08/16 (from the past 48 hour(s))  Lipase, blood     Status: None   Collection Time: 04/08/16  6:33 AM  Result Value Ref Range   Lipase 20 11 - 51 U/L  Comprehensive metabolic panel     Status: Abnormal   Collection Time: 04/08/16  6:33 AM  Result Value Ref Range   Sodium 138 135 - 145 mmol/L   Potassium 4.0 3.5 - 5.1 mmol/L   Chloride 103 101 - 111 mmol/L   CO2 27 22 - 32 mmol/L   Glucose, Bld 129 (H) 65 - 99 mg/dL   BUN 11 6 - 20 mg/dL   Creatinine, Ser 0.90 0.61 - 1.24 mg/dL   Calcium 9.1 8.9 - 10.3 mg/dL   Total Protein 7.8 6.5 - 8.1 g/dL   Albumin 3.8 3.5 - 5.0 g/dL   AST 28 15 - 41 U/L   ALT 31 17 - 63 U/L   Alkaline Phosphatase 81 38 - 126 U/L   Total Bilirubin 0.3 0.3 - 1.2 mg/dL   GFR calc non Af Amer >60 >60 mL/min   GFR calc Af Amer >60 >60 mL/min    Comment: (NOTE) The eGFR has been calculated using the CKD EPI equation. This calculation has not been validated in all clinical situations. eGFR's persistently <60 mL/min signify possible Chronic Kidney Disease.    Anion gap 8 5 - 15   CBC     Status: None   Collection Time: 04/08/16  6:33 AM  Result Value Ref Range   WBC 10.3 4.0 - 10.5 K/uL   RBC 5.74 4.22 - 5.81 MIL/uL   Hemoglobin 15.9 13.0 - 17.0 g/dL   HCT 48.7 39.0 - 52.0 %   MCV 84.8 78.0 - 100.0 fL   MCH 27.7 26.0 - 34.0 pg   MCHC 32.6 30.0 - 36.0 g/dL   RDW 13.6 11.5 - 15.5 %   Platelets 296 150 - 400 K/uL  Urinalysis, Routine w reflex microscopic     Status: None   Collection Time: 04/08/16  6:49 AM  Result Value Ref Range   Color, Urine YELLOW YELLOW   APPearance CLEAR CLEAR   Specific Gravity, Urine 1.026 1.005 -  1.030   pH 6.0 5.0 - 8.0   Glucose, UA NEGATIVE NEGATIVE mg/dL   Hgb urine dipstick NEGATIVE NEGATIVE   Bilirubin Urine NEGATIVE NEGATIVE   Ketones, ur NEGATIVE NEGATIVE mg/dL   Protein, ur NEGATIVE NEGATIVE mg/dL   Nitrite NEGATIVE NEGATIVE   Leukocytes, UA NEGATIVE NEGATIVE    Comment: MICROSCOPIC NOT DONE ON URINES WITH NEGATIVE PROTEIN, BLOOD, LEUKOCYTES, NITRITE, OR GLUCOSE <1000 mg/dL.   Ct Abdomen Pelvis W Contrast  Result Date: 04/08/2016 CLINICAL DATA:  Abdominal pain for 2-3 days EXAM: CT ABDOMEN AND PELVIS WITH CONTRAST TECHNIQUE: Multidetector CT imaging of the abdomen and pelvis was performed using the standard protocol following bolus administration of intravenous contrast. CONTRAST:  170m ISOVUE-300 IOPAMIDOL (ISOVUE-300) INJECTION 61% COMPARISON:  02/06/2013 FINDINGS: Lower chest: No acute abnormality. Hepatobiliary: Diffuse fatty infiltration of the liver is noted. Gallbladder has been surgically removed. Pancreas: Unremarkable. No pancreatic ductal dilatation or surrounding inflammatory changes. Spleen: Normal in size without focal abnormality. Adrenals/Urinary Tract: Nonobstructing renal stone is noted in the lower pole of the left kidney measuring approximately 9 mm in greatest dimension. A few smaller 1-2 mm calcifications are also seen. Bilateral renal cystic change is noted. Stomach/Bowel: The appendix has been  surgically removed. A large anterior abdominal wall defect is seen. There are 2 separate defects identified each with have a bilobed appearance. The more superior contains a loop of transverse colon without obstructive change. The more inferior demonstrates some small bowel within with mild proximal small bowel dilatation consistent with a partial small bowel obstruction. This dilatation involves primarily the jejunal loops in the left abdomen. No definitive caliber changes noted as the small bowel loops and turned hernia to the right of the midline best seen on image number 65 of series 201. Vascular/Lymphatic: No significant vascular findings are present. No enlarged abdominal or pelvic lymph nodes. Reproductive: Prostate is unremarkable. Other: 2 large anterior abdominal hernias as described above. Musculoskeletal: Stable scoliosis is noted of the thoracolumbar spine. 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 IMPRESSION: Two large anterior abdominal wall hernias both with a somewhat bilobed appearance. The more inferior hernia shows evidence of a partial small bowel obstruction with definitive caliber change in the distal jejunum/ proximal ileum. Left renal calculi without obstructive changes. Chronic changes as described above. Electronically Signed   By: MInez CatalinaM.D.   On: 04/08/2016 08:22   Assessment/Plan Partial small bowel obstruction Nausea/vomiting  S/p multiple abdominal surgeries - NPO after MN - pain control and PRN antiemetics - monitor bowel function - no flatus or BM today - NGT decompression for persistent vomiting - will discuss surgical plan with MD   EJill Alexanders PJefferson [facility] LLCS[facility]gery 04/08/2016, 11:40 AM Pager: [phone]: [phone]-Fri 7:00 am-4:30 pm Sat-Sun 7:00 am-11:30 am

## 2016-04-08 NOTE — H&P (Signed)
Date: 04/08/2016               Patient Name:  Tyrone Fernandez MRN: [id number]  DOB: 1965-09-18 Age / Sex: 50 y.o., male   PCP: Doris Cheadleeepak Advani, MD         Medical Service: Internal Medicine Teaching Service         Attending Physician: Dr. Criselda PeachesMullen    First Contact: Dr. Nelson ChimesAmin Pager: [phone]  Second Contact: Dr. Earlene PlaterWallace Pager: [phone]       After Hours (After 5p/  First Contact Pager: [phone]  weekends / holidays): Second Contact Pager: [phone]   Chief Complaint: abdominal pain  History of Present Illness: 50 year old male with past medical history of cholecystectomy,  SBO, appendectomy, diverticulitis requiring colonic resection with colostomy reversal, and abdominal hernias who presents with sudden onset of abdominal pain that woke him up from sleep this morning at 3 AM. Abdominal pain is located at bilateral lower quadrants without radiation, pain is 8/10 in severity and constant. He had about 3 episodes of vomiting and dry heaving. Denies diarrhea, his last bowel movement was yesterday. He is having flatus. Denies blood in his bowel movement and emesis. He reports he was supposed to have mesh placed for his abdominal hernias but it was not done due to his gallbladder issues at that time. His last abdominal surgery was in 2010  CT with contrast of abdomen and pelvis revealed 2 large anterior abdominal wall hernias with the more inferior hernia concerning for a partial small bowel obstruction. General surgery was consult that and recommended admission.  Meds:  Patient does not take any outpatient medications.   Allergies: Allergies as of 04/08/2016  . (No Known Allergies)   Past Medical History:  Diagnosis Date  . Chronic kidney disease   . H/O hiatal hernia   . History of kidney stones   . Renal stones     Family History: Mother has Crohn's disease  Social History: Denies tobacco, alcohol, and illicit drug use. He lives alone with his cat. 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  Review of Systems: A complete ROS was negative except as per HPI. Denies fevers, night sweats, chills, weight loss, dizziness, chest pain, diarrhea, morning fatigue, snoring, and morning headaches. Positive for constipation requiring laxatives, nausea, vomiting, and abdominal pain.   Physical Exam: Blood pressure 138/88, pulse 78, temperature 97.9 F (36.6 C), temperature source Oral, resp. rate 13, height 5\' 10"  (1.778 m), weight 287 lb (130.2 kg), SpO2 96 %. Physical Exam  Constitutional: He appears well-developed and well-nourished. No distress.  HENT:  Head: Normocephalic and atraumatic.  Nose: Nose normal.  Cardiovascular: Normal rate, regular rhythm and normal heart sounds.  Exam reveals no gallop and no friction rub.   No murmur heard. Pulmonary/Chest: Effort normal and breath sounds normal. No respiratory distress. He has no wheezes. He has no rales. He exhibits no tenderness.  Abdominal: Soft. Bowel sounds are normal. He exhibits mass. He exhibits no distension. There is tenderness (Mild tenderness to palpation of right lower quadrant above protruding hernia). There is no rebound and no guarding.  Musculoskeletal: He exhibits no edema.  Skin: Skin is warm and dry. No rash noted. He is not diaphoretic. No erythema. No pallor.     CT abd/pelvis: 2 large abdominal wall hernia with inferior hernia showing evidence of partial small bowel obstruction.  Assessment & Plan by Problem: Principal Problem:   SBO (small bowel obstruction) Active Problems:  Obesity  Partial small bowel obstruction-- CT abdomen and pelvis concerning for a partial small bowel obstruction of inferior anterior wall abdominal hernia. He is having flatus, physical exam negative for an acute abdomen, and labs unremarkable.  - general surgery consulted, appreciate their recommendations - clear liquid diet - IV dilaudid 1mg  q4h prn for pain - IV zofran q6h prn for nausea  DVT ppx- [place] hep     Dispo: Admit patient to Observation with expected length of stay less than 2 midnights.  Signed: Denton Brick, MD 04/08/2016, 9:04 AM  Pager: [phone]

## 2016-04-08 NOTE — ED Notes (Signed)
[place] 2L place on pt

## 2016-04-08 NOTE — ED Notes (Signed)
Report was given to upcoming nurse [phone].

## 2016-04-08 NOTE — ED Notes (Signed)
Pt requesting more pain medicine 

## 2016-04-08 NOTE — ED Notes (Signed)
Attempted to call report

## 2016-04-08 NOTE — ED Provider Notes (Signed)
[facility]-EMERGENCY DEPT Provider Note   CSN: [id number] Arrival date & time: 04/08/16  [id number]   History   Chief Complaint Chief Complaint  Patient presents with  . Abdominal Pain  . Emesis   HPI   Tyrone Fernandez is an 50 y.o. male with history of multiple abdominal hernias, s/p cholecystectomy, appendectomy, colonic resection with colostomy reversal who presents to the ED for evaluation of abdominal pain, nausea, and vomiting. He states he was woken from sleep around 3:30 AM this morning by pain across his mid abdomen. States his pain is 8/10, constant. He reports nausea with at least three episodes of NBNB emesis today. Denies diarrhea. States his BM are typically irregular but last BM yesterday. Denies BRBPR or black tarry stools. He feels like maybe his abdomen is distended compared to baseline. Denies urinary symptoms. Denies fever, chills. Has not tried anything for his symptoms. No longer follows with GI or general surgery. Denies EtOH or illicit drug use.  Past Medical History:  Diagnosis Date  . Chronic kidney disease   . H/O hiatal hernia   . History of kidney stones   . Renal stones     Patient Active Problem List   Diagnosis Date Noted  . History of bronchitis 09/27/2014  . Cough 09/27/2014  . Obesity 09/27/2014    Past Surgical History:  Procedure Laterality Date  . 1984  KNEE SURGERY    . 2007 OR 2008  KIDNEY STONE REMOVED    . APPENDECTOMY  2003   DONE AT TIME OF COLON RESECTION  . CHOLECYSTECTOMY  2010   AT SAME TIME OF HERNIA REPAIR  . COLON SURGERY  2003   COLON RESECTION FOR DIVERTICULITIS / COLOSTOMY  . COLOSTOMY REVERSAL  2003  . CYSTOSCOPY WITH URETEROSCOPY AND STENT PLACEMENT Right 05/03/2013   Procedure: CYSTOSCOPY, RIGHT RETROGRADE PYELOGRAMRIGHT URETEROSCOPY, STONE EXTRACTION  AND STENT PLACEMENT;  Surgeon: Antony HasteMatthew Ramsey Eskridge, MD;  Location: [facility];  Service: Urology;  Laterality: Right;  . HERNIA REPAIR  2010       Home Medications     Prior to Admission medications   Medication Sig Start Date End Date Taking? Authorizing Provider  albuterol (PROVENTIL HFA;VENTOLIN HFA) 108 (90 BASE) MCG/ACT inhaler Inhale 1-2 puffs into the lungs every 6 (six) hours as needed for wheezing or shortness of breath. 09/03/14   Joycie PeekBenjamin Cartner, PA-C  azithromycin (ZITHROMAX Z-PAK) 250 MG tablet Take as directed 09/27/14   Doris Cheadleeepak Advani, MD  benzonatate (TESSALON) 100 MG capsule Take 1 capsule (100 mg total) by mouth 3 (three) times daily as needed for cough. 09/27/14   Doris Cheadleeepak Advani, MD  dextromethorphan-guaiFENesin (MUCINEX DM) 30-600 MG per 12 hr tablet Take 1 tablet by mouth 2 (two) times daily. 08/26/14   Everlene FarrierWilliam Dansie, PA-C  fluticasone (FLONASE) 50 MCG/ACT nasal spray Place 2 sprays into both nostrils daily. 08/26/14   Everlene FarrierWilliam Dansie, PA-C  ibuprofen (ADVIL,MOTRIN) 200 MG tablet Take 600 mg by mouth once.    Historical Provider, MD  predniSONE (DELTASONE) 20 MG tablet 3 tabs po day one, then 2 tabs daily x 4 days 09/03/14   Joycie PeekBenjamin Cartner, PA-C  promethazine (PHENERGAN) 25 MG tablet Take 25 mg by mouth every 6 (six) hours as needed for nausea.    Historical Provider, MD  pseudoephedrine (SUDAFED 12 HOUR) 120 MG 12 hr tablet Take 1 tablet (120 mg total) by mouth 2 (two) times daily. 08/26/14   Everlene FarrierWilliam Dansie, PA-C  tamsulosin (FLOMAX) 0.4 MG CAPS capsule Take 0.4 mg  [name] Hazardortla [phone] OkaCarmelina DaneAdministrator, Civil Service [address] [name] Molli HazardAmie [phone] OkaCarmelina DaneAdministrator, Civil Service  have a bilobed appearance. The more superior contains a loop of transverse colon without obstructive change. The more inferior demonstrates some small bowel within with mild proximal small bowel dilatation consistent with a partial small bowel obstruction. This dilatation involves primarily the jejunal loops in the left abdomen. No definitive caliber changes noted as the small bowel loops and turned hernia to the right of the midline best seen on image number 65 of series 201. Vascular/Lymphatic: No significant vascular findings are present. No enlarged abdominal or pelvic lymph nodes. Reproductive: Prostate is unremarkable. Other: 2 large anterior abdominal hernias as described above. Musculoskeletal: Stable scoliosis is noted of the thoracolumbar spine. 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 IMPRESSION: Two large anterior abdominal wall hernias both with a somewhat bilobed appearance. The more  inferior hernia shows evidence of a partial small bowel obstruction with definitive caliber change in the distal jejunum/ proximal ileum. Left renal calculi without obstructive changes. Chronic changes as described above. Electronically Signed   By: Alcide Clever M.D.   On: 04/08/2016 08:22     Procedures Procedures (including critical care time)  Medications Ordered in ED Medications  ondansetron (ZOFRAN) injection 4 mg (4 mg Intravenous Given 04/08/16 0646)  morphine 4 MG/ML injection 4 mg (4 mg Intravenous Given 04/08/16 0647)  sodium chloride 0.9 % bolus 1,000 mL (0 mLs Intravenous Stopped 04/08/16 0855)  iopamidol (ISOVUE-300) 61 % injection (100 mLs  Contrast Given 04/08/16 0748)  HYDROmorphone (DILAUDID) injection 1 mg (1 mg Intravenous Given 04/08/16 0901)  metoCLOPramide (REGLAN) injection 10 mg (10 mg Intravenous Given 04/08/16 0858)  0.9 %  sodium chloride infusion ( Intravenous New Bag/Given 04/08/16 0914)     Initial Impression / Assessment and Plan / ED Course  I have reviewed the triage vital signs and the nursing notes.  Pertinent labs & imaging results that were available during my care of the patient were reviewed by me and considered in my medical decision making (see chart for details).  Clinical Course    8:53 AM CT reveals two large anterior abdominal wall hernias, both with bilobed appearance and bowel within. Inferior hernia with evidence of a partial SBO. I spoke with general surgery PA who will see pt. Will call medicine for admission.  I spoke with Dr. Danella Penton of the [facility] who will admit to med-surg.   Final Clinical Impressions(s) / ED Diagnoses   Final diagnoses:  SBO (small bowel obstruction)  Hernia of anterior abdominal wall    New Prescriptions New Prescriptions   No medications on file     Carlene Coria, PA-C 04/08/16 0931    Derwood Kaplan, MD 04/10/16 [phone]

## 2016-04-09 ENCOUNTER — Observation Stay (HOSPITAL_COMMUNITY): Payer: Self-pay

## 2016-04-09 DIAGNOSIS — K439 Ventral hernia without obstruction or gangrene: Secondary | ICD-10-CM

## 2016-04-09 DIAGNOSIS — Z978 Presence of other specified devices: Secondary | ICD-10-CM

## 2016-04-09 MED ORDER — DEXTROSE-NACL 5-0.45 % IV SOLN
INTRAVENOUS | Status: DC
  Administered 2016-04-09 – 2016-04-10 (×2): via INTRAVENOUS

## 2016-04-09 NOTE — Progress Notes (Signed)
[facility] Progress Note     Subjective: Laying in bed, NGT clamped. + BM this morning that was formed and brown. Denies nausea. Having intermittent generalized abdominal pain.  NGT: 850 cc/24h dark output  Objective: Vital signs in last 24 hours: Temp:  [98 F (36.7 C)-98.2 F (36.8 C)] 98.2 F (36.8 C) (10/11 0500) Pulse Rate:  [83-88] 88 (10/11 0500) Resp:  [16-18] 18 (10/11 0500) BP: (127-133)/(69-95) 133/69 (10/11 0500) SpO2:  [93 %-95 %] 95 % (10/11 0500) Last BM Date: 04/09/16  Intake/Output from previous day: 10/10 0701 - 10/11 0700 In: 1110.8 [I.V.:1110.8] Out: 1550 [Urine:700; Emesis/NG output:850] Intake/Output this shift: No intake/output data recorded.  PE: Gen:  Alert, NAD, pleasant Abd: Soft, mildly TTP LLQ, ND, +BS, no HSM,  Hernias soft with no overlying skin changes Ext:  No erythema, edema, or tenderness   Lab Results:   Recent Labs  04/08/16 0633  WBC 10.3  HGB 15.9  HCT 48.7  PLT 296   BMET  Recent Labs  04/08/16 0633  NA 138  K 4.0  CL 103  CO2 27  GLUCOSE 129*  BUN 11  CREATININE 0.90  CALCIUM 9.1   PT/INR No results for input(s): LABPROT, INR in the last 72 hours. CMP     Component Value Date/Time   NA 138 04/08/2016 0633   K 4.0 04/08/2016 0633   CL 103 04/08/2016 0633   CO2 27 04/08/2016 0633   GLUCOSE 129 (H) 04/08/2016 0633   BUN 11 04/08/2016 0633   CREATININE 0.90 04/08/2016 0633   CREATININE 0.82 09/27/2014 1206   CALCIUM 9.1 04/08/2016 0633   PROT 7.8 04/08/2016 0633   ALBUMIN 3.8 04/08/2016 0633   AST 28 04/08/2016 0633   ALT 31 04/08/2016 0633   ALKPHOS 81 04/08/2016 0633   BILITOT 0.3 04/08/2016 0633   GFRNONAA >60 04/08/2016 0633   GFRNONAA >89 09/27/2014 1206   GFRAA >60 04/08/2016 0633   GFRAA >89 09/27/2014 1206   Lipase     Component Value Date/Time   LIPASE 20 04/08/2016 0981       Studies/Results: Ct Abdomen Pelvis W Contrast  Result Date: 04/08/2016 CLINICAL DATA:   Abdominal pain for 2-3 days EXAM: CT ABDOMEN AND PELVIS WITH CONTRAST TECHNIQUE: Multidetector CT imaging of the abdomen and pelvis was performed using the standard protocol following bolus administration of intravenous contrast. CONTRAST:  ISOVUE-300 IOPAMIDOL (ISOVUE-300) INJECTION 61% COMPARISON:  02/06/2013 FINDINGS: Lower chest: No acute abnormality. Hepatobiliary: Diffuse fatty infiltration of the liver is noted. Gallbladder has been surgically removed. Pancreas: Unremarkable. No pancreatic ductal dilatation or surrounding inflammatory changes. Spleen: Normal in size without focal abnormality. Adrenals/Urinary Tract: Nonobstructing renal stone is noted in the lower pole of the left kidney measuring approximately 9 mm in greatest dimension. A few smaller 1-2 mm calcifications are also seen. Bilateral renal cystic change is noted. Stomach/Bowel: The appendix has been surgically removed. A large anterior abdominal wall defect is seen. There are 2 separate defects identified each with have a bilobed appearance. The more superior contains a loop of transverse colon without obstructive change. The more inferior demonstrates some small bowel within with mild proximal small bowel dilatation consistent with a partial small bowel obstruction. This dilatation involves primarily the jejunal loops in the left abdomen. No definitive caliber changes noted as the small bowel loops and turned hernia to the right of the midline best seen on image number 65 of series 201. Vascular/Lymphatic: No significant vascular findings are present.  No enlarged abdominal or pelvic lymph nodes. Reproductive: Prostate is unremarkable. Other: 2 large anterior abdominal hernias as described above. Musculoskeletal: Stable scoliosis is noted of the thoracolumbar spine. 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 IMPRESSION: Two large anterior abdominal wall hernias both with a  somewhat bilobed appearance. The more inferior hernia shows evidence of a partial small bowel obstruction with definitive caliber change in the distal jejunum/ proximal ileum. Left renal calculi without obstructive changes. Chronic changes as described above. Electronically Signed   By: Alcide CleverMark  Lukens M.D.   On: 04/08/2016 08:22    Anti-infectives: Anti-infectives    None     Assessment/Plan Partial small bowel obstruction Nausea/vomiting  S/p multiple abdominal surgeries - CBC and CMET are WNL - NGT placed 10/10 - 850 cc/24h dark output - had a bowel movement today 10/11, pain and nausea improved - NGT clamp trial - Repeat abdominal film   Plan: follow films and continue NPO for now.  Discharge with outpatient follow up and elective repair vs. Hernia repair this admission    LOS: 0 days    Adam PhenixElizabeth S Simaan , [facility] 04/09/2016, 11:39 AM Pager: [phone] Consults: [phone] Mon-Fri 7:00 am-4:30 pm Sat-Sun 7:00 am-11:30 am

## 2016-04-09 NOTE — Progress Notes (Signed)
   Subjective: Patient was feeling better this morning. His nausea and vomiting hasn't improved after NG tube. He states that he had 2-3 bowel movements this morning. His abdominal pain has improved. Surgery have not seen him yet. He is interested in getting his hernia repair done so he will not have any more future problems partial bowel obstruction.  Objective:  Vital signs in last 24 hours: Vitals:   04/08/16 1035 04/08/16 1445 04/08/16 1956 04/09/16 0500  BP: (!) 149/97 (!) 127/95 127/72 133/69  Pulse: 96 83 83 88  Resp: 16 16 17 18   Temp: 98.5 F (36.9 C) 98 F (36.7 C) 98.1 F (36.7 C) 98.2 F (36.8 C)  TempSrc: Oral Oral Oral Oral  SpO2: 96% 93% 94% 95%  Weight:      Height:       Gen. well built, well-nourished man, in no acute distress. NG tube was in place. Abdomen. Soft, nontender, nondistended, bowel sounds mildly hypoactive. Lungs. Clear bilaterally CV. Regular rate and rhythm Extremities. No edema, no cyanosis, pulses 2+ bilaterally.  Assessment/Plan:  Mr. Dewaine CongerBarker is a 50yo man with PMH of multiple abdominal surgeries and abdominal hernias who presented with abdominal pain, vomiting and nausea. His last abdominal surgery was 2010.  CT contrast in the ED showed hernia with partial SBO.  Partial small bowel obstruction. Seems improving as he has a bowel movement this morning. Pain and nausea and vomiting has improved. Surgery has not seen him yet. -We will clamp his NG tube, and reassess his response. -Wait for surgery consultation. -Continue with Zofran for nausea if needed -Continue with Dilaudid. If needed for pain.  DVT ppx- [place] hep   Dispo: Anticipated discharge in approximately 1-2 day(s).   Arnetha CourserSumayya Melia Hopes, MD 04/09/2016, 11:44 AM Pager: [phone]

## 2016-04-09 NOTE — Progress Notes (Signed)
NGT clamped as ordered by MD. Tube to remain clamped for 4 hours

## 2016-04-10 ENCOUNTER — Inpatient Hospital Stay (HOSPITAL_COMMUNITY): Payer: Self-pay

## 2016-04-10 LAB — BASIC METABOLIC PANEL
ANION GAP: 6 (ref 5–15)
BUN: 10 mg/dL (ref 6–20)
CO2: 28 mmol/L (ref 22–32)
Calcium: 8.5 mg/dL — ABNORMAL LOW (ref 8.9–10.3)
Chloride: 105 mmol/L (ref 101–111)
Creatinine, Ser: 0.85 mg/dL (ref 0.61–1.24)
GFR calc Af Amer: >60 mL/min (ref >60)
GFR calc non Af Amer: >60 mL/min (ref >60)
GLUCOSE: 103 mg/dL — AB (ref 65–99)
POTASSIUM: 4 mmol/L (ref 3.5–5.1)
Sodium: 139 mmol/L (ref 135–145)

## 2016-04-10 NOTE — Progress Notes (Signed)
[facility] Progress Note     Subjective: Mild intermittent abdominal pain that is controlled. Denies nausea vomiting. Tolerated clears yesterday and a soft diet today for breakfast. Had a BM last night at 11:30 PM that was loose. + flatus, increased today from yesterday.  Objective: Vital signs in last 24 hours: Temp:  [97.5 F (36.4 C)-98.4 F (36.9 C)] 98.4 F (36.9 C) (10/12 0505) Pulse Rate:  [91-94] 91 (10/12 0505) Resp:  [17-18] 17 (10/12 0505) BP: (120-147)/(61-80) 132/79 (10/12 0505) SpO2:  [92 %-95 %] 92 % (10/12 0505) Last BM Date: 04/09/16  Intake/Output from previous day: 10/11 0701 - 10/12 0700 In: 1295 [P.O.:60; I.V.:1235] Out: 150 [Emesis/NG output:150] Intake/Output this shift: Total I/O In: -  Out: 225 [Urine:225]  PE: Gen:  Alert, NAD, pleasant Pulm:  CTA, no W/R/R Abd: Soft, NT/ND, +BS, large incision hernias that are soft, reducible, with no overlying skin changes Ext:  No erythema, edema, or tenderness  Lab Results:   Recent Labs  04/08/16 0633  WBC 10.3  HGB 15.9  HCT 48.7  PLT 296   BMET  Recent Labs  04/08/16 0633 04/10/16 0507  NA 138 139  K 4.0 4.0  CL 103 105  CO2 27 28  GLUCOSE 129* 103*  BUN 11 10  CREATININE 0.90 0.85  CALCIUM 9.1 8.5*   PT/INR No results for input(s): LABPROT, INR in the last 72 hours. CMP     Component Value Date/Time   NA 139 04/10/2016 0507   K 4.0 04/10/2016 0507   CL 105 04/10/2016 0507   CO2 28 04/10/2016 0507   GLUCOSE 103 (H) 04/10/2016 0507   BUN 10 04/10/2016 0507   CREATININE 0.85 04/10/2016 0507   CREATININE 0.82 09/27/2014 1206   CALCIUM 8.5 (L) 04/10/2016 0507   PROT 7.8 04/08/2016 0633   ALBUMIN 3.8 04/08/2016 0633   AST 28 04/08/2016 0633   ALT 31 04/08/2016 0633   ALKPHOS 81 04/08/2016 0633   BILITOT 0.3 04/08/2016 0633   GFRNONAA >60 04/10/2016 0507   GFRNONAA >89 09/27/2014 1206   GFRAA >60 04/10/2016 0507   GFRAA >89 09/27/2014 1206   Lipase      Component Value Date/Time   LIPASE 20 04/08/2016 16100633       Studies/Results: Dg Abd 2 Views  Result Date: 04/10/2016 CLINICAL DATA:  50 year old male with small bowel obstruction. Follow-up. Subsequent encounter. EXAM: ABDOMEN - 2 VIEW COMPARISON:  04/09/2016 plain film exam.  04/08/2016 CT. FINDINGS: Nasogastric tube curled within the stomach with the tip not completely imaged on present exam. Caliber of slightly dilated gas-filled small bowel loops left aspect of the abdomen (measuring up to 3.5 cm) unchanged. Previously noted small bowel fold thickening it is not appreciated on the present exam. This may reflect partial small bowel obstruction. Patient was noted to have anterior abdominal wall hernia containing colon and small portion of small bowel on recent CT. This is not as well delineated on present plain film exam. Upright view does not include the diaphragms and therefore cannot evaluate for the possibility of free air. Supine view with lucency right upper quadrant unchanged and felt unrelated to free air. Postcholecystectomy. Scoliosis lower thoracic and lumbar spine. IMPRESSION: Caliber of slightly dilated gas-filled small bowel loops left aspect of the abdomen (measuring up to 3.5 cm) unchanged. Previously noted small bowel fold thickening it is not appreciated on the present exam. This may reflect partial small bowel obstruction. Nasogastric tube coiled within the stomach. Upright view  does not include the diaphragms and therefore cannot evaluate for the possibility of free air. Electronically Signed   By: Lacy Duverney M.D.   On: 04/10/2016 09:14   Dg Abd 2 Views  Result Date: 04/09/2016 CLINICAL DATA:  Small bowel obstruction EXAM: ABDOMEN - 2 VIEW COMPARISON:  CT from yesterday FINDINGS: Nasogastric tube is coiled in the stomach with tip at the fundus. Mildly dilated loops of small bowel in the left abdomen are similar to scanogram from yesterday's CT, measuring up to 35 mm in  diameter. No evidence of pneumatosis or pneumoperitoneum. No concerning mass effect. Left nephrolithiasis. Cholecystectomy clips. Hemivertebra and levoscoliosis. IMPRESSION: 1. Unchanged bowel gas pattern compared to yesterday when there was partial small bowel obstruction by CT. 2. Nasogastric tube in good position. 3. Left nephrolithiasis. 4. Lower thoracic hemivertebra and scoliosis. Electronically Signed   By: Marnee Spring M.D.   On: 04/09/2016 13:40    Anti-infectives: Anti-infectives    None     Assessment/Plan Partial small bowel obstruction Nausea/vomiting  S/p multiple abdominal surgeries - CBC and CMET are WNL - NGT placed 10/10 - 850 cc/24h dark output  - NGT clamp trial with sips 10/11 - Repeat abdominal film stable  Plan: d/c NGT, continue soft diet, daily miralax PRN to prevent constipation and obstruction.  Hopefully, SBO will continue to resolve and patient can be discharged this afternoon vs tomorrow AM with outpatient follow up, weight loss, and plans for elective repair.   LOS: 1 day    Adam Phenix , [facility] 04/10/2016, 10:26 AM Pager: [phone] Consults: [phone] Mon-Fri 7:00 am-4:30 pm Sat-Sun 7:00 am-11:30 am

## 2016-04-10 NOTE — Progress Notes (Signed)
   Subjective: Patient was feeling better this morning. He was tolerating clear liquid diet very well. Denies any nausea or vomiting. He states that he had a total of 6-7 bowel movements yesterday last one was around 11:30 PM. They were mostly loose. He understands very well about his surgical planning for surgery wants him to lose weight before proceeding for any hernia repair.  Objective:  Vital signs in last 24 hours: Vitals:   04/09/16 0500 04/09/16 1302 04/09/16 2057 04/10/16 0505  BP: 133/69 120/61 (!) 147/80 132/79  Pulse: 88 94 92 91  Resp: 18 18 18 17   Temp: 98.2 F (36.8 C) 97.5 F (36.4 C) 98.3 F (36.8 C) 98.4 F (36.9 C)  TempSrc: Oral Oral Oral Oral  SpO2: 95% 95% 95% 92%  Weight:      Height:       Gen. well built, well-nourished man, in no acute distress. NG tube was in place. Abdomen. Soft, nontender, nondistended, bowel sounds positive. Lungs. Clear bilaterally CV. Regular rate and rhythm Extremities. No edema, no cyanosis, pulses 2+ bilaterally.  Labs. BMP Latest Ref Rng & Units 04/10/2016 04/08/2016 09/27/2014  Glucose 65 - 99 mg/dL 629(B103(H) 284(X129(H) 85  BUN 6 - 20 mg/dL 10 11 10   Creatinine 0.61 - 1.24 mg/dL 3.240.85 4.010.90 0.270.82  Sodium 135 - 145 mmol/L 139 138 142  Potassium 3.5 - 5.1 mmol/L 4.0 4.0 5.0  Chloride 101 - 111 mmol/L 105 103 105  CO2 22 - 32 mmol/L 28 27 26   Calcium 8.9 - 10.3 mg/dL 2.5(D8.5(L) 9.1 9.7   DG abdomen. IMPRESSION: Caliber of slightly dilated gas-filled small bowel loops left aspect of the abdomen (measuring up to 3.5 cm) unchanged. Previously noted small bowel fold thickening it is not appreciated on the present exam. This may reflect partial small bowel obstruction.  Assessment/Plan:  Mr. Dewaine CongerBarker is a 50yo man with PMH of multiple abdominal surgeries and abdominal hernias who presented with abdominal pain, vomiting and nausea. His last abdominal surgery was 2010. CT contrast in the ED showed hernia with partial SBO.  Partial small  bowel obstruction. Seems improving, tolerating his diet very well. He had 6-7 bowel movements yesterday. Surgery do not want to intervene at this time. They want him to lose at least 50 pounds for a better outcome of his hernia repair. -d/c NG tube. -Advanced diet as tolerated. -We will monitor him 1 more day, can be discharged home tomorrow if doing well. He needs to make an follow-up appointment with surgery later on for his hernia repair.  Dispo: Anticipated discharge in approximately 1 day(s).   Arnetha CourserSumayya Mehak Roskelley, MD 04/10/2016, 1:34 PM Pager: [phone]

## 2016-04-10 NOTE — Progress Notes (Signed)
  Date: 04/10/2016  Patient name: Tyrone Fernandez  Medical record number: [id number]  Date of birth: Sep 25, 1965   I have personally seen and evaluated this patient and the plan of care was discussed with the house staff. Please see Dr. Shanda BumpsAmin's note for complete details. I concur with her findings.  Likely discharge tomorrow if continues to do well.   Inez CatalinaEmily B Arminta Gamm, MD 04/10/2016, 4:16 PM

## 2016-04-11 NOTE — Progress Notes (Signed)
Pt is feeling a lot better today. Tolerating soft diet, had BM this morning. Denies abd pain. Discharge instructions given to pt, verbalized understanding. Discharged home.

## 2016-04-11 NOTE — Progress Notes (Signed)
   Subjective: Patient was feeling better this morning, he was ready to go home. There is tolerating regular diet without any problem. Had multiple BMs. He understands very well that he should lose some weight to have a better outcome of his hernia repair.  Objective:  Vital signs in last 24 hours: Vitals:   04/10/16 0505 04/10/16 1403 04/10/16 2059 04/11/16 0449  BP: 132/79 127/76 132/88 115/79  Pulse: 91 84 87 83  Resp: 17 18 17 18   Temp: 98.4 F (36.9 C) 99.1 F (37.3 C) 98.5 F (36.9 C) 98.3 F (36.8 C)  TempSrc: Oral Oral Oral Oral  SpO2: 92% 95% 94% 94%  Weight:      Height:       Gen.well built, well-nourished man, in no acute distress.NG tube was in place. Abdomen. Soft, nontender, nondistended, bowel sounds positive. Lungs. Clear bilaterally CV. Regular rate and rhythm Extremities.No edema, no cyanosis, pulses 2+ bilaterally.  Assessment/Plan:  Mr. Tyrone Fernandez is a 50yo man with PMH of multiple abdominal surgeries and abdominal hernias who presented with abdominal pain, vomiting and nausea. His last abdominal surgery was 2010. CT contrast in the ED showed hernia with partial SBO.  Partial small bowel obstruction. Has been ressolved,. He is tolerating his regular diet very well without any problem. He had 3-4 bowel movements yesterday. He will follow-up with his surgeon regarding his hernia repair, he understands very well that he should lose some weight but it better outcome.  Dispo: Being discharged today.  Arnetha CourserSumayya Ovidio Steele, MD 04/11/2016, 11:50 AM Pager: [phone]

## 2016-04-11 NOTE — Progress Notes (Signed)
Subjective: Seated in chair dressed in street clothes and sneakers and states "I am ready to go to the house." No issues overnight. Tolerating regular diet. Multiple BMs.  Objective: Vital signs in last 24 hours: Temp:  [98.3 F (36.8 C)-99.1 F (37.3 C)] 98.3 F (36.8 C) (10/13 0449) Pulse Rate:  [83-87] 83 (10/13 0449) Resp:  [17-18] 18 (10/13 0449) BP: (115-132)/(76-88) 115/79 (10/13 0449) SpO2:  [94 %-95 %] 94 % (10/13 0449) Last BM Date: 04/11/16  Intake/Output from previous day: 10/12 0701 - 10/13 0700 In: 720 [P.O.:720] Out: 225 [Urine:225] Intake/Output this shift: No intake/output data recorded.  PE: General: pleasant, WD, WN, obese in NAD HEENT:   Mouth is pink and moist Heart: regular, rate, and rhythm.  Lungs: CTAB, no wheezes, rhonchi, or rales noted.  Respiratory effort nonlabored Abd: soft, obese, NT, ND, +BS MS: all 4 extremities are symmetrical with no cyanosis, clubbing, or edema. Skin: warm and dry with no masses, lesions, or rashes Psych: A&Ox3 with an appropriate affect.  Lab Results:  No results for input(s): WBC, HGB, HCT, PLT in the last 72 hours. BMET  Recent Labs  04/10/16 0507  NA 139  K 4.0  CL 105  CO2 28  GLUCOSE 103*  BUN 10  CREATININE 0.85  CALCIUM 8.5*   PT/INR No results for input(s): LABPROT, INR in the last 72 hours. CMP     Component Value Date/Time   NA 139 04/10/2016 0507   K 4.0 04/10/2016 0507   CL 105 04/10/2016 0507   CO2 28 04/10/2016 0507   GLUCOSE 103 (H) 04/10/2016 0507   BUN 10 04/10/2016 0507   CREATININE 0.85 04/10/2016 0507   CREATININE 0.82 09/27/2014 1206   CALCIUM 8.5 (L) 04/10/2016 0507   PROT 7.8 04/08/2016 0633   ALBUMIN 3.8 04/08/2016 0633   AST 28 04/08/2016 0633   ALT 31 04/08/2016 0633   ALKPHOS 81 04/08/2016 0633   BILITOT 0.3 04/08/2016 0633   GFRNONAA >60 04/10/2016 0507   GFRNONAA >89 09/27/2014 1206   GFRAA >60 04/10/2016 0507   GFRAA >89 09/27/2014 1206   Lipase      Component Value Date/Time   LIPASE 20 04/08/2016 16100633       Studies/Results: Dg Abd 2 Views  Result Date: 04/10/2016 CLINICAL DATA:  50 year old male with small bowel obstruction. Follow-up. Subsequent encounter. EXAM: ABDOMEN - 2 VIEW COMPARISON:  04/09/2016 plain film exam.  04/08/2016 CT. FINDINGS: Nasogastric tube curled within the stomach with the tip not completely imaged on present exam. Caliber of slightly dilated gas-filled small bowel loops left aspect of the abdomen (measuring up to 3.5 cm) unchanged. Previously noted small bowel fold thickening it is not appreciated on the present exam. This may reflect partial small bowel obstruction. Patient was noted to have anterior abdominal wall hernia containing colon and small portion of small bowel on recent CT. This is not as well delineated on present plain film exam. Upright view does not include the diaphragms and therefore cannot evaluate for the possibility of free air. Supine view with lucency right upper quadrant unchanged and felt unrelated to free air. Postcholecystectomy. Scoliosis lower thoracic and lumbar spine. IMPRESSION: Caliber of slightly dilated gas-filled small bowel loops left aspect of the abdomen (measuring up to 3.5 cm) unchanged. Previously noted small bowel fold thickening it is not appreciated on the present exam. This may reflect partial small bowel obstruction. Nasogastric tube coiled within the stomach. Upright view does not include the diaphragms and therefore cannot  evaluate for the possibility of free air. Electronically Signed   By: Lacy Duverney M.D.   On: 04/10/2016 09:14   Dg Abd 2 Views  Result Date: 04/09/2016 CLINICAL DATA:  Small bowel obstruction EXAM: ABDOMEN - 2 VIEW COMPARISON:  CT from yesterday FINDINGS: Nasogastric tube is coiled in the stomach with tip at the fundus. Mildly dilated loops of small bowel in the left abdomen are similar to scanogram from yesterday's CT, measuring up to 35 mm in  diameter. No evidence of pneumatosis or pneumoperitoneum. No concerning mass effect. Left nephrolithiasis. Cholecystectomy clips. Hemivertebra and levoscoliosis. IMPRESSION: 1. Unchanged bowel gas pattern compared to yesterday when there was partial small bowel obstruction by CT. 2. Nasogastric tube in good position. 3. Left nephrolithiasis. 4. Lower thoracic hemivertebra and scoliosis. Electronically Signed   By: Marnee Spring M.D.   On: 04/09/2016 13:40    Anti-infectives: Anti-infectives    None       Assessment/Plan SBO: now with clinical resolution. Appropriate for discharge to home. Will be staying with his girlfriend.   LOS: 2 days    Tyrone Fernandez, [facility] 04/11/2016, 9:10 AM

## 2016-04-11 NOTE — Discharge Summary (Signed)
Name: Tyrone Fernandez MRN: [id number] DOB: 04-29-1966 50 y.o. PCP: Doris Cheadle, MD  Date of Admission: 04/08/2016  6:25 AM Date of Discharge: 04/11/2016 Attending Physician: No att. providers found  Discharge Diagnosis: 1. Small bowel obstruction 2. Hernia of anterior abdominal wall  Discharge Medications:   Medication List    STOP taking these medications   azithromycin 250 MG tablet Commonly known as:  ZITHROMAX Z-PAK   benzonatate 100 MG capsule Commonly known as:  TESSALON   predniSONE 20 MG tablet Commonly known as:  DELTASONE     TAKE these medications   albuterol 108 (90 Base) MCG/ACT inhaler Commonly known as:  PROVENTIL HFA;VENTOLIN HFA Inhale 1-2 puffs into the lungs every 6 (six) hours as needed for wheezing or shortness of breath.   dextromethorphan-guaiFENesin 30-600 MG 12hr tablet Commonly known as:  MUCINEX DM Take 1 tablet by mouth 2 (two) times daily.   fluticasone 50 MCG/ACT nasal spray Commonly known as:  FLONASE Place 2 sprays into both nostrils daily.   pseudoephedrine 120 MG 12 hr tablet Commonly known as:  SUDAFED 12 HOUR Take 1 tablet (120 mg total) by mouth 2 (two) times daily.   Vitamin D (Ergocalciferol) 50000 units Caps capsule Commonly known as:  DRISDOL Take 1 capsule (50,000 Units total) by mouth every 7 (seven) days.       Disposition and follow-up:   Tyrone Fernandez was discharged from [facility] in Good condition.  At the hospital follow up visit please address:  1.  His progress in losing weight. Any recurrent symptoms of nausea and vomiting.  2.  Labs / imaging needed at time of follow-up: None  3.  Pending labs/ test needing follow-up: None  Follow-up Appointments: Follow-up Information    [facility]. Schedule an appointment as soon as possible for a visit today.   Contact information: [address] [place] [address] [phone]          Hospital Course.  Tyrone Fernandez is a 50yo man with PMH of multiple abdominal surgeries and abdominal hernias who presented with abdominal pain, vomiting and nausea. His last abdominal surgery was 2010. CT contrast in the ED showed hernia with partial SBO. He was managed conservatively, with being nothing by mouth, and NG tube was placed for decompression and IV fluid resuscitation. He started improving very next day, his nausea and vomiting and abdominal pain improved. When we clamped his NG tube, he remains asymptomatic. Started tolerating clear liquids. He started getting bowel movements and tolerated advancement in his diet very well. He was very interested in getting his hernia repair done during this stay, he had a long discussion with surgeon, who advised him to lose at least 30-50 pound for a better outcome of his hernia repair surgery. He was advised to follow-up with his surgeon as an outpatient.  Discharge Vitals:   BP 115/79 (BP Location: Right Arm)   Pulse 83   Temp 98.3 F (36.8 C) (Oral)   Resp 18   Ht 5\' 10"  (1.778 m)   Wt 287 lb (130.2 kg)   SpO2 94%   BMI 41.18 kg/m   Gen.well built, well-nourished man, in no acute distress.NG tube was in place. Abdomen. Soft, nontender, nondistended, bowel sounds positive. Lungs. Clear bilaterally CV. Regular rate and rhythm Extremities.No edema, no cyanosis, pulses 2+ bilaterally.  Pertinent Labs, Studies, and Procedures:  CBC Latest Ref Rng & Units 04/08/2016 09/27/2014 09/03/2014  WBC 4.0 -  10.5 K/uL 10.3 10.1 13.0(H)  Hemoglobin 13.0 - 17.0 g/dL 16.115.9 09.614.8 04.513.4  Hematocrit 39.0 - 52.0 % 48.7 45.4 40.5  Platelets 150 - 400 K/uL 296 364 366   CMP Latest Ref Rng & Units 04/10/2016 04/08/2016 09/27/2014  Glucose 65 - 99 mg/dL 409(W103(H) 119(J129(H) 85  BUN 6 - 20 mg/dL 10 11 10   Creatinine 0.61 - 1.24 mg/dL 4.780.85 2.950.90 6.210.82  Sodium 135 - 145 mmol/L 139 138 142  Potassium 3.5 - 5.1 mmol/L 4.0 4.0 5.0  Chloride 101 - 111 mmol/L 105  103 105  CO2 22 - 32 mmol/L 28 27 26   Calcium 8.9 - 10.3 mg/dL 3.0(Q8.5(L) 9.1 9.7  Total Protein 6.5 - 8.1 g/dL - 7.8 7.7  Total Bilirubin 0.3 - 1.2 mg/dL - 0.3 0.3  Alkaline Phos 38 - 126 U/L - 81 87  AST 15 - 41 U/L - 28 24  ALT 17 - 63 U/L - 31 26   CT abdomen and pelvis with contrast. IMPRESSION: Two large anterior abdominal wall hernias both with a somewhat bilobed appearance. The more inferior hernia shows evidence of a partial small bowel obstruction with definitive caliber change in the distal jejunum/ proximal ileum.  Left renal calculi without obstructive changes.  Discharge Instructions: Discharge Instructions    Call MD for:  persistant nausea and vomiting    Complete by:  As directed    Diet - low sodium heart healthy    Complete by:  As directed    Discharge instructions    Complete by:  As directed    It was pleasure taking care of you. Please make a follow up appointment with your surgeon. Eat healthy and exercise, so you can loose some weight for the better out come of your hernia repair.   Increase activity slowly    Complete by:  As directed       Signed: Arnetha CourserSumayya Tamar Miano, MD 04/11/2016, 5:57 PM   Pager: [phone]

## 2016-09-29 IMAGING — DX DG CHEST 2V
2 series · 2 of 2 positions shown · non-contrast
Comparison: 08/26/2014

CLINICAL DATA: Heavy chest and cough for 2 weeks. Diagnosed with
bronchitis last week.

EXAM:
CHEST  2 VIEW

[chest pa]
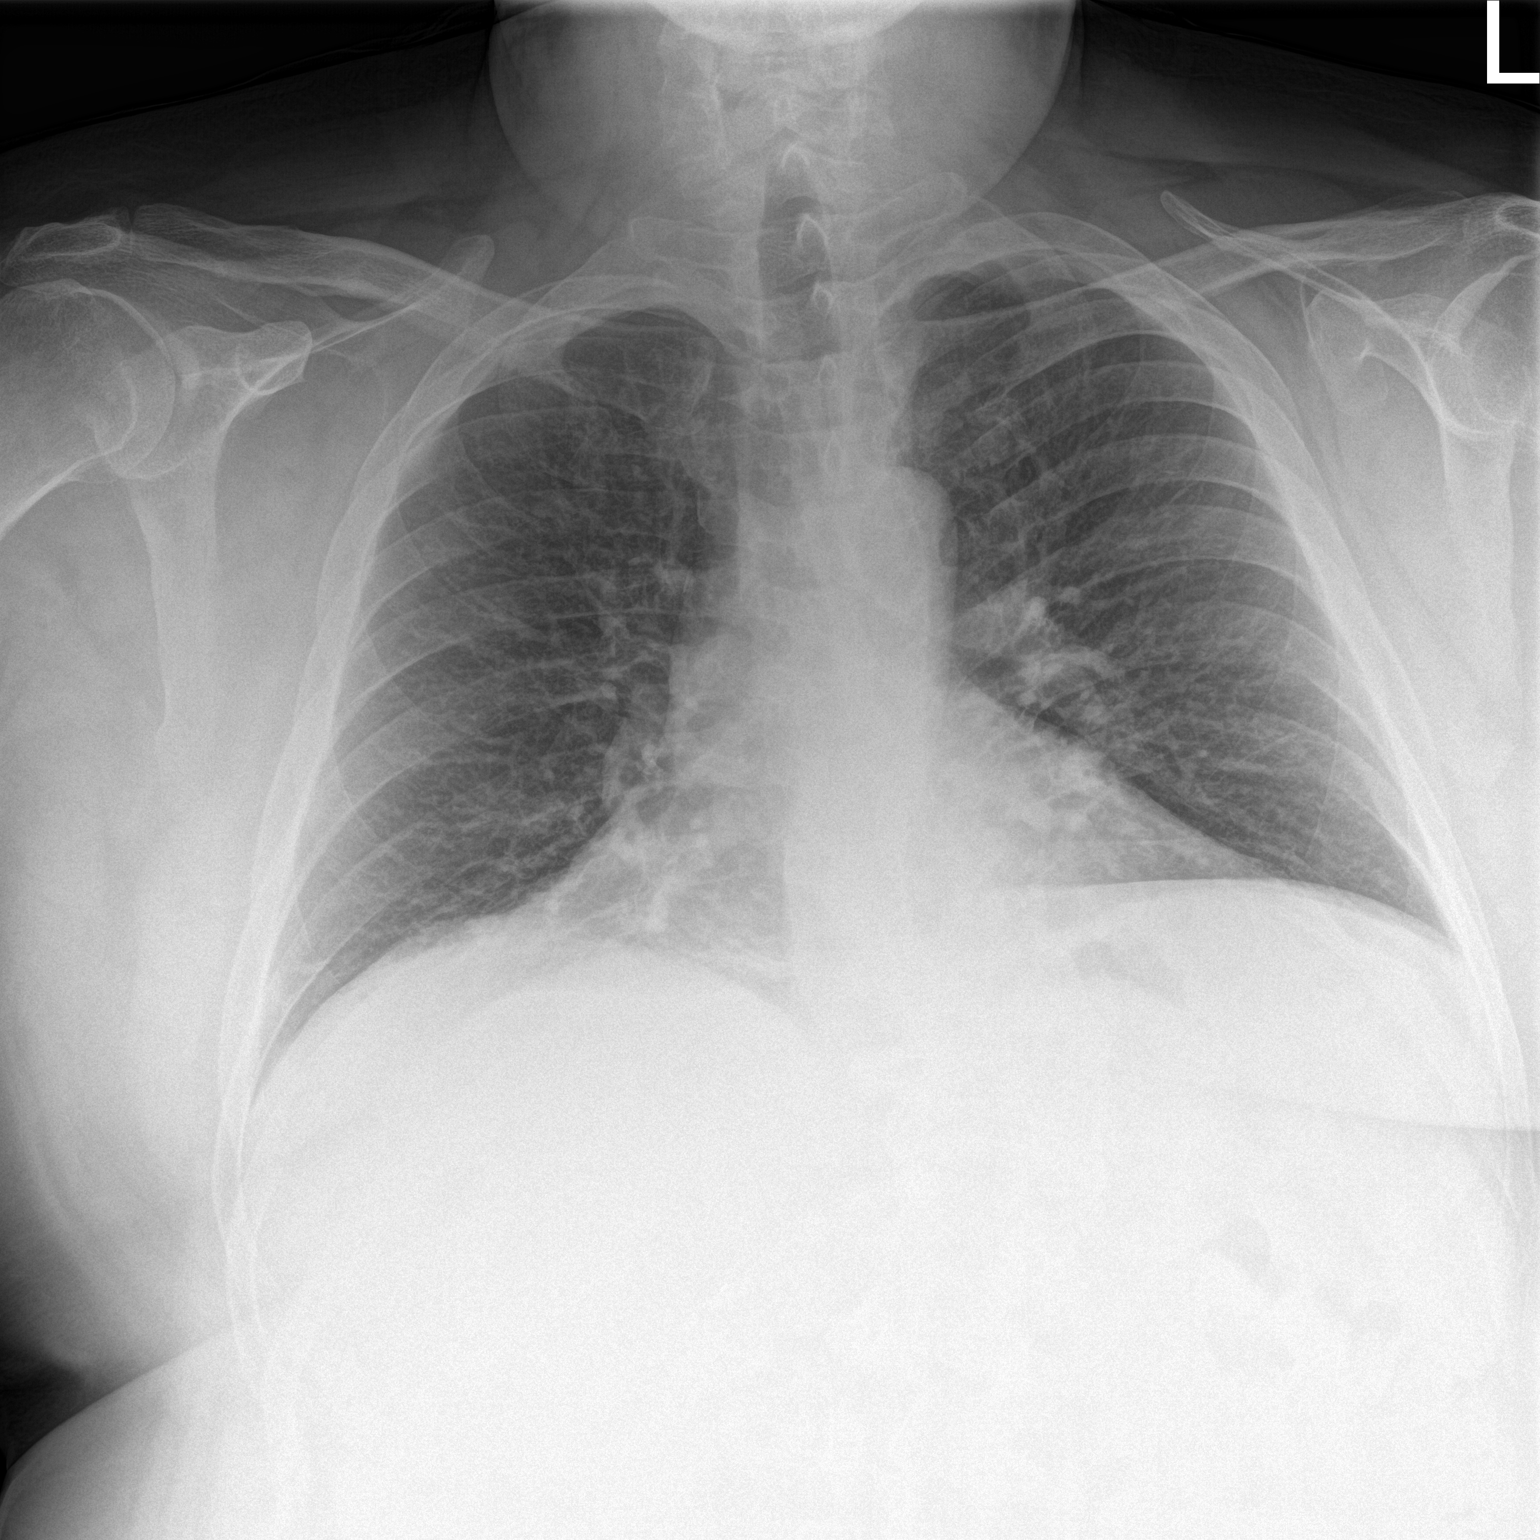

[chest lat]
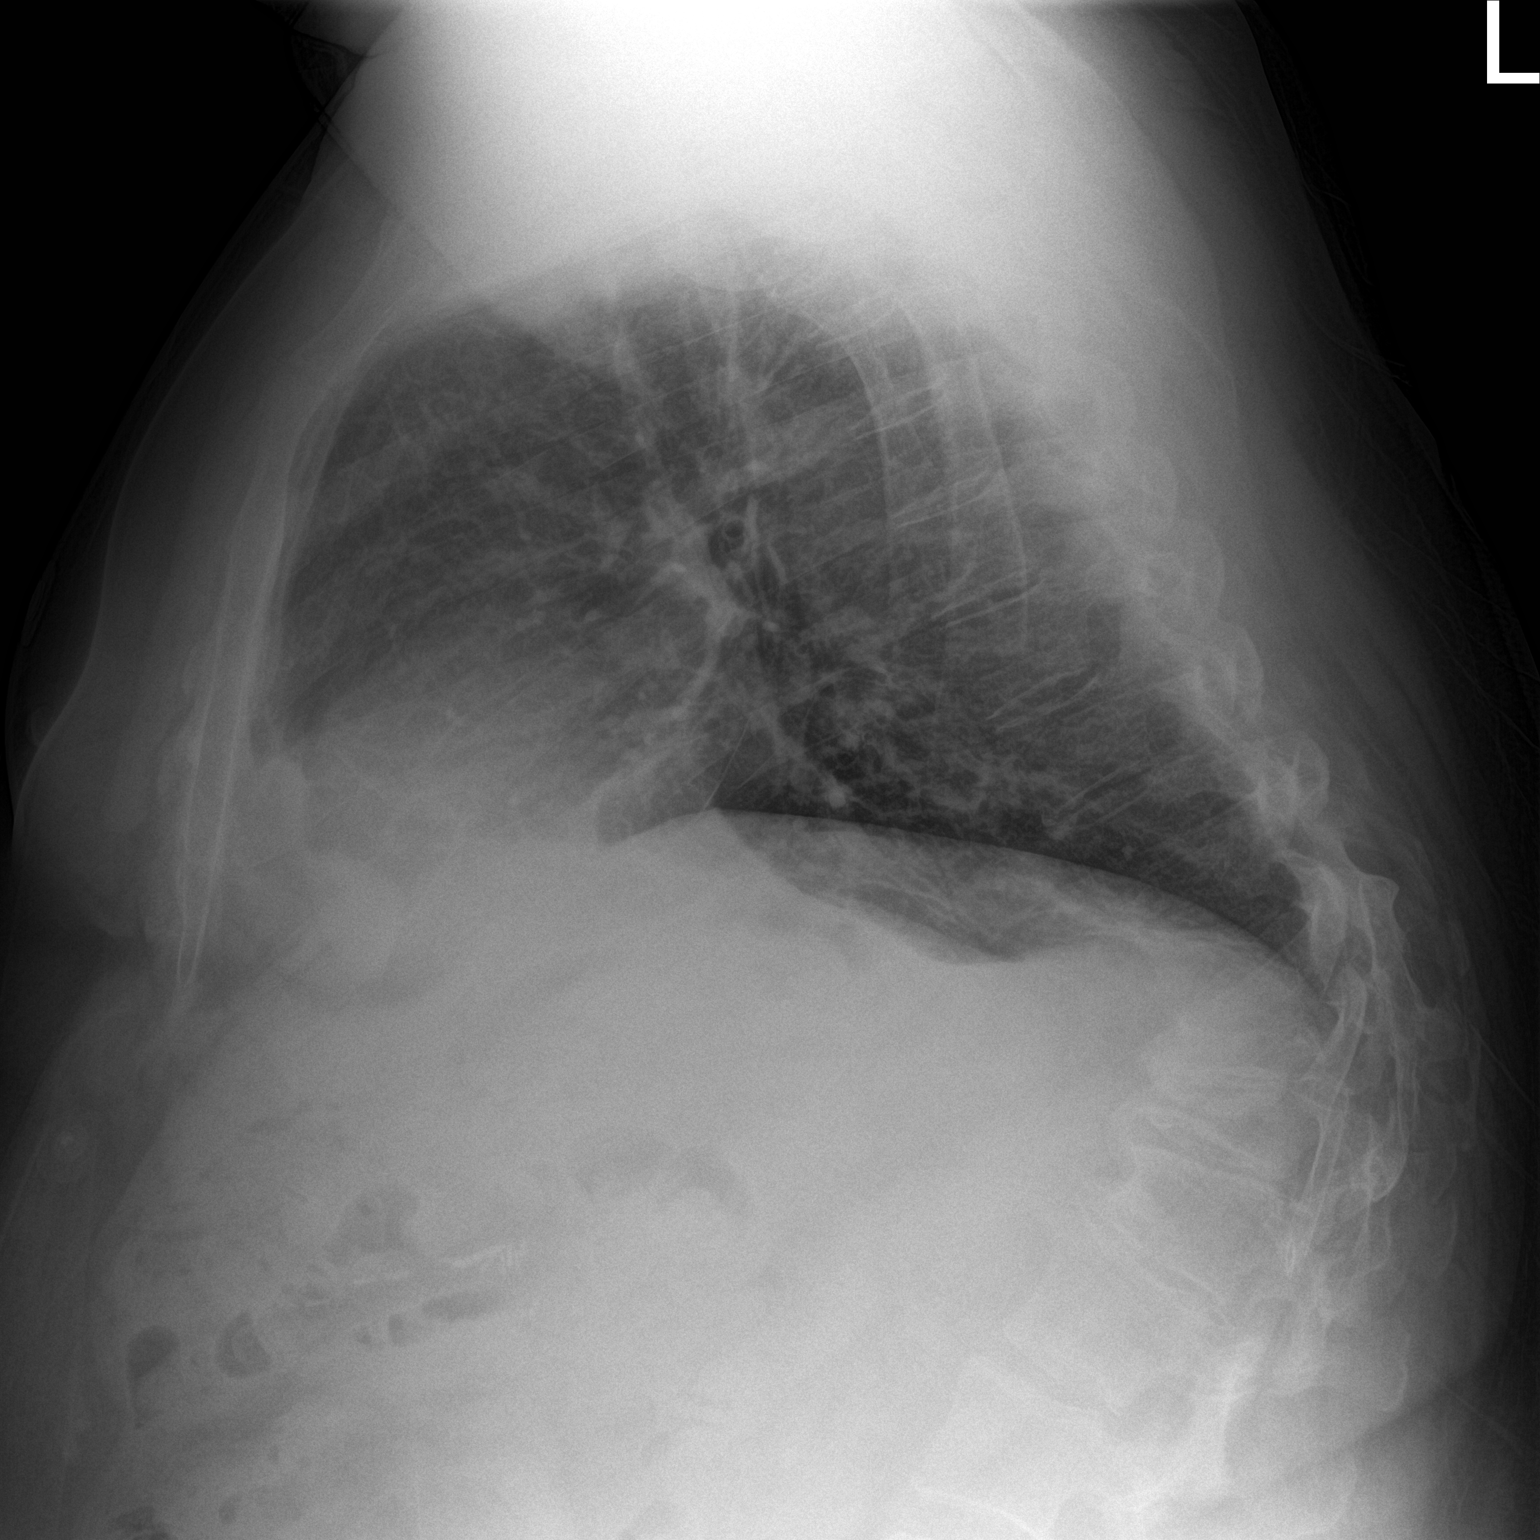

[2 of 2 positions shown; findings below may reference images not displayed]

FINDINGS: Shallow inspiration. Linear atelectasis or fibrosis in the right
greater than left lung base. Normal heart size and pulmonary
vascularity. No focal airspace disease or consolidation in the
lungs. No blunting of costophrenic angles. No pneumothorax.
Mediastinal contours appear intact. Old compression deformities of
lower thoracic vertebrae. No change since previous study.
IMPRESSION: Shallow inspiration with linear atelectasis or fibrosis in the lung
bases. No developing consolidation.

## 2017-11-07 ENCOUNTER — Emergency Department
Admission: EM | Admit: 2017-11-07 | Discharge: 2017-11-07 | Disposition: A | Discharge status: Home / Self Care | Payer: Self-pay | Attending: Emergency Medicine | Admitting: Emergency Medicine

## 2017-11-07 ENCOUNTER — Other Ambulatory Visit: Payer: Self-pay

## 2017-11-07 ENCOUNTER — Encounter: Payer: Self-pay | Admitting: Emergency Medicine

## 2017-11-07 ENCOUNTER — Emergency Department: Payer: Self-pay

## 2017-11-07 DIAGNOSIS — R0989 Other specified symptoms and signs involving the circulatory and respiratory systems: Secondary | ICD-10-CM | POA: Insufficient documentation

## 2017-11-07 DIAGNOSIS — K228 Other specified diseases of esophagus: Secondary | ICD-10-CM

## 2017-11-07 DIAGNOSIS — R198 Other specified symptoms and signs involving the digestive system and abdomen: Secondary | ICD-10-CM

## 2017-11-07 DIAGNOSIS — K219 Gastro-esophageal reflux disease without esophagitis: Secondary | ICD-10-CM | POA: Insufficient documentation

## 2017-11-07 MED ORDER — OMEPRAZOLE 10 MG PO CPDR: 10.0000 mg | DELAYED_RELEASE_CAPSULE | Freq: Every day | ORAL | 2 refills | Status: DC

## 2017-11-07 MED ORDER — GI COCKTAIL ~~LOC~~
30.0000 mL | Freq: Once | ORAL | Status: AC
  Administered 2017-11-07: 30 mL via ORAL
  Filled 2017-11-07: qty 30

## 2017-11-07 NOTE — ED Provider Notes (Signed)
[facility] Emergency Department Provider Note  ____________________________________________  Time seen: Approximately 10:13 PM  I have reviewed the triage vital signs and the nursing notes.   HISTORY  Chief Complaint Swallowed Foreign Body (feels like food caught in chest)    HPI Tyrone Fernandez is a 52 y.o. male who presents the emergency department complaining of a foreign body sensation in his esophagus.  Patient reports that he was eating a piece of steak, took a bite of a piece that was too large, again to choke.  Patient reports that he felt like it stuck in his distal esophagus.Patient reports that he is trying to drink a cup of water right after the sensation and either coughed or regurgitated the water.  Patient denies throwing up any of the meat or previous food material he was eating.  Patient reports that he does have a history of reflux and has had increased reflux symptoms over the past several days.  Patient is not taking medications prior to arrival.  He is able to breathe and talk with no difficulty.  Past Medical History:  Diagnosis Date  . Chronic kidney disease    patient denies  . H/O hiatal hernia   . History of kidney stones   . Renal stones   . SBO (small bowel obstruction) (HCC) 03/2016    Patient Active Problem List   Diagnosis Date Noted  . Hernia of anterior abdominal wall   . SBO (small bowel obstruction) (HCC) 04/08/2016  . History of bronchitis 09/27/2014  . Cough 09/27/2014  . Obesity 09/27/2014    Past Surgical History:  Procedure Laterality Date  . 1984  KNEE SURGERY    . 2007 OR 2008  KIDNEY STONE REMOVED    . APPENDECTOMY  2003   DONE AT TIME OF COLON RESECTION  . CHOLECYSTECTOMY  2010   AT SAME TIME OF HERNIA REPAIR  . COLON SURGERY  2003   COLON RESECTION FOR DIVERTICULITIS / COLOSTOMY  . COLOSTOMY REVERSAL  2003  . CYSTOSCOPY WITH URETEROSCOPY AND STENT PLACEMENT Right 05/03/2013   Procedure: CYSTOSCOPY,  RIGHT RETROGRADE PYELOGRAMRIGHT URETEROSCOPY, STONE EXTRACTION  AND STENT PLACEMENT;  Surgeon: Antony Haste, MD;  Location: [facility];  Service: Urology;  Laterality: Right;  . HERNIA REPAIR  2010    Prior to Admission medications   Medication Sig Start Date End Date Taking? Authorizing Provider  albuterol (PROVENTIL HFA;VENTOLIN HFA) 108 (90 BASE) MCG/ACT inhaler Inhale 1-2 puffs into the lungs every 6 (six) hours as needed for wheezing or shortness of breath. Patient not taking: Reported on 04/08/2016 09/03/14   Joycie Peek, PA-C  dextromethorphan-guaiFENesin [facility] DM) 30-600 MG per 12 hr tablet Take 1 tablet by mouth 2 (two) times daily. Patient not taking: Reported on 04/08/2016 08/26/14   Everlene Farrier, PA-C  fluticasone [facility]) 50 MCG/ACT nasal spray Place 2 sprays into both nostrils daily. Patient not taking: Reported on 04/08/2016 08/26/14   Everlene Farrier, PA-C  omeprazole (PRILOSEC) 10 MG capsule Take 1 capsule (10 mg total) by mouth daily. 11/07/17   Ilian Wessell, Delorise Royals, PA-C  pseudoephedrine (SUDAFED 12 HOUR) 120 MG 12 hr tablet Take 1 tablet (120 mg total) by mouth 2 (two) times daily. Patient not taking: Reported on 04/08/2016 08/26/14   Everlene Farrier, PA-C  Vitamin D, Ergocalciferol, (DRISDOL) 50000 UNITS CAPS capsule Take 1 capsule (50,000 Units total) by mouth every 7 (seven) days. Patient not taking: Reported on 04/08/2016 10/06/14   Doris Cheadle, MD  Allergies Patient has no known allergies.  Family History  Problem Relation Age of Onset  . Diabetes Mother   . Cancer Mother        breast cancer   . Stroke Maternal Aunt   . Heart disease Paternal Grandfather     Social History Social History   Tobacco Use  . Smoking status: Never Smoker  . Smokeless tobacco: Never Used  Substance Use Topics  . Alcohol use: No  . Drug use: No     Review of Systems  Constitutional: No fever/chills Eyes: No visual changes. No discharge ENT: No upper  respiratory complaints. Cardiovascular: no chest pain. Respiratory: no cough. No SOB. Gastrointestinal: Foreign body sensation in the esophagus.  Recent GERD symptoms.  No abdominal pain.  No nausea, no vomiting.  No diarrhea.  No constipation. Musculoskeletal: Negative for musculoskeletal pain. Skin: Negative for rash, abrasions, lacerations, ecchymosis. Neurological: Negative for headaches, focal weakness or numbness. 10-point ROS otherwise negative.  ____________________________________________   PHYSICAL EXAM:  VITAL SIGNS: ED Triage Vitals  Enc Vitals Group     BP 11/07/17 2121 (!) 158/94     Pulse Rate 11/07/17 2121 (!) 101     Resp 11/07/17 2121 18     Temp 11/07/17 2121 98.1 F (36.7 C)     Temp Source 11/07/17 2121 Oral     SpO2 11/07/17 2121 96 %     Weight 11/07/17 2122 (!) 315 lb (142.9 kg)     Height 11/07/17 2122  (1.778 m)     Head Circumference --      Peak Flow --      Pain Score 11/07/17 2122 0     Pain Loc --      Pain Edu? --      Excl. in GC? --      Constitutional: Alert and oriented. Well appearing and in no acute distress. Eyes: Conjunctivae are normal. PERRL. EOMI. Head: Atraumatic. ENT:      Ears:       Nose: No congestion/rhinnorhea.      Mouth/Throat: Mucous membranes are moist.  No oropharyngeal trauma.  No visible foreign body. Neck: No stridor.    Cardiovascular: Normal rate, regular rhythm. Normal S1 and S2.  Good peripheral circulation. Respiratory: Normal respiratory effort without tachypnea or retractions. Lungs CTAB. Good air entry to the bases with no decreased or absent breath sounds. Gastrointestinal: Bowel sounds 4 quadrants. Soft and nontender to palpation. No guarding or rigidity. No palpable masses. No distention.  Musculoskeletal: Full range of motion to all extremities. No gross deformities appreciated. Neurologic:  Normal speech and language. No gross focal neurologic deficits are appreciated.  Skin:  Skin is warm,  dry and intact. No rash noted. Psychiatric: Mood and affect are normal. Speech and behavior are normal. Patient exhibits appropriate insight and judgement.   ____________________________________________   LABS (all labs ordered are listed, but only abnormal results are displayed)  Labs Reviewed - No data to display ____________________________________________  EKG   ____________________________________________  RADIOLOGY Festus Barren Nanda Bittick, personally viewed and evaluated these images (plain radiographs) as part of my medical decision making, as well as reviewing the written report by the radiologist.  Dg Chest 2 View  Result Date: 11/07/2017 CLINICAL DATA:  Patient choked on a piece of steak. Patient feels something in the mid chest. EXAM: CHEST - 2 VIEW COMPARISON:  09/03/2014 FINDINGS: The heart size and mediastinal contours are stable. No dilatation of the esophagus. No radiopaque foreign body.  No evidence of aspiration pneumonia. No overt pulmonary edema. 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 IMPRESSION: No active cardiopulmonary disease. No radiopaque foreign body is identified. Electronically Signed   By: Tollie Eth M.D.   On: 11/07/2017 22:03    ____________________________________________    PROCEDURES  Procedure(s) performed:    Procedures    Medications  gi cocktail (Maalox,Lidocaine,Donnatal) (has no administration in time range)     ____________________________________________   INITIAL IMPRESSION / ASSESSMENT AND PLAN / ED COURSE  Pertinent labs & imaging results that were available during my care of the patient were reviewed by me and considered in my medical decision making (see chart for details).  Review of the [place] CSRS was performed in accordance of the [facility] prior to dispensing any controlled drugs.  Clinical Course as of Nov 08 2239  Sat Nov 07, 2017  2239 Patient presents the emergency department  complaining of a foreign body sensation in the esophagus.  Patient reports that he was eating a large piece state, became choked and felt like food lodged in his distal esophagus.  Patient had tried drinking water immediately afterwards and either coughed or regurgitated the water.  Patient continues with a tight sensation in his chest/lower esophagus region.  X-ray revealed no radiopaque foreign body.  Patient was able to maintain his own secretions with no coughing or choking.  Patient was given water with supervision.  Initially, I feel there was a large anxiety component as patient felt like water was unable to pass.  However he was able to finish 12 ounces of water with no choking, coughing, regurgitation.  At this time, no indication of foreign body.  Patient will be given GI cocktail for symptom improvement.   [JC]    Clinical Course User Index [JC] Thatiana Renbarger, Delorise Royals, PA-C    Patient's diagnosis is consistent with foreign body sensation in the esophagus and reflux disease.  Patient presents with foreign body sensation in the lower esophagus.  X-ray was negative.  Exam was reassuring.  Patient was given p.o. fluids which she maintained without any coughing, regurgitation.  GI cocktail for symptom improvement.. Patient will be discharged home with prescriptions for Prilosec for GERD symptoms. Patient is to follow up with [facility] enterology as needed or otherwise directed. Patient is given ED precautions to return to the ED for any worsening or new symptoms.     ____________________________________________  FINAL CLINICAL IMPRESSION(S) / ED DIAGNOSES  Final diagnoses:  Sensation of foreign body in esophagus  Gastroesophageal reflux disease, esophagitis presence not specified      NEW MEDICATIONS STARTED DURING THIS VISIT:  ED Discharge Orders        Ordered    omeprazole (PRILOSEC) 10 MG capsule  Daily     11/07/17 2236          This chart was dictated using voice  recognition software/Dragon. Despite best efforts to proofread, errors can occur which can change the meaning. Any change was purely unintentional.    Racheal Patches, PA-C 11/07/17 2241    Myrna Blazer, MD 11/07/17 2328

## 2017-11-07 NOTE — ED Notes (Signed)
Patient transported to X-ray 

## 2017-11-07 NOTE — ED Triage Notes (Signed)
Pt states he choked on steak and states he feels like its stuck in his throat/chest. Pt points to mid chest. Talking in full sentences with stable vital sign. Also states gets panic attacks.

## 2019-04-15 ENCOUNTER — Other Ambulatory Visit: Payer: Self-pay

## 2019-04-15 DIAGNOSIS — Z20822 Contact with and (suspected) exposure to covid-19: Secondary | ICD-10-CM

## 2019-04-17 LAB — NOVEL CORONAVIRUS, NAA: SARS-CoV-2, NAA: NOT DETECTED

## 2020-01-30 ENCOUNTER — Other Ambulatory Visit: Payer: Self-pay

## 2020-01-30 ENCOUNTER — Encounter (HOSPITAL_COMMUNITY): Payer: Self-pay | Admitting: Emergency Medicine

## 2020-01-30 ENCOUNTER — Ambulatory Visit (HOSPITAL_COMMUNITY)
Admission: EM | Admit: 2020-01-30 | Discharge: 2020-01-30 | Disposition: A | Discharge status: Home / Self Care | Payer: Self-pay

## 2020-01-30 DIAGNOSIS — R300 Dysuria: Secondary | ICD-10-CM | POA: Insufficient documentation

## 2020-01-30 DIAGNOSIS — R350 Frequency of micturition: Secondary | ICD-10-CM | POA: Insufficient documentation

## 2020-01-30 DIAGNOSIS — H1032 Unspecified acute conjunctivitis, left eye: Secondary | ICD-10-CM | POA: Insufficient documentation

## 2020-01-30 DIAGNOSIS — N390 Urinary tract infection, site not specified: Secondary | ICD-10-CM

## 2020-01-30 DIAGNOSIS — H5712 Ocular pain, left eye: Secondary | ICD-10-CM | POA: Insufficient documentation

## 2020-01-30 DIAGNOSIS — N3000 Acute cystitis without hematuria: Secondary | ICD-10-CM | POA: Insufficient documentation

## 2020-01-30 LAB — POCT URINALYSIS DIP (DEVICE)
Bilirubin Urine: NEGATIVE
Glucose, UA: 100 mg/dL — AB
Hgb urine dipstick: NEGATIVE
Leukocytes,Ua: NEGATIVE
Nitrite: POSITIVE — AB
Protein, ur: NEGATIVE mg/dL
Specific Gravity, Urine: 1.025 (ref 1.005–1.030)
Urobilinogen, UA: 1 mg/dL (ref 0.0–1.0)
pH: 5.5 (ref 5.0–8.0)

## 2020-01-30 MED ORDER — CEFDINIR 300 MG PO CAPS
300.0000 mg | ORAL_CAPSULE | Freq: Two times a day (BID) | ORAL | 0 refills | Status: DC
Start: 2020-01-30 — End: 2021-08-19

## 2020-01-30 MED ORDER — ERYTHROMYCIN 5 MG/GM OP OINT
TOPICAL_OINTMENT | OPHTHALMIC | 0 refills | Status: DC
Start: 2020-01-30 — End: 2021-08-19

## 2020-01-30 MED ORDER — NAPROXEN 500 MG PO TABS
500.0000 mg | ORAL_TABLET | Freq: Two times a day (BID) | ORAL | 0 refills | Status: DC
Start: 2020-01-30 — End: 2021-08-23

## 2020-01-30 NOTE — ED Provider Notes (Signed)
[facility]   MRN: [id number] DOB: June 28, 1966  Subjective:   Tyrone Fernandez is a 54 y.o. male presenting for 4-day history of acute onset dysuria, urinary frequency.  Symptoms have improved following use of Azo.  He took a few doses of an old prescription of amoxicillin, does not know how old these are.  He also started having a draining left painful red eye yesterday.  States that he saw a white spot like a stye.  Denies wearing contact lenses, no eye trauma.  No photophobia.  Vision is largely unchanged per patient.  Does not have insurance.  No current facility-administered medications for this encounter.  Current Outpatient Medications:    Escitalopram Oxalate (LEXAPRO PO), Take by mouth., Disp: , Rfl:    No Known Allergies  Past Medical History:  Diagnosis Date   Chronic kidney disease    patient denies   H/O hiatal hernia    History of kidney stones    Renal stones    SBO (small bowel obstruction) (HCC) 03/2016     Past Surgical History:  Procedure Laterality Date   1984  KNEE SURGERY     2007 OR 2008  KIDNEY STONE REMOVED     APPENDECTOMY  2003   DONE AT TIME OF COLON RESECTION   CHOLECYSTECTOMY  2010   AT SAME TIME OF HERNIA REPAIR   COLON SURGERY  2003   COLON RESECTION FOR DIVERTICULITIS / COLOSTOMY   COLOSTOMY REVERSAL  2003   CYSTOSCOPY WITH URETEROSCOPY AND STENT PLACEMENT Right 05/03/2013   Procedure: CYSTOSCOPY, RIGHT RETROGRADE PYELOGRAMRIGHT URETEROSCOPY, STONE EXTRACTION  AND STENT PLACEMENT;  Surgeon: Antony Haste, MD;  Location: [facility];  Service: Urology;  Laterality: Right;   HERNIA REPAIR  2010    Family History  Problem Relation Age of Onset   Diabetes Mother    Cancer Mother        breast cancer    Stroke Maternal Aunt    Heart disease Paternal Grandfather     Social History   Tobacco Use   Smoking status: Never Smoker   Smokeless tobacco: Never Used  Substance Use Topics   Alcohol use: No    Drug use: No    ROS   Objective:   Vitals: BP 127/83 (BP Location: Right Arm) Comment (BP Location): large cuff   Pulse (!) 108    Temp 98.9 F (37.2 C) (Oral)    Resp (!) 24    SpO2 96%     Visual Acuity  Right Eye Distance: 20/40 Left Eye Distance: 20/50 Bilateral Distance: 20/40  Right Eye Near:   Left Eye Near:    Bilateral Near:      Physical Exam Constitutional:      General: He is not in acute distress.    Appearance: Normal appearance. He is well-developed. He is obese. He is not ill-appearing, toxic-appearing or diaphoretic.  HENT:     Head: Normocephalic and atraumatic.     Right Ear: External ear normal.     Left Ear: External ear normal.     Nose: Nose normal.     Mouth/Throat:     Mouth: Mucous membranes are moist.     Pharynx: Oropharynx is clear.  Eyes:     General: Lids are everted, no foreign bodies appreciated. No scleral icterus.       Right eye: No discharge.        Left eye: Discharge and hordeolum (inner left lower eyelid) present.No foreign body.  Extraocular Movements: Extraocular movements intact.     Conjunctiva/sclera:     Right eye: Right conjunctiva is not injected. No chemosis, exudate or hemorrhage.    Left eye: Left conjunctiva is injected. Exudate (upper eyelid/lashes) present. No chemosis or hemorrhage.    Pupils: Pupils are equal, round, and reactive to light.  Cardiovascular:     Rate and Rhythm: Normal rate and regular rhythm.     Heart sounds: Normal heart sounds. No murmur heard.  No friction rub. No gallop.   Pulmonary:     Effort: Pulmonary effort is normal. No respiratory distress.     Breath sounds: Normal breath sounds. No stridor. No wheezing, rhonchi or rales.  Abdominal:     General: Bowel sounds are normal. There is no distension.     Palpations: Abdomen is soft. There is no mass.     Tenderness: There is no abdominal tenderness. There is no right CVA tenderness, left CVA tenderness, guarding or rebound.    Neurological:     Mental Status: He is alert and oriented to person, place, and time.  Psychiatric:        Mood and Affect: Mood normal.        Behavior: Behavior normal.        Thought Content: Thought content normal.        Judgment: Judgment normal.     Results for orders placed or performed during the hospital encounter of 01/30/20 (from the past 24 hour(s))  POCT urinalysis dip (device)     Status: Abnormal   Collection Time: 01/30/20  7:14 PM  Result Value Ref Range   Glucose, UA 100 (A) NEGATIVE mg/dL   Bilirubin Urine NEGATIVE NEGATIVE   Ketones, ur TRACE (A) NEGATIVE mg/dL   Specific Gravity, Urine 1.025 1.005 - 1.030   Hgb urine dipstick NEGATIVE NEGATIVE   pH 5.5 5.0 - 8.0   Protein, ur NEGATIVE NEGATIVE mg/dL   Urobilinogen, UA 1.0 0.0 - 1.0 mg/dL   Nitrite POSITIVE (A) NEGATIVE   Leukocytes,Ua NEGATIVE NEGATIVE    Assessment and Plan :   PDMP not reviewed this encounter.  1. Acute cystitis without hematuria   2. Acute bacterial conjunctivitis of left eye   3. Acute left eye pain   4. Dysuria   5. Urinary frequency     We will use cefdinir for double coverage of a UTI and his left eye infection.  Urine culture pending.  Emphasized need to hydrate aggressively.  Recommended a rapamycin ointment as well.  Visual acuity reassuring.  No photophobia.  No red ring.  Recommended follow-up closely with Dr. Dione Booze as they are on-call through [facility]. Counseled patient on potential for adverse effects with medications prescribed/recommended today, ER and return-to-clinic precautions discussed, patient verbalized understanding.    Wallis Bamberg, PA-C 01/30/20 2015

## 2020-01-30 NOTE — ED Triage Notes (Signed)
Left eye is red and lymph nodes to left side of face is swollen and painful.  Left eye started hurting last night.  Patient does not report an injury.  Patient does not wear contacts.  Patient reports slight blurriness since eye started watering.    Patient say he thinks he has had uti since Thursday.  Patient has been having burning with urination.  Patient taking azo.  Patient taking old script of amoxicillin.  Started taking these pills on friday

## 2020-02-01 LAB — URINE CULTURE: Culture: NO GROWTH

## 2021-04-29 ENCOUNTER — Encounter (HOSPITAL_BASED_OUTPATIENT_CLINIC_OR_DEPARTMENT_OTHER): Payer: Self-pay | Admitting: Emergency Medicine

## 2021-04-29 ENCOUNTER — Emergency Department (HOSPITAL_BASED_OUTPATIENT_CLINIC_OR_DEPARTMENT_OTHER)
Admission: EM | Admit: 2021-04-29 | Discharge: 2021-04-29 | Disposition: A | Discharge status: Home / Self Care | Payer: Self-pay | Attending: Emergency Medicine | Admitting: Emergency Medicine

## 2021-04-29 ENCOUNTER — Other Ambulatory Visit: Payer: Self-pay

## 2021-04-29 ENCOUNTER — Emergency Department (HOSPITAL_BASED_OUTPATIENT_CLINIC_OR_DEPARTMENT_OTHER): Payer: Self-pay

## 2021-04-29 DIAGNOSIS — R45851 Suicidal ideations: Secondary | ICD-10-CM | POA: Insufficient documentation

## 2021-04-29 DIAGNOSIS — R4182 Altered mental status, unspecified: Secondary | ICD-10-CM | POA: Insufficient documentation

## 2021-04-29 DIAGNOSIS — F419 Anxiety disorder, unspecified: Secondary | ICD-10-CM | POA: Insufficient documentation

## 2021-04-29 DIAGNOSIS — Z7952 Long term (current) use of systemic steroids: Secondary | ICD-10-CM | POA: Insufficient documentation

## 2021-04-29 DIAGNOSIS — F32A Depression, unspecified: Secondary | ICD-10-CM | POA: Insufficient documentation

## 2021-04-29 LAB — CBC
HCT: 47.2 % (ref 39.0–52.0)
Hemoglobin: 15.6 g/dL (ref 13.0–17.0)
MCH: 26.6 pg (ref 26.0–34.0)
MCHC: 33.1 g/dL (ref 30.0–36.0)
MCV: 80.5 fL (ref 80.0–100.0)
Platelets: 331 10*3/uL (ref 150–400)
RBC: 5.86 MIL/uL — ABNORMAL HIGH (ref 4.22–5.81)
RDW: 13.9 % (ref 11.5–15.5)
WBC: 7.9 10*3/uL (ref 4.0–10.5)
nRBC: 0 % (ref 0.0–0.2)

## 2021-04-29 LAB — COMPREHENSIVE METABOLIC PANEL
ALT: 36 U/L (ref 0–44)
AST: 26 U/L (ref 15–41)
Albumin: 4.2 g/dL (ref 3.5–5.0)
Alkaline Phosphatase: 88 U/L (ref 38–126)
Anion gap: 11 (ref 5–15)
BUN: 9 mg/dL (ref 6–20)
CO2: 23 mmol/L (ref 22–32)
Calcium: 9.6 mg/dL (ref 8.9–10.3)
Chloride: 104 mmol/L (ref 98–111)
Creatinine, Ser: 0.61 mg/dL (ref 0.61–1.24)
GFR, Estimated: >60 mL/min (ref >60)
Glucose, Bld: 128 mg/dL — ABNORMAL HIGH (ref 70–99)
Potassium: 3.5 mmol/L (ref 3.5–5.1)
Sodium: 138 mmol/L (ref 135–145)
Total Bilirubin: 0.8 mg/dL (ref 0.3–1.2)
Total Protein: 8.1 g/dL (ref 6.5–8.1)

## 2021-04-29 LAB — RAPID URINE DRUG SCREEN, HOSP PERFORMED
Amphetamines: NOT DETECTED
Barbiturates: NOT DETECTED
Benzodiazepines: POSITIVE — AB
Cocaine: NOT DETECTED
Opiates: NOT DETECTED
Tetrahydrocannabinol: NOT DETECTED

## 2021-04-29 LAB — SALICYLATE LEVEL: Salicylate Lvl: <7 mg/dL — ABNORMAL LOW (ref 7.0–30.0)

## 2021-04-29 LAB — ETHANOL: Alcohol, Ethyl (B): <10 mg/dL (ref <10)

## 2021-04-29 LAB — ACETAMINOPHEN LEVEL: Acetaminophen (Tylenol), Serum: <10 ug/mL — ABNORMAL LOW (ref 10–30)

## 2021-04-29 MED ORDER — LORAZEPAM 1 MG PO TABS
1.0000 mg | ORAL_TABLET | Freq: Once | ORAL | Status: AC
  Administered 2021-04-29: 1 mg via ORAL
  Filled 2021-04-29: qty 1

## 2021-04-29 MED ORDER — LORAZEPAM 1 MG PO TABS
1.0000 mg | ORAL_TABLET | Freq: Three times a day (TID) | ORAL | 0 refills | Status: DC | PRN
Start: 2021-04-29 — End: 2022-08-13

## 2021-04-29 NOTE — ED Provider Notes (Signed)
[facility] EMERGENCY DEPT Provider Note   CSN: [id number] Arrival date & time: 04/29/21  1354     History Chief Complaint  Patient presents with   Depression    Tyrone Fernandez is a 55 y.o. male.  He is here with complaint of almost 2 weeks of increased labile mood, feeling anxious, easy to cry.  This all occurred after he contracted COVID.  He did not receive any medications for his COVID.  He is not suicidal with a plan but he does admit he has had some thoughts at times.  Denies any homicidal ideation hallucinations substance abuse.  He does not have a PCP and does not have insurance.  The history is provided by the patient and the spouse.  Mental Health Problem Presenting symptoms: depression and suicidal thoughts   Presenting symptoms: no delusions, no hallucinations, no homicidal ideas and no paranoid behavior   Degree of incapacity (severity):  Unable to specify Onset quality:  Gradual Duration:  1 week Timing:  Constant Progression:  Unchanged Chronicity:  New Context: stressful life event   Treatment compliance:  Untreated Relieved by:  Nothing Worsened by:  Nothing Ineffective treatments:  None tried Associated symptoms: anxiety   Associated symptoms: no abdominal pain, no chest pain and no headaches   Risk factors: no hx of mental illness and no neurological disease       Past Medical History:  Diagnosis Date   Chronic kidney disease    patient denies   H/O hiatal hernia    History of kidney stones    Renal stones    SBO (small bowel obstruction) ([place]) 03/2016    Patient Active Problem List   Diagnosis Date Noted   Hernia of anterior abdominal wall    SBO (small bowel obstruction) ([place]) 04/08/2016   History of bronchitis 09/27/2014   Cough 09/27/2014   Obesity 09/27/2014    Past Surgical History:  Procedure Laterality Date   1984  KNEE SURGERY     2007 OR 2008  KIDNEY STONE REMOVED     APPENDECTOMY  2003   DONE AT TIME OF COLON  RESECTION   CHOLECYSTECTOMY  2010   AT SAME TIME OF HERNIA REPAIR   COLON SURGERY  2003   COLON RESECTION FOR DIVERTICULITIS / COLOSTOMY   COLOSTOMY REVERSAL  2003   CYSTOSCOPY WITH URETEROSCOPY AND STENT PLACEMENT Right 05/03/2013   Procedure: CYSTOSCOPY, RIGHT RETROGRADE PYELOGRAMRIGHT URETEROSCOPY, STONE EXTRACTION  AND STENT PLACEMENT;  Surgeon: Fredricka Bonine, MD;  Location: [facility];  Service: Urology;  Laterality: Right;   HERNIA REPAIR  2010       Family History  Problem Relation Age of Onset   Diabetes Mother    Cancer Mother        breast cancer    Stroke Maternal Aunt    Heart disease Paternal Grandfather     Social History   Tobacco Use   Smoking status: Never   Smokeless tobacco: Never  Substance Use Topics   Alcohol use: No   Drug use: No    Home Medications Prior to Admission medications   Medication Sig Start Date End Date Taking? Authorizing Provider  cefdinir (OMNICEF) 300 MG capsule Take 1 capsule (300 mg total) by mouth 2 (two) times daily. 01/30/20   Jaynee Eagles, PA-C  erythromycin ophthalmic ointment Place a 1/2 inch ribbon of ointment into the left lower eyelid every 3 hours. 01/30/20   Jaynee Eagles, PA-C  Escitalopram Oxalate (LEXAPRO PO) Take by  mouth.    [provider]  naproxen (NAPROSYN) 500 MG tablet Take 1 tablet (500 mg total) by mouth 2 (two) times daily with a meal. 01/30/20   Wallis Bamberg, PA-C  albuterol (PROVENTIL HFA;VENTOLIN HFA) 108 (90 BASE) MCG/ACT inhaler Inhale 1-2 puffs into the lungs every 6 (six) hours as needed for wheezing or shortness of breath. Patient not taking: Reported on 04/08/2016 09/03/14 01/30/20  Joycie Peek, PA-C  fluticasone [facility]) 50 MCG/ACT nasal spray Place 2 sprays into both nostrils daily. Patient not taking: Reported on 04/08/2016 08/26/14 01/30/20  Everlene Farrier, PA-C  omeprazole (PRILOSEC) 10 MG capsule Take 1 capsule (10 mg total) by mouth daily. 11/07/17 01/30/20  Cuthriell, Delorise Royals, PA-C     Allergies    Patient has no known allergies.  Review of Systems   Review of Systems  Constitutional:  Negative for fever.  HENT:  Negative for sore throat.   Eyes:  Negative for visual disturbance.  Respiratory:  Negative for shortness of breath.   Cardiovascular:  Negative for chest pain.  Gastrointestinal:  Negative for abdominal pain.  Genitourinary:  Negative for dysuria.  Musculoskeletal:  Negative for neck pain.  Skin:  Negative for rash.  Neurological:  Negative for headaches.  Psychiatric/Behavioral:  Positive for suicidal ideas. Negative for hallucinations, homicidal ideas and paranoia. The patient is nervous/anxious.    Physical Exam Updated Vital Signs BP 132/87 (BP Location: Right Arm)   Pulse 95   Temp 98.8 F (37.1 C) (Oral)   Resp 18   Ht 5\' 10"  (1.778 m)   Wt 131.5 kg   SpO2 99%   BMI 41.61 kg/m   Physical Exam Vitals and nursing note reviewed.  Constitutional:      Appearance: He is well-developed.  HENT:     Head: Normocephalic and atraumatic.  Eyes:     Conjunctiva/sclera: Conjunctivae normal.  Cardiovascular:     Rate and Rhythm: Normal rate and regular rhythm.     Heart sounds: No murmur heard. Pulmonary:     Effort: Pulmonary effort is normal. No respiratory distress.     Breath sounds: Normal breath sounds.  Abdominal:     Palpations: Abdomen is soft.     Tenderness: There is no abdominal tenderness.  Musculoskeletal:     Cervical back: Neck supple.  Skin:    General: Skin is warm and dry.  Neurological:     General: No focal deficit present.     Mental Status: He is alert.  Psychiatric:        Attention and Perception: Attention normal.        Mood and Affect: Mood is anxious.        Speech: Speech normal.        Behavior: Behavior normal.        Thought Content: Thought content normal.    ED Results / Procedures / Treatments   Labs (all labs ordered are listed, but only abnormal results are displayed) Labs Reviewed   COMPREHENSIVE METABOLIC PANEL - Abnormal; Notable for the following components:      Result Value   Glucose, Bld 128 (*)    All other components within normal limits  SALICYLATE LEVEL - Abnormal; Notable for the following components:   Salicylate Lvl <7.0 (*)    All other components within normal limits  ACETAMINOPHEN LEVEL - Abnormal; Notable for the following components:   Acetaminophen (Tylenol), Serum <10 (*)    All other components within normal limits  CBC - Abnormal;  Notable for the following components:   RBC 5.86 (*)    All other components within normal limits  RAPID URINE DRUG SCREEN, HOSP PERFORMED - Abnormal; Notable for the following components:   Benzodiazepines POSITIVE (*)    All other components within normal limits  ETHANOL    EKG None  Radiology CT Head Wo Contrast  Result Date: 04/29/2021 CLINICAL DATA:  Mental status change, unknown cause EXAM: CT HEAD WITHOUT CONTRAST TECHNIQUE: Contiguous axial images were obtained from the base of the skull through the vertex without intravenous contrast. COMPARISON:  None. FINDINGS: Brain: No evidence of large-territorial acute infarction. No parenchymal hemorrhage. No mass lesion. No extra-axial collection. No mass effect or midline shift. No hydrocephalus. Basilar cisterns are patent. Vascular: No hyperdense vessel. Skull: No acute fracture or focal lesion. Sinuses/Orbits: Paranasal sinuses and mastoid air cells are clear. The orbits are unremarkable. Other: None. IMPRESSION: No acute intracranial abnormality. Electronically Signed   By: Tish Frederickson M.D.   On: 04/29/2021 18:11    Procedures Procedures   Medications Ordered in ED Medications  LORazepam (ATIVAN) tablet 1 mg (has no administration in time range)    ED Course  I have reviewed the triage vital signs and the nursing notes.  Pertinent labs & imaging results that were available during my care of the patient were reviewed by me and considered in my  medical decision making (see chart for details).  Clinical Course as of 04/30/21 0948  Mon Apr 29, 2021  2033 Long discussion with patient and his wife regarding TTS evaluation versus outpatient follow-up at [facility].  They would rather be discharged and follow-up at the urgent care.  He does not have a primary care doctor or insurance.  Will cover with short prescription for some Ativan although explained this is not a good long-term solution.  Recommended return to the emergency department if any acute worsening of his condition. [MB]    Clinical Course User Index [MB] Terrilee Files, MD   MDM Rules/Calculators/A&P                          This patient complains of increased anxiety and labile mood after COVID infection; this involves an extensive number of treatment Options and is a complaint that carries with it a high risk of complications and Morbidity. The differential includes metabolic derangement, post-COVID syndrome, hypoxia, depression, anxiety  I ordered, reviewed and interpreted labs, which included CBC with normal white count normal hemoglobin, chemistries normal other than mildly elevated glucose, aspirin Tylenol negative, alcohol negative I ordered medication oral Ativan I ordered imaging studies which included head CT and I independently    visualized and interpreted imaging which showed no acute findings Additional history obtained from patient and spouse Previous records obtained and reviewed in epic, no recent admissions  After the interventions stated above, I reevaluated the patient and found patient to be hemodynamically stable.  No acute toxic metabolic findings on work-up.  Offered TTS evaluation and patient declined.  He denies any acute thoughts of suicidality and wife also feels he is at low risk for suicide.  He is agreeable to outpatient follow-up with behavioral health urgent care.  Also given a list of resources and a short prescription for Ativan for  symptomatic treatment of his anxiety symptoms.  Patient was counseled that this is habit-forming and not a good long-term solution   Final Clinical Impression(s) / ED Diagnoses Final diagnoses:  Anxiety  Rx / DC Orders ED Discharge Orders          Ordered    LORazepam (ATIVAN) 1 MG tablet  3 times daily PRN        04/29/21 2035             Hayden Rasmussen, MD 04/30/21 [phone]

## 2021-04-29 NOTE — ED Provider Notes (Signed)
Emergency Medicine Provider Triage Evaluation Note  AIDDEN Fernandez , a 55 y.o. male  was evaluated in triage.  Pt complains of anxiety and depression.  He was diagnosed with COVID on 04/18/2021 and has had increasing anxiety, nervousness, shaking and uncontrollable crying since then.  States that it is not related to COVID and his symptoms have since resolved.  Significant other at bedside states that symptoms became worse on Friday, 04/26/2021 and have been "uncontrollable" since then.  Does not take any medication every day.  Significant other also notes that he is taking care of his father with dementia and she thinks this may be a stressor however the patient does not perceive this to be a stressor.  He is having intermittent thoughts of suicide without plan.  Denies HI, AVH, EtOH or drug use. Review of Systems  Positive: Suicidal thoughts, anxiety Negative: HI, AVH  Physical Exam  BP 132/87 (BP Location: Right Arm)   Pulse 95   Temp 98.8 F (37.1 C) (Oral)   Resp 18   Ht 5\' 10"  (1.778 m)   Wt 131.5 kg   SpO2 99%   BMI 41.61 kg/m  Gen:   Awake, alert and oriented, crying on exam Resp:  Normal effort  MSK:   Moves extremities without difficulty  Other:    Medical Decision Making  Medically screening exam initiated at 5:46 PM.  Appropriate orders placed.  was informed that the remainder of the evaluation will be completed by another provider, this initial triage assessment does not replace that evaluation, and the importance of remaining in the ED until their evaluation is complete.     Heriberto Antigua, PA-C 04/29/21 1749    05/01/21, MD 04/30/21 [phone]

## 2021-04-29 NOTE — ED Triage Notes (Addendum)
Diagnosed with COVID 10/20. Reports depression, anxiety, crying x 1 week. No history of depression. States he has thought about dying but denies SI

## 2021-08-19 ENCOUNTER — Observation Stay (HOSPITAL_BASED_OUTPATIENT_CLINIC_OR_DEPARTMENT_OTHER): Payer: Self-pay | Admitting: Certified Registered Nurse Anesthetist

## 2021-08-19 ENCOUNTER — Encounter (HOSPITAL_COMMUNITY): Admission: EM | Disposition: A | Discharge status: Home / Self Care | Payer: Self-pay | Source: Home / Self Care

## 2021-08-19 ENCOUNTER — Inpatient Hospital Stay (HOSPITAL_COMMUNITY)
Admission: EM | Admit: 2021-08-19 | Discharge: 2021-08-23 | DRG: 336 | Disposition: A | Discharge status: Home / Self Care | Payer: Self-pay | Attending: Surgery | Admitting: Surgery

## 2021-08-19 ENCOUNTER — Other Ambulatory Visit: Payer: Self-pay

## 2021-08-19 ENCOUNTER — Encounter (HOSPITAL_COMMUNITY): Payer: Self-pay

## 2021-08-19 ENCOUNTER — Emergency Department (HOSPITAL_COMMUNITY): Payer: Self-pay

## 2021-08-19 ENCOUNTER — Observation Stay (HOSPITAL_COMMUNITY): Payer: Self-pay | Admitting: Certified Registered Nurse Anesthetist

## 2021-08-19 DIAGNOSIS — K430 Incisional hernia with obstruction, without gangrene: Secondary | ICD-10-CM

## 2021-08-19 DIAGNOSIS — Z87442 Personal history of urinary calculi: Secondary | ICD-10-CM

## 2021-08-19 DIAGNOSIS — Z823 Family history of stroke: Secondary | ICD-10-CM

## 2021-08-19 DIAGNOSIS — Z803 Family history of malignant neoplasm of breast: Secondary | ICD-10-CM

## 2021-08-19 DIAGNOSIS — K449 Diaphragmatic hernia without obstruction or gangrene: Secondary | ICD-10-CM

## 2021-08-19 DIAGNOSIS — K436 Other and unspecified ventral hernia with obstruction, without gangrene: Secondary | ICD-10-CM | POA: Diagnosis present

## 2021-08-19 DIAGNOSIS — K5651 Intestinal adhesions [bands], with partial obstruction: Secondary | ICD-10-CM | POA: Diagnosis present

## 2021-08-19 DIAGNOSIS — Z833 Family history of diabetes mellitus: Secondary | ICD-10-CM

## 2021-08-19 DIAGNOSIS — Z9049 Acquired absence of other specified parts of digestive tract: Secondary | ICD-10-CM

## 2021-08-19 DIAGNOSIS — Z8249 Family history of ischemic heart disease and other diseases of the circulatory system: Secondary | ICD-10-CM

## 2021-08-19 DIAGNOSIS — R112 Nausea with vomiting, unspecified: Secondary | ICD-10-CM

## 2021-08-19 DIAGNOSIS — Z8719 Personal history of other diseases of the digestive system: Secondary | ICD-10-CM

## 2021-08-19 DIAGNOSIS — R109 Unspecified abdominal pain: Secondary | ICD-10-CM

## 2021-08-19 DIAGNOSIS — Z6841 Body Mass Index (BMI) 40.0 and over, adult: Secondary | ICD-10-CM

## 2021-08-19 DIAGNOSIS — Z20822 Contact with and (suspected) exposure to covid-19: Secondary | ICD-10-CM | POA: Diagnosis present

## 2021-08-19 DIAGNOSIS — K660 Peritoneal adhesions (postprocedural) (postinfection): Secondary | ICD-10-CM

## 2021-08-19 HISTORY — PX: VENTRAL HERNIA REPAIR: SHX424

## 2021-08-19 LAB — RESP PANEL BY RT-PCR (FLU A&B, COVID) ARPGX2
Influenza A by PCR: NEGATIVE
Influenza B by PCR: NEGATIVE
SARS Coronavirus 2 by RT PCR: NEGATIVE

## 2021-08-19 LAB — COMPREHENSIVE METABOLIC PANEL
ALT: 27 U/L (ref 0–44)
AST: 28 U/L (ref 15–41)
Albumin: 3.8 g/dL (ref 3.5–5.0)
Alkaline Phosphatase: 81 U/L (ref 38–126)
Anion gap: 11 (ref 5–15)
BUN: 15 mg/dL (ref 6–20)
CO2: 23 mmol/L (ref 22–32)
Calcium: 9.5 mg/dL (ref 8.9–10.3)
Chloride: 102 mmol/L (ref 98–111)
Creatinine, Ser: 0.89 mg/dL (ref 0.61–1.24)
GFR, Estimated: >60 mL/min (ref >60)
Glucose, Bld: 138 mg/dL — ABNORMAL HIGH (ref 70–99)
Potassium: 3.7 mmol/L (ref 3.5–5.1)
Sodium: 136 mmol/L (ref 135–145)
Total Bilirubin: 0.4 mg/dL (ref 0.3–1.2)
Total Protein: 8.3 g/dL — ABNORMAL HIGH (ref 6.5–8.1)

## 2021-08-19 LAB — URINALYSIS, ROUTINE W REFLEX MICROSCOPIC
Bilirubin Urine: NEGATIVE
Glucose, UA: NEGATIVE mg/dL
Hgb urine dipstick: NEGATIVE
Ketones, ur: NEGATIVE mg/dL
Leukocytes,Ua: NEGATIVE
Nitrite: NEGATIVE
Protein, ur: NEGATIVE mg/dL
Specific Gravity, Urine: 1.017 (ref 1.005–1.030)
pH: 6 (ref 5.0–8.0)

## 2021-08-19 LAB — CBC
HCT: 48.7 % (ref 39.0–52.0)
Hemoglobin: 15.8 g/dL (ref 13.0–17.0)
MCH: 27.3 pg (ref 26.0–34.0)
MCHC: 32.4 g/dL (ref 30.0–36.0)
MCV: 84.1 fL (ref 80.0–100.0)
Platelets: 366 10*3/uL (ref 150–400)
RBC: 5.79 MIL/uL (ref 4.22–5.81)
RDW: 13.7 % (ref 11.5–15.5)
WBC: 11.1 10*3/uL — ABNORMAL HIGH (ref 4.0–10.5)
nRBC: 0 % (ref 0.0–0.2)

## 2021-08-19 LAB — LIPASE, BLOOD: Lipase: 29 U/L (ref 11–51)

## 2021-08-19 LAB — HIV ANTIBODY (ROUTINE TESTING W REFLEX): HIV Screen 4th Generation wRfx: NONREACTIVE

## 2021-08-19 SURGERY — REPAIR, HERNIA, VENTRAL
Anesthesia: General | Site: Abdomen

## 2021-08-19 MED ORDER — LORAZEPAM 1 MG PO TABS: 1.0000 mg | ORAL_TABLET | Freq: Three times a day (TID) | ORAL | Status: DC | PRN

## 2021-08-19 MED ORDER — DIPHENHYDRAMINE HCL 25 MG PO CAPS
25.0000 mg | ORAL_CAPSULE | Freq: Four times a day (QID) | ORAL | Status: DC | PRN
  Administered 2021-08-22: 25 mg via ORAL
  Filled 2021-08-19: qty 1

## 2021-08-19 MED ORDER — LIDOCAINE 2% (20 MG/ML) 5 ML SYRINGE
INTRAMUSCULAR | Status: DC | PRN
  Administered 2021-08-19: 60 mg via INTRAVENOUS

## 2021-08-19 MED ORDER — ONDANSETRON 4 MG PO TBDP: 4.0000 mg | ORAL_TABLET | Freq: Four times a day (QID) | ORAL | Status: DC | PRN

## 2021-08-19 MED ORDER — BUPIVACAINE HCL (PF) 0.25 % IJ SOLN
INTRAMUSCULAR | Status: AC
  Filled 2021-08-19: qty 30

## 2021-08-19 MED ORDER — MIDAZOLAM HCL 2 MG/2ML IJ SOLN
INTRAMUSCULAR | Status: DC | PRN
  Administered 2021-08-19: 2 mg via INTRAVENOUS

## 2021-08-19 MED ORDER — SUCCINYLCHOLINE CHLORIDE 200 MG/10ML IV SOSY
PREFILLED_SYRINGE | INTRAVENOUS | Status: DC | PRN
  Administered 2021-08-19: 140 mg via INTRAVENOUS

## 2021-08-19 MED ORDER — PHENYLEPHRINE 40 MCG/ML (10ML) SYRINGE FOR IV PUSH (FOR BLOOD PRESSURE SUPPORT)
PREFILLED_SYRINGE | INTRAVENOUS | Status: DC | PRN
  Administered 2021-08-19: 160 ug via INTRAVENOUS

## 2021-08-19 MED ORDER — CHLORHEXIDINE GLUCONATE 0.12 % MT SOLN: 15.0000 mL | Freq: Once | OROMUCOSAL | Status: AC

## 2021-08-19 MED ORDER — SIMETHICONE 80 MG PO CHEW: 40.0000 mg | CHEWABLE_TABLET | Freq: Four times a day (QID) | ORAL | Status: DC | PRN

## 2021-08-19 MED ORDER — DIPHENHYDRAMINE HCL 50 MG/ML IJ SOLN: 25.0000 mg | Freq: Four times a day (QID) | INTRAMUSCULAR | Status: DC | PRN

## 2021-08-19 MED ORDER — CELECOXIB 200 MG PO CAPS: 200.0000 mg | ORAL_CAPSULE | Freq: Once | ORAL | Status: DC

## 2021-08-19 MED ORDER — SUGAMMADEX SODIUM 200 MG/2ML IV SOLN
INTRAVENOUS | Status: DC | PRN
  Administered 2021-08-19: 200 mg via INTRAVENOUS

## 2021-08-19 MED ORDER — KETOROLAC TROMETHAMINE 30 MG/ML IJ SOLN
INTRAMUSCULAR | Status: DC | PRN
  Administered 2021-08-19: 30 mg via INTRAVENOUS

## 2021-08-19 MED ORDER — ACETAMINOPHEN 325 MG PO TABS: 650.0000 mg | ORAL_TABLET | Freq: Four times a day (QID) | ORAL | Status: DC | PRN

## 2021-08-19 MED ORDER — ACETAMINOPHEN 500 MG PO TABS: 1000.0000 mg | ORAL_TABLET | Freq: Once | ORAL | Status: DC

## 2021-08-19 MED ORDER — ONDANSETRON HCL 4 MG/2ML IJ SOLN
4.0000 mg | Freq: Four times a day (QID) | INTRAMUSCULAR | Status: DC | PRN
  Administered 2021-08-19 – 2021-08-23 (×8): 4 mg via INTRAVENOUS
  Filled 2021-08-19 (×8): qty 2

## 2021-08-19 MED ORDER — DEXAMETHASONE SODIUM PHOSPHATE 10 MG/ML IJ SOLN
INTRAMUSCULAR | Status: DC | PRN
  Administered 2021-08-19: 10 mg via INTRAVENOUS

## 2021-08-19 MED ORDER — LACTATED RINGERS IV BOLUS
1000.0000 mL | Freq: Once | INTRAVENOUS | Status: AC
  Administered 2021-08-19: 1000 mL via INTRAVENOUS

## 2021-08-19 MED ORDER — ENOXAPARIN SODIUM 40 MG/0.4ML IJ SOSY
40.0000 mg | PREFILLED_SYRINGE | INTRAMUSCULAR | Status: DC
  Administered 2021-08-20 – 2021-08-23 (×4): 40 mg via SUBCUTANEOUS
  Filled 2021-08-19 (×4): qty 0.4

## 2021-08-19 MED ORDER — HYDROMORPHONE HCL 1 MG/ML IJ SOLN
1.0000 mg | INTRAMUSCULAR | Status: DC | PRN
  Administered 2021-08-19 – 2021-08-20 (×4): 1 mg via INTRAVENOUS
  Filled 2021-08-19 (×4): qty 1

## 2021-08-19 MED ORDER — ORAL CARE MOUTH RINSE: 15.0000 mL | Freq: Once | OROMUCOSAL | Status: AC

## 2021-08-19 MED ORDER — IOHEXOL 300 MG/ML  SOLN
100.0000 mL | Freq: Once | INTRAMUSCULAR | Status: AC | PRN
  Administered 2021-08-19: 100 mL via INTRAVENOUS

## 2021-08-19 MED ORDER — CALCIUM CARBONATE ANTACID 500 MG PO CHEW: 4.0000 | CHEWABLE_TABLET | Freq: Every day | ORAL | Status: DC | PRN

## 2021-08-19 MED ORDER — ENOXAPARIN SODIUM 40 MG/0.4ML IJ SOSY: 40.0000 mg | PREFILLED_SYRINGE | INTRAMUSCULAR | Status: DC

## 2021-08-19 MED ORDER — FENTANYL CITRATE (PF) 250 MCG/5ML IJ SOLN
INTRAMUSCULAR | Status: DC | PRN
  Administered 2021-08-19: 100 ug via INTRAVENOUS
  Administered 2021-08-19 (×2): 50 ug via INTRAVENOUS

## 2021-08-19 MED ORDER — ACETAMINOPHEN 10 MG/ML IV SOLN
INTRAVENOUS | Status: AC
  Filled 2021-08-19: qty 100

## 2021-08-19 MED ORDER — MIDAZOLAM HCL 2 MG/2ML IJ SOLN
INTRAMUSCULAR | Status: AC
  Filled 2021-08-19: qty 2

## 2021-08-19 MED ORDER — HYDROMORPHONE HCL 1 MG/ML IJ SOLN
0.5000 mg | Freq: Once | INTRAMUSCULAR | Status: AC
  Administered 2021-08-19: 0.5 mg via INTRAVENOUS
  Filled 2021-08-19: qty 1

## 2021-08-19 MED ORDER — OXYCODONE HCL 5 MG PO TABS: 5.0000 mg | ORAL_TABLET | ORAL | Status: DC | PRN

## 2021-08-19 MED ORDER — FENTANYL CITRATE (PF) 100 MCG/2ML IJ SOLN
INTRAMUSCULAR | Status: AC
  Filled 2021-08-19: qty 2

## 2021-08-19 MED ORDER — METHOCARBAMOL 500 MG PO TABS: 500.0000 mg | ORAL_TABLET | Freq: Four times a day (QID) | ORAL | Status: DC | PRN

## 2021-08-19 MED ORDER — PANTOPRAZOLE SODIUM 40 MG IV SOLR
40.0000 mg | Freq: Every day | INTRAVENOUS | Status: DC
  Administered 2021-08-19 – 2021-08-20 (×2): 40 mg via INTRAVENOUS
  Filled 2021-08-19 (×2): qty 10

## 2021-08-19 MED ORDER — ACETAMINOPHEN 10 MG/ML IV SOLN
INTRAVENOUS | Status: DC | PRN
  Administered 2021-08-19: 1000 mg via INTRAVENOUS

## 2021-08-19 MED ORDER — PROPOFOL 10 MG/ML IV BOLUS
INTRAVENOUS | Status: AC
  Filled 2021-08-19: qty 20

## 2021-08-19 MED ORDER — PROMETHAZINE HCL 25 MG/ML IJ SOLN: 6.2500 mg | INTRAMUSCULAR | Status: DC | PRN

## 2021-08-19 MED ORDER — FENTANYL CITRATE (PF) 100 MCG/2ML IJ SOLN
25.0000 ug | INTRAMUSCULAR | Status: DC | PRN
  Administered 2021-08-19 (×3): 25 ug via INTRAVENOUS

## 2021-08-19 MED ORDER — HYDRALAZINE HCL 20 MG/ML IJ SOLN: 10.0000 mg | INTRAMUSCULAR | Status: DC | PRN

## 2021-08-19 MED ORDER — OXYCODONE HCL 5 MG PO TABS: 5.0000 mg | ORAL_TABLET | Freq: Once | ORAL | Status: DC | PRN

## 2021-08-19 MED ORDER — ROCURONIUM BROMIDE 10 MG/ML (PF) SYRINGE
PREFILLED_SYRINGE | INTRAVENOUS | Status: DC | PRN
  Administered 2021-08-19: 70 mg via INTRAVENOUS
  Administered 2021-08-19: 10 mg via INTRAVENOUS
  Administered 2021-08-19: 20 mg via INTRAVENOUS

## 2021-08-19 MED ORDER — FENTANYL CITRATE (PF) 250 MCG/5ML IJ SOLN
INTRAMUSCULAR | Status: AC
  Filled 2021-08-19: qty 5

## 2021-08-19 MED ORDER — OXYCODONE HCL 5 MG/5ML PO SOLN: 5.0000 mg | Freq: Once | ORAL | Status: DC | PRN

## 2021-08-19 MED ORDER — PROPOFOL 10 MG/ML IV BOLUS
INTRAVENOUS | Status: DC | PRN
  Administered 2021-08-19: 200 mg via INTRAVENOUS

## 2021-08-19 MED ORDER — CHLORHEXIDINE GLUCONATE 0.12 % MT SOLN
OROMUCOSAL | Status: AC
  Administered 2021-08-19: 15 mL via OROMUCOSAL
  Filled 2021-08-19: qty 15

## 2021-08-19 MED ORDER — LACTATED RINGERS IV SOLN: INTRAVENOUS | Status: DC

## 2021-08-19 MED ORDER — CEFAZOLIN IN SODIUM CHLORIDE 3-0.9 GM/100ML-% IV SOLN
INTRAVENOUS | Status: AC
  Filled 2021-08-19: qty 100

## 2021-08-19 MED ORDER — ACETAMINOPHEN 650 MG RE SUPP: 650.0000 mg | Freq: Four times a day (QID) | RECTAL | Status: DC | PRN

## 2021-08-19 MED ORDER — ONDANSETRON HCL 4 MG/2ML IJ SOLN
INTRAMUSCULAR | Status: DC | PRN
  Administered 2021-08-19: 4 mg via INTRAVENOUS

## 2021-08-19 MED ORDER — SODIUM CHLORIDE 0.9 % IV SOLN: INTRAVENOUS | Status: DC

## 2021-08-19 MED ORDER — CEFAZOLIN IN SODIUM CHLORIDE 3-0.9 GM/100ML-% IV SOLN
3.0000 g | INTRAVENOUS | Status: AC
  Administered 2021-08-19: 3 g via INTRAVENOUS
  Filled 2021-08-19: qty 100

## 2021-08-19 MED ORDER — IBUPROFEN 600 MG PO TABS: 600.0000 mg | ORAL_TABLET | Freq: Four times a day (QID) | ORAL | Status: DC | PRN

## 2021-08-19 MED ORDER — 0.9 % SODIUM CHLORIDE (POUR BTL) OPTIME
TOPICAL | Status: DC | PRN
  Administered 2021-08-19: 1000 mL

## 2021-08-19 MED ORDER — HYDROMORPHONE HCL 1 MG/ML IJ SOLN
1.0000 mg | INTRAMUSCULAR | Status: DC | PRN
  Administered 2021-08-19: 1 mg via INTRAVENOUS
  Filled 2021-08-19: qty 1

## 2021-08-19 SURGICAL SUPPLY — 53 items
APL PRP STRL LF DISP 70% ISPRP (MISCELLANEOUS) ×1
BAG COUNTER SPONGE SURGICOUNT (BAG) ×2 IMPLANT
BAG SPNG CNTER NS LX DISP (BAG) ×1
BINDER ABDOMINAL 12 XL 75-84 (SOFTGOODS) ×1 IMPLANT
BIOPATCH RED 1 DISK 7.0 (GAUZE/BANDAGES/DRESSINGS) ×1 IMPLANT
BLADE CLIPPER SURG (BLADE) ×1 IMPLANT
CANISTER SUCT 3000ML PPV (MISCELLANEOUS) ×2 IMPLANT
CHLORAPREP W/TINT 26 (MISCELLANEOUS) ×2 IMPLANT
COVER SURGICAL LIGHT HANDLE (MISCELLANEOUS) ×2 IMPLANT
DRAIN CHANNEL 19F RND (DRAIN) ×2 IMPLANT
DRAPE LAPAROSCOPIC ABDOMINAL (DRAPES) ×1 IMPLANT
DRSG OPSITE POSTOP 4X10 (GAUZE/BANDAGES/DRESSINGS) ×2 IMPLANT
DRSG TEGADERM 4X4.75 (GAUZE/BANDAGES/DRESSINGS) ×2 IMPLANT
DRSG TELFA 3X8 NADH (GAUZE/BANDAGES/DRESSINGS) IMPLANT
ELECT REM PT RETURN 9FT ADLT (ELECTROSURGICAL) ×2
ELECTRODE REM PT RTRN 9FT ADLT (ELECTROSURGICAL) ×1 IMPLANT
EVACUATOR SILICONE 100CC (DRAIN) ×1 IMPLANT
GAUZE SPONGE 4X4 12PLY STRL (GAUZE/BANDAGES/DRESSINGS) IMPLANT
GLOVE SURG SIGNA 7.5 PF LTX (GLOVE) ×2 IMPLANT
GOWN STRL REUS W/ TWL LRG LVL3 (GOWN DISPOSABLE) ×2 IMPLANT
GOWN STRL REUS W/ TWL XL LVL3 (GOWN DISPOSABLE) ×1 IMPLANT
GOWN STRL REUS W/TWL LRG LVL3 (GOWN DISPOSABLE) ×4
GOWN STRL REUS W/TWL XL LVL3 (GOWN DISPOSABLE) ×2
HANDLE SUCTION POOLE (INSTRUMENTS) IMPLANT
KIT BASIN OR (CUSTOM PROCEDURE TRAY) ×2 IMPLANT
KIT TURNOVER KIT B (KITS) ×2 IMPLANT
MARKER SKIN DUAL TIP RULER LAB (MISCELLANEOUS) ×1 IMPLANT
MESH VENTRALIGHT ST 8X10 (Mesh General) ×1 IMPLANT
NDL HYPO 25GX1X1/2 BEV (NEEDLE) ×1 IMPLANT
NEEDLE HYPO 25GX1X1/2 BEV (NEEDLE) ×2 IMPLANT
NS IRRIG 1000ML POUR BTL (IV SOLUTION) ×4 IMPLANT
PACK GENERAL/GYN (CUSTOM PROCEDURE TRAY) ×2 IMPLANT
PAD ARMBOARD 7.5X6 YLW CONV (MISCELLANEOUS) ×2 IMPLANT
PAD DRESSING TELFA 3X8 NADH (GAUZE/BANDAGES/DRESSINGS) IMPLANT
PENCIL SMOKE EVACUATOR (MISCELLANEOUS) ×2 IMPLANT
STAPLER VISISTAT 35W (STAPLE) IMPLANT
SUCTION POOLE HANDLE (INSTRUMENTS) ×2
SUT ETHILON 2 0 FS 18 (SUTURE) ×1 IMPLANT
SUT MNCRL AB 4-0 PS2 18 (SUTURE) ×2 IMPLANT
SUT NOVA 1 T20/GS 25DT (SUTURE) ×4 IMPLANT
SUT NOVA NAB DX-16 0-1 5-0 T12 (SUTURE) IMPLANT
SUT PROLENE 1 CT (SUTURE) IMPLANT
SUT SILK 2 0 (SUTURE) ×2
SUT SILK 2 0 SH CR/8 (SUTURE) ×1 IMPLANT
SUT SILK 2-0 18XBRD TIE 12 (SUTURE) IMPLANT
SUT SILK 3 0 SH CR/8 (SUTURE) ×1 IMPLANT
SUT VIC AB 3-0 SH 27 (SUTURE) ×2
SUT VIC AB 3-0 SH 27XBRD (SUTURE) ×1 IMPLANT
SYR CONTROL 10ML LL (SYRINGE) ×2 IMPLANT
TOWEL GREEN STERILE (TOWEL DISPOSABLE) ×2 IMPLANT
TOWEL GREEN STERILE FF (TOWEL DISPOSABLE) ×2 IMPLANT
TRAY FOL W/BAG SLVR 16FR STRL (SET/KITS/TRAYS/PACK) IMPLANT
TRAY FOLEY W/BAG SLVR 16FR LF (SET/KITS/TRAYS/PACK) ×2

## 2021-08-19 NOTE — H&P (Signed)
Tyrone Fernandez [birth year]  [id number].    Chief Complaint/Reason for Consult: low grade SBO secondary to ventral incisional hernia  HPI:  This is a very pleasant otherwise healthy 56 yo male.  He has a history of obesity as well as prior colectomy/colostomy, colostomy takedown, ex lap for SBO with subsequent cholecystectomy and primary ventral hernia repair in the 2000s by Dr. Zachery Dakins.  He had a partial SBO in 2017, but resolved with conservative management.    This morning at 0415 he awoke from sleep secondary to RLQ abdominal pain.  He developed some nausea, but could only dry heave.  He had a BM yesterday, but small. He thinks he passed some flatus today. He presented to the ED secondary to this pain that has progressed.   He has been noted to have a WBC of 11K, but the rest of his labs are normal.  He underwent a CT scan that reveals 2 ventral abdominal wall hernias containing colon and small bowel.  The more caudal hernia causes a low-grade partial SBO where it exits.  We have been asked to see the patient for further evaluation and recommendations.  ROS: ROS: Please see HPI, otherwise all other systems have been reviewed and are negative.  Family History  Problem Relation Age of Onset   Diabetes Mother    Cancer Mother        breast cancer    Stroke Maternal Aunt    Heart disease Paternal Grandfather     Past Medical History:  Diagnosis Date   H/O hiatal hernia    History of kidney stones    SBO (small bowel obstruction) ([facility]) 03/2016    Past Surgical History:  Procedure Laterality Date   1984  KNEE SURGERY     2007 OR 2008  KIDNEY STONE REMOVED     APPENDECTOMY  2003   DONE AT TIME OF COLON RESECTION   CHOLECYSTECTOMY  2010   AT SAME TIME OF HERNIA REPAIR   COLON SURGERY  2003   COLON RESECTION FOR DIVERTICULITIS / COLOSTOMY   COLOSTOMY REVERSAL  2003   CYSTOSCOPY WITH URETEROSCOPY AND STENT PLACEMENT Right 05/03/2013   Procedure: CYSTOSCOPY, RIGHT RETROGRADE  PYELOGRAMRIGHT URETEROSCOPY, STONE EXTRACTION  AND STENT PLACEMENT;  Surgeon: Antony Haste, MD;  Location: [facility];  Service: Urology;  Laterality: Right;   HERNIA REPAIR  2010    Social History:  reports that he has never smoked. He has never used smokeless tobacco. He reports that he does not drink alcohol and does not use drugs.  Allergies: No Known Allergies  (Not in a hospital admission)    Physical Exam: Blood pressure 131/78, pulse 83, temperature 98.1 F (36.7 C), temperature source Oral, resp. rate 10, height 5\' 10"  (1.778 m), weight 130.2 kg, SpO2 96 %. General: pleasant, WD, WN white male who is laying in bed in NAD HEENT: head is normocephalic, atraumatic.  Sclera are noninjected.  PERRL.  Ears and nose without any masses or lesions.  Mouth is pink and moist Heart: regular, rate, and rhythm.  Normal s1,s2. No obvious murmurs, gallops, or rubs noted.  Palpable radial and pedal pulses bilaterally Lungs: CTAB, no wheezes, rhonchi, or rales noted.  Respiratory effort nonlabored Abd: soft, tender with a hard area in the RLQ c/w small bowel incarcerated hernia noted on CT, more cranial hernia noted as well and is soft, obese, +BS, no masses or organomegaly MS: all 4 extremities are symmetrical with no cyanosis, clubbing, or  edema. Skin: warm and dry with no masses, lesions, or rashes Neuro: Cranial nerves 2-12 grossly intact, sensation is normal throughout Psych: A&Ox3 with an appropriate affect.   Results for orders placed or performed during the hospital encounter of 08/19/21 (from the past 48 hour(s))  Urinalysis, Routine w reflex microscopic Urine, Clean Catch     Status: None   Collection Time: 08/19/21  6:53 AM  Result Value Ref Range   Color, Urine YELLOW YELLOW   APPearance CLEAR CLEAR   Specific Gravity, Urine 1.017 1.005 - 1.030   pH 6.0 5.0 - 8.0   Glucose, UA NEGATIVE NEGATIVE mg/dL   Hgb urine dipstick NEGATIVE NEGATIVE   Bilirubin Urine NEGATIVE  NEGATIVE   Ketones, ur NEGATIVE NEGATIVE mg/dL   Protein, ur NEGATIVE NEGATIVE mg/dL   Nitrite NEGATIVE NEGATIVE   Leukocytes,Ua NEGATIVE NEGATIVE    Comment: Performed at [facility], [address] [place], [place] [address]  Lipase, blood     Status: None   Collection Time: 08/19/21  7:03 AM  Result Value Ref Range   Lipase 29 11 - 51 U/L    Comment: Performed at [facility], [address] [place], [place] [address]  Comprehensive metabolic panel     Status: Abnormal   Collection Time: 08/19/21  7:03 AM  Result Value Ref Range   Sodium 136 135 - 145 mmol/L   Potassium 3.7 3.5 - 5.1 mmol/L   Chloride 102 98 - 111 mmol/L   CO2 23 22 - 32 mmol/L   Glucose, Bld 138 (H) 70 - 99 mg/dL    Comment: Glucose reference range applies only to samples taken after fasting for at least 8 hours.   BUN 15 6 - 20 mg/dL   Creatinine, Ser 0.26 0.61 - 1.24 mg/dL   Calcium 9.5 8.9 - 37.8 mg/dL   Total Protein 8.3 (H) 6.5 - 8.1 g/dL   Albumin 3.8 3.5 - 5.0 g/dL   AST 28 15 - 41 U/L   ALT 27 0 - 44 U/L   Alkaline Phosphatase 81 38 - 126 U/L   Total Bilirubin 0.4 0.3 - 1.2 mg/dL   GFR, Estimated >58 >85 mL/min    Comment: (NOTE) Calculated using the CKD-EPI Creatinine Equation (2021)    Anion gap 11 5 - 15    Comment: Performed at [facility], [address], [place], [place] [address]  CBC     Status: Abnormal   Collection Time: 08/19/21  7:03 AM  Result Value Ref Range   WBC 11.1 (H) 4.0 - 10.5 K/uL   RBC 5.79 4.22 - 5.81 MIL/uL   Hemoglobin 15.8 13.0 - 17.0 g/dL   HCT 12.8 78.6 - 76.7 %   MCV 84.1 80.0 - 100.0 fL   MCH 27.3 26.0 - 34.0 pg   MCHC 32.4 30.0 - 36.0 g/dL   RDW 20.9 47.0 - 96.2 %   Platelets 366 150 - 400 K/uL   nRBC 0.0 0.0 - 0.2 %    Comment: Performed at [facility], [address] [place], [place] [address]   CT ABDOMEN PELVIS W CONTRAST  Result Date: 08/19/2021 CLINICAL DATA:  Abdominal pain.  Nausea and vomiting. EXAM: CT ABDOMEN AND  PELVIS WITH CONTRAST TECHNIQUE: Multidetector CT imaging of the abdomen and pelvis was performed using the standard protocol following bolus administration of intravenous contrast. RADIATION DOSE REDUCTION: This exam was performed according to the departmental dose-optimization program which includes automated  exposure control, adjustment of the mA and/or kV according to patient size and/or use of iterative reconstruction technique. CONTRAST:  OMNIPAQUE IOHEXOL 300 MG/ML  SOLN COMPARISON:  04/10/2016 plain film.  CT 04/08/2016. FINDINGS: Lower chest: Clear lung bases. Mild cardiomegaly, without pericardial or pleural effusion. Hepatobiliary: Normal liver. Cholecystectomy, without biliary ductal dilatation. Pancreas: Fatty replaced pancreatic body and head. No duct dilatation or acute inflammation. Spleen: Normal in size, without focal abnormality. Adrenals/Urinary Tract: Normal adrenal glands. Left renal collecting system stones, including a dominant 1.6 cm renal pelvic stone. Lower pole right renal 1.8 cm minimally complex cyst, as evidenced by dependent calcifications. Too small to characterize left renal lesions. No hydronephrosis. Normal urinary bladder. Stomach/Bowel: Normal stomach, without wall thickening. Normal terminal ileum. Again identified are 2 right abdominopelvic wall ventral hernias. The more cephalad contains nonobstructive large and small bowel including on 45/3. The more caudal contains small bowel with partial obstruction at its exit site on images 64 through 61 of series 3. Proximal fluid-filled small bowel loops enter the hernia at 2.6 cm with a "small bowel feces sign" on 64/3. No evidence of strangulation. No pneumatosis or free intraperitoneal air. Vascular/Lymphatic: Aortic atherosclerosis. Upper normal sized porta hepatis nodes including at up to 1.4 cm are most likely reactive. Relatively similar to 2017. No pelvic sidewall adenopathy. Reproductive: Mild prostatomegaly. Other: No  significant free fluid. 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 Moderate convex left thoracolumbar spine curvature with secondary spondylosis. IMPRESSION: 1. Redemonstration of 2 ventral abdominal wall hernias, containing bowel. The more caudal hernia causes low-grade partial small bowel obstruction at its exit. 2. Left nephrolithiasis 3.  Aortic Atherosclerosis (ICD10-I70.0). Electronically Signed   By: Jeronimo Greaves M.D.   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  He does appear to have a low grade PSBO noted on his CT scan and clinical findings to go along with this.  Will review his imaging with Dr. Magnus Ivan to determine if he needs emergent surgery today vs tomorrow.  I do think he will need this repaired this admission.  Unable to reduce these hernias.  Will keep NPO for now and admit to the hospital.   FEN - NPO/IVFs VTE - lovenox when timing of surgery determined ID - none needed currently Admit - inpatient High Medical Decision Making  Letha Cape, [facility] Surgery 08/19/2021, 10:08 AM Please see Amion for pager number during day hours 7:00am-4:30pm or 7:00am -11:30am on weekends

## 2021-08-19 NOTE — Progress Notes (Signed)
Patient arrived to 6 north room 14 alert and oriented x4 pain level 5/10, bed in lowest position call light in reach will continue to monitor pt.

## 2021-08-19 NOTE — Transfer of Care (Signed)
Immediate Anesthesia Transfer of Care Note  Patient: Tyrone Fernandez  Procedure(s) Performed: OPEN HERNIA REPAIR VENTRAL ADULT (Abdomen)  Patient Location: PACU  Anesthesia Type:General  Level of Consciousness: awake, alert  and oriented  Airway & Oxygen Therapy: Patient Spontanous Breathing and Patient connected to face mask oxygen  Post-op Assessment: Report given to RN and Post -op Vital signs reviewed and stable  Post vital signs: Reviewed and stable  Last Vitals:  Vitals Value Taken Time  BP 108/64 08/19/21 1508  Temp    Pulse 110 08/19/21 1508  Resp 23 08/19/21 1508  SpO2 94 % 08/19/21 1508  Vitals shown include unvalidated device data.  Last Pain:  Vitals:   08/19/21 1146  TempSrc: Oral  PainSc: 0-No pain         Complications: No notable events documented.

## 2021-08-19 NOTE — Anesthesia Procedure Notes (Signed)
Procedure Name: Intubation Date/Time: 08/19/2021 12:56 PM Performed by: Leonor Liv, CRNA Pre-anesthesia Checklist: Patient identified, Emergency Drugs available, Suction available and Patient being monitored Patient Re-evaluated:Patient Re-evaluated prior to induction Oxygen Delivery Method: Circle System Utilized Preoxygenation: Pre-oxygenation with 100% oxygen Induction Type: IV induction, Rapid sequence and Cricoid Pressure applied Ventilation: Mask ventilation without difficulty Laryngoscope Size: Mac and 4 Grade View: Grade I Tube type: Oral Tube size: 7.5 mm Number of attempts: 1 Airway Equipment and Method: Stylet and Oral airway Placement Confirmation: ETT inserted through vocal cords under direct vision, positive ETCO2 and breath sounds checked- equal and bilateral Secured at: 23 cm Tube secured with: Tape Dental Injury: Teeth and Oropharynx as per pre-operative assessment

## 2021-08-19 NOTE — Anesthesia Preprocedure Evaluation (Addendum)
Anesthesia Evaluation  Patient identified by MRN, date of birth, ID band Patient awake    Reviewed: Allergy & Precautions, NPO status , Patient's Chart, lab work & pertinent test results  History of Anesthesia Complications Negative for: history of anesthetic complications  Airway Mallampati: III  TM Distance: >3 FB Neck ROM: Full    Dental  (+) Dental Advisory Given, Missing   Pulmonary neg pulmonary ROS,    Pulmonary exam normal        Cardiovascular negative cardio ROS Normal cardiovascular exam     Neuro/Psych negative neurological ROS  negative psych ROS   GI/Hepatic Neg liver ROS, hiatal hernia,   Endo/Other  Morbid obesity  Renal/GU negative Renal ROS     Musculoskeletal negative musculoskeletal ROS (+)   Abdominal (+) + obese,   Peds  Hematology negative hematology ROS (+)   Anesthesia Other Findings   Reproductive/Obstetrics                            Anesthesia Physical Anesthesia Plan  ASA: 3  Anesthesia Plan: General   Post-op Pain Management: Toradol IV (intra-op)* and Ofirmev IV (intra-op)*   Induction: Intravenous and Rapid sequence  PONV Risk Score and Plan: 2 and Treatment may vary due to age or medical condition, Ondansetron, Dexamethasone and Midazolam  Airway Management Planned: Oral ETT  Additional Equipment: None  Intra-op Plan:   Post-operative Plan: Extubation in OR  Informed Consent: I have reviewed the patients History and Physical, chart, labs and discussed the procedure including the risks, benefits and alternatives for the proposed anesthesia with the patient or authorized representative who has indicated his/her understanding and acceptance.     Dental advisory given  Plan Discussed with: CRNA and Anesthesiologist  Anesthesia Plan Comments:        Anesthesia Quick Evaluation

## 2021-08-19 NOTE — ED Triage Notes (Signed)
Pt c/o abd pain, nausea and vomiting for the past hour.

## 2021-08-19 NOTE — ED Provider Notes (Signed)
[facility] EMERGENCY DEPARTMENT  Provider Note  CSN: [id number] Arrival date & time: 08/19/21 0645  History Chief Complaint  Patient presents with   Abdominal Pain   Emesis    Tyrone Fernandez is a 56 y.o. male presents today with abdominal pain.  Started suddenly this morning approximately 3 hours ago.  Has been constant since onset.  His pain 10/10.  States it feels similar to when he has had strangulated hernias in the past.  He has had nausea and vomiting this morning.  Passing gas.  Pain is located near and around his umbilicus.   Home Medications Prior to Admission medications   Medication Sig Start Date End Date Taking? Authorizing Provider  calcium carbonate (TUMS - DOSED IN MG ELEMENTAL CALCIUM) 500 MG chewable tablet Chew 4 tablets by mouth daily as needed for indigestion or heartburn.   Yes [provider]  ibuprofen (ADVIL) 200 MG tablet Take 600 mg by mouth every 6 (six) hours as needed for headache or moderate pain.   Yes [provider]  levocetirizine (XYZAL) 5 MG tablet Take 5 mg by mouth daily as needed for allergies.   Yes [provider]  LORazepam (ATIVAN) 1 MG tablet Take 1 tablet (1 mg total) by mouth 3 (three) times daily as needed for anxiety. 04/29/21  Yes Hayden Rasmussen, MD  naproxen (NAPROSYN) 500 MG tablet Take 1 tablet (500 mg total) by mouth 2 (two) times daily with a meal. Patient not taking: Reported on 08/19/2021 01/30/20   Jaynee Eagles, PA-C  albuterol (PROVENTIL HFA;VENTOLIN HFA) 108 (90 BASE) MCG/ACT inhaler Inhale 1-2 puffs into the lungs every 6 (six) hours as needed for wheezing or shortness of breath. Patient not taking: Reported on 04/08/2016 09/03/14 01/30/20  Comer Locket, PA-C  fluticasone [facility]) 50 MCG/ACT nasal spray Place 2 sprays into both nostrils daily. Patient not taking: Reported on 04/08/2016 08/26/14 01/30/20  Waynetta Pean, PA-C  omeprazole (PRILOSEC) 10 MG capsule Take 1 capsule (10 mg  total) by mouth daily. 11/07/17 01/30/20  Cuthriell, Charline Bills, PA-C     Allergies    Patient has no known allergies.   Review of Systems   Review of Systems  Constitutional:  Negative for chills and fever.  HENT:  Negative for ear pain and sore throat.   Eyes:  Negative for pain and visual disturbance.  Respiratory:  Negative for cough and shortness of breath.   Cardiovascular:  Negative for chest pain and palpitations.  Gastrointestinal:  Positive for abdominal pain, nausea and vomiting.  Genitourinary:  Negative for dysuria and hematuria.  Musculoskeletal:  Negative for arthralgias and back pain.  Skin:  Negative for color change and rash.  Neurological:  Negative for seizures and syncope.  All other systems reviewed and are negative. Please see HPI for pertinent positives and negatives  Physical Exam BP 131/78    Pulse 83    Temp 98.1 F (36.7 C) (Oral)    Resp 10    Ht 5\' 10"  (1.778 m)    Wt 130.2 kg    SpO2 96%    BMI 41.18 kg/m   Physical Exam Vitals and nursing note reviewed.  Constitutional:      General: He is not in acute distress.    Appearance: He is well-developed.  HENT:     Head: Normocephalic and atraumatic.  Eyes:     Conjunctiva/sclera: Conjunctivae normal.  Cardiovascular:     Rate and Rhythm: Normal rate and regular rhythm.  Heart sounds: No murmur heard. Pulmonary:     Effort: Pulmonary effort is normal. No respiratory distress.     Breath sounds: Normal breath sounds.  Abdominal:     Palpations: Abdomen is soft.     Tenderness: There is generalized abdominal tenderness and tenderness in the epigastric area and periumbilical area.     Comments: Multiple firm feeling knots on his abdomen.  Not reducible.  Patient reports that he has history of multiple hernias.  Musculoskeletal:        General: No swelling.     Cervical back: Neck supple.  Skin:    General: Skin is warm and dry.     Capillary Refill: Capillary refill takes less than 2 seconds.   Neurological:     Mental Status: He is alert.  Psychiatric:        Mood and Affect: Mood normal.    ED Results / Procedures / Treatments   EKG None  Procedures Procedures  Medications Ordered in the ED Medications  enoxaparin (LOVENOX) injection 40 mg (has no administration in time range)  0.9 %  sodium chloride infusion (has no administration in time range)  hydrALAZINE (APRESOLINE) injection 10 mg (has no administration in time range)  pantoprazole (PROTONIX) injection 40 mg (has no administration in time range)  simethicone (MYLICON) chewable tablet 40 mg (has no administration in time range)  ondansetron (ZOFRAN-ODT) disintegrating tablet 4 mg (has no administration in time range)    Or  ondansetron (ZOFRAN) injection 4 mg (has no administration in time range)  diphenhydrAMINE (BENADRYL) capsule 25 mg (has no administration in time range)    Or  diphenhydrAMINE (BENADRYL) injection 25 mg (has no administration in time range)  methocarbamol (ROBAXIN) tablet 500 mg (has no administration in time range)  HYDROmorphone (DILAUDID) injection 1 mg (has no administration in time range)  oxyCODONE (Oxy IR/ROXICODONE) immediate release tablet 5-10 mg (has no administration in time range)  acetaminophen (TYLENOL) tablet 650 mg (has no administration in time range)    Or  acetaminophen (TYLENOL) suppository 650 mg (has no administration in time range)  calcium carbonate (TUMS - dosed in mg elemental calcium) chewable tablet 800 mg of elemental calcium (has no administration in time range)  ibuprofen (ADVIL) tablet 600 mg (has no administration in time range)  LORazepam (ATIVAN) tablet 1 mg (has no administration in time range)  lactated ringers bolus 1,000 mL (0 mLs Intravenous Stopped 08/19/21 0916)  HYDROmorphone (DILAUDID) injection 0.5 mg (0.5 mg Intravenous Given 08/19/21 0731)  iohexol (OMNIPAQUE) 300 MG/ML solution 100 mL (100 mLs Intravenous Contrast Given 08/19/21 0808)   HYDROmorphone (DILAUDID) injection 0.5 mg (0.5 mg Intravenous Given 08/19/21 0914)    Initial Impression and Plan 56 year old male who presented today with acute onset of abdominal pain.  He has history of multiple belly surgeries.  He has noted hard not knots in his abdomen that seem to be more painful than normal.  ED Course   Clinical Course as of 08/19/21 1023  Mon Aug 19, 2021  0719 Assessed patient at bedside. Ongoing pain and tachycardia. Fluids ordered, IV pain medications ordered, nausea medications ordered. CT scan Abd ordered for further evaluation of pain [AB]    Clinical Course User Index [AB] Jacelyn Pi, MD     MDM Rules/Calculators/A&P   Additional history obtained: Additional history obtained from family   Lab Tests: I Ordered, and personally interpreted labs.  The pertinent results include:  Mild leukocytosis  Imaging Studies ordered: I ordered imaging  studies including CT scan abdomen   I independently visualized and interpreted imaging which showed 2 ventral hernias.  1 causing mild small bowel obstruction. I agree with the radiologist interpretation   Medical Decision Making: Patient here with acute abdominal pain.  Started suddenly at this point.  Exam concerning for possible strangulated hernia.  CT scan did show 2 ventral hernias, 1 of which was causing obstruction.  He was in severe pain.  Was given 2 doses of IV Dilaudid.  The general surgery team was consulted and they plan for admission to the hospital.  Complexity of problems addressed: Patients presentation is most consistent with  acute presentation with potential threat to life or bodily function  Disposition: After consideration of the diagnostic results and the patients response to treatment,  I feel that the patent would benefit from admission to general surgery .   Patient seen in conjunction with my attending, Dr. Jeanell Sparrow.      Final Clinical Impression(s) / ED Diagnoses Final  diagnoses:  Nausea and vomiting, unspecified vomiting type  Abdominal pain, unspecified abdominal location  H/O ventral hernia    Rx / DC Orders ED Discharge Orders     None         Jacelyn Pi, MD 08/19/21 1023    Pattricia Boss, MD 08/19/21 [phone]

## 2021-08-19 NOTE — Anesthesia Postprocedure Evaluation (Signed)
Anesthesia Post Note  Patient: Tyrone Fernandez  Procedure(s) Performed: OPEN HERNIA REPAIR VENTRAL ADULT (Abdomen)     Patient location during evaluation: PACU Anesthesia Type: General Level of consciousness: awake and alert Pain management: pain level controlled Vital Signs Assessment: post-procedure vital signs reviewed and stable Respiratory status: spontaneous breathing, nonlabored ventilation, respiratory function stable and patient connected to nasal cannula oxygen Cardiovascular status: blood pressure returned to baseline and stable Postop Assessment: no apparent nausea or vomiting Anesthetic complications: no   No notable events documented.  Last Vitals:  Vitals:   08/19/21 1555 08/19/21 1649  BP: (!) 109/57 122/75  Pulse: 91 (!) 102  Resp: (!) 7 16  Temp: 36.7 C 36.9 C  SpO2: 97% 95%    Last Pain:  Vitals:   08/19/21 1649  TempSrc: Oral  PainSc:                  Tyrone Fernandez

## 2021-08-19 NOTE — Op Note (Signed)
° °  Tyrone Fernandez 08/19/2021   Pre-op Diagnosis: Incarcerated incisional hernia (15 cm facial defect)     Post-op Diagnosis: Same  Procedure(s): Open repair of incarcerated incisional hernia with mesh (20 cm x 25 cm ventralite ST) 1 hour lysis of adhesions  Surgeon(s): Abigail Miyamoto, MD  Assistant: Isabella Stalling, MD [facility] Resident  Anesthesia: General  Staff:  Circulator: Harriet Butte, RN Scrub Person: Crista Luria, RN  Estimated Blood Loss: less than 100 mL               Findings: The patient was found to have 2 fascial defects.  One containing transverse colon and the other contained a loop of small bowel which was slowly becoming strangulated.  The 2 fascial defects were joined as 1 making a 15 cm fascial defect.  The mesh was repaired with a 20 cm x 25 cm piece of ventral light ST coated Prolene mesh from [employer]  Procedure: The patient is brought to operating identifies correct patient.  He is placed upon the operating table and general anesthesia was induced.  His abdomen was then prepped and draped in usual sterile fashion.  I created a midline incision through the patient's previous scar with a scalpel.  I then dissected down to the fascial defect and hernia sacs with the cautery.  The patient had a large about 10 cm fascial defect containing transverse colon and then another 5 cm fascial defect containing incarcerated small bowel.  The small bowel is becoming slowly strangulated but once it was freed from the hernia appeared viable.  I did make the fascial incision larger in order to reduce the small bowel.  I then opened the fascial bridge between the 2 hernias creating 1 large 15 cm fascial defect.  We then did 1 hour of lysis of adhesions freeing up multiple loops of small bowel from the surrounding fascia.  We ran the small bowel and found 1 small serosal tear which was repaired with interrupted 3-0 silk sutures.  We irrigated the abdomen with a  liter of normal saline.  Hemostasis appeared to be achieved. Next a piece of 20 cm x 25 cm Prolene ventral light ST mesh was brought onto the field.  It was placed as an underlay around the fascia and then sewn in place circumferentially with #1 Novafil sutures under direct vision.  Wide greater than 5 cm coverage appeared to be achieved circumferentially.  We were then able to close the fascia over the top of the mesh with a running #1 Prolene suture.  We again irrigated the area with saline.  Next a 80 [place] Blake drain was placed through a separate skin incision under direct vision.  The drain was placed into the subcutaneous tissue.  It was sutured in place with a 2-0 nylon suture.  We then closed the subcutaneous tissue with interrupted 3-0 Vicryl sutures and closed the skin with staples.  The patient tolerated the procedure well.  All the counts were correct at the end of the procedure.  The patient was then extubated in the operating room and taken in stable addition to the recovery room.          Abigail Miyamoto   Date: 08/19/2021  Time: 2:56 PM

## 2021-08-19 NOTE — ED Notes (Addendum)
This RN gave report to [facility] RN and Obtained consent for procedure. Pt is undressed and he gave all belongings to his girlfriend to take home.

## 2021-08-20 ENCOUNTER — Encounter (HOSPITAL_COMMUNITY): Payer: Self-pay | Admitting: Surgery

## 2021-08-20 LAB — CBC
HCT: 43.2 % (ref 39.0–52.0)
Hemoglobin: 14 g/dL (ref 13.0–17.0)
MCH: 27.5 pg (ref 26.0–34.0)
MCHC: 32.4 g/dL (ref 30.0–36.0)
MCV: 84.9 fL (ref 80.0–100.0)
Platelets: 322 10*3/uL (ref 150–400)
RBC: 5.09 MIL/uL (ref 4.22–5.81)
RDW: 13.7 % (ref 11.5–15.5)
WBC: 12.9 10*3/uL — ABNORMAL HIGH (ref 4.0–10.5)
nRBC: 0 % (ref 0.0–0.2)

## 2021-08-20 LAB — BASIC METABOLIC PANEL
Anion gap: 8 (ref 5–15)
BUN: 13 mg/dL (ref 6–20)
CO2: 24 mmol/L (ref 22–32)
Calcium: 9.1 mg/dL (ref 8.9–10.3)
Chloride: 103 mmol/L (ref 98–111)
Creatinine, Ser: 0.78 mg/dL (ref 0.61–1.24)
GFR, Estimated: >60 mL/min (ref >60)
Glucose, Bld: 146 mg/dL — ABNORMAL HIGH (ref 70–99)
Potassium: 4.5 mmol/L (ref 3.5–5.1)
Sodium: 135 mmol/L (ref 135–145)

## 2021-08-20 MED ORDER — ACETAMINOPHEN 325 MG PO TABS
650.0000 mg | ORAL_TABLET | Freq: Four times a day (QID) | ORAL | Status: DC
  Filled 2021-08-20: qty 2

## 2021-08-20 MED ORDER — CHLORHEXIDINE GLUCONATE CLOTH 2 % EX PADS
6.0000 | MEDICATED_PAD | Freq: Every day | CUTANEOUS | Status: DC
  Administered 2021-08-20 – 2021-08-23 (×4): 6 via TOPICAL

## 2021-08-20 MED ORDER — ACETAMINOPHEN 500 MG PO TABS
1000.0000 mg | ORAL_TABLET | Freq: Four times a day (QID) | ORAL | Status: DC
  Administered 2021-08-21 – 2021-08-23 (×9): 1000 mg via ORAL
  Filled 2021-08-20 (×10): qty 2

## 2021-08-20 MED ORDER — IBUPROFEN 600 MG PO TABS
600.0000 mg | ORAL_TABLET | Freq: Three times a day (TID) | ORAL | Status: DC
Start: 2021-08-20 — End: 2021-08-23
  Administered 2021-08-20 – 2021-08-23 (×8): 600 mg via ORAL
  Filled 2021-08-20 (×10): qty 1

## 2021-08-20 MED ORDER — METHOCARBAMOL 750 MG PO TABS: 750.0000 mg | ORAL_TABLET | Freq: Four times a day (QID) | ORAL | Status: DC | PRN

## 2021-08-20 MED ORDER — HYDROMORPHONE HCL 1 MG/ML IJ SOLN
0.5000 mg | INTRAMUSCULAR | Status: DC | PRN
  Administered 2021-08-20 – 2021-08-21 (×3): 1 mg via INTRAVENOUS
  Filled 2021-08-20 (×3): qty 1

## 2021-08-20 MED ORDER — TRAMADOL HCL 50 MG PO TABS
50.0000 mg | ORAL_TABLET | Freq: Four times a day (QID) | ORAL | Status: DC | PRN
  Administered 2021-08-20: 100 mg via ORAL
  Filled 2021-08-20: qty 2

## 2021-08-20 NOTE — Discharge Instructions (Signed)
[facility] _______[facility]  Ventral HERNIA REPAIR: POST OP INSTRUCTIONS  Always review your discharge instruction sheet given to you by the facility where your surgery was performed. IF YOU HAVE DISABILITY OR FAMILY LEAVE FORMS, YOU MUST BRING THEM TO THE OFFICE FOR PROCESSING.   DO NOT GIVE THEM TO YOUR DOCTOR.  1. A  prescription for pain medication may be given to you upon discharge.  Take your pain medication as prescribed, if needed.  If narcotic pain medicine is not needed, then you may take acetaminophen (Tylenol) or ibuprofen (Advil) as needed. 2. Take your usually prescribed medications unless otherwise directed. If you need a refill on your pain medication, please contact your pharmacy.  They will contact our office to request authorization. Prescriptions will not be filled after 5 pm or on week-ends. 3. You should follow a light diet the first 24 hours after arrival home, such as soup and crackers, etc.  Be sure to include lots of fluids daily.  Resume your normal diet the day after surgery. 4.Most patients will experience some swelling and bruising around the umbilicus or in the groin and scrotum.  Ice packs and reclining will help.  Swelling and bruising can take several days to resolve.  6. It is common to experience some constipation if taking pain medication after surgery.  Increasing fluid intake and taking a stool softener (such as Colace) will usually help or prevent this problem from occurring.  A mild laxative (Milk of Magnesia or Miralax) should be taken according to package directions if there are no bowel movements after 48 hours. 7. Unless discharge instructions indicate otherwise, you may remove your bandages 24-48 hours after surgery, and you may shower at that time.  You may have steri-strips (small skin tapes) in place directly over the incision.  These strips should be left on the skin for 7-10 days.  If your surgeon used skin glue on the incision, you may  shower in 24 hours.  The glue will flake off over the next 2-3 weeks.  Any sutures or staples will be removed at the office during your follow-up visit. 8. ACTIVITIES:  You may resume regular (light) daily activities beginning the next day--such as daily self-care, walking, climbing stairs--gradually increasing activities as tolerated.  You may have sexual intercourse when it is comfortable.  Refrain from any heavy lifting or straining until approved by your doctor.  a.You may drive when you are no longer taking prescription pain medication, you can comfortably wear a seatbelt, and you can safely maneuver your car and apply brakes.  9.You should see your doctor in the office for a follow-up appointment approximately 2-3 weeks after your surgery.  Make sure that you call for this appointment within a day or two after you arrive home to insure a convenient appointment time.   WHEN TO CALL YOUR DOCTOR: Fever over 101.0 Inability to urinate Nausea and/or vomiting Extreme swelling or bruising Continued bleeding from incision. Increased pain, redness, or drainage from the incision  The clinic staff is available to answer your questions during regular business hours.  Please dont hesitate to call and ask to speak to one of the nurses for clinical concerns.  If you have a medical emergency, go to the nearest emergency room or call 911.  A surgeon from [facility] is always on call at the hospital   [address], [place], [place]  [address] ?  [address] [address], [place], [place]   [address] (  [phone] ? [phone] ? FAX [phone] Web site: [email or web address]

## 2021-08-20 NOTE — Progress Notes (Signed)
Patient refused scheduled tylenol. States that tylenol does not help any with his pain. Patient stated that he wanted PRN tramadol instead. PRN med given as requested by patient

## 2021-08-20 NOTE — Progress Notes (Signed)
Mobility Specialist Progress Note:   08/20/21 1534  Mobility  Activity Refused mobility   Pt has 6/10 abdomen pain, Will follow-up as time allows.   [facility] Public librarian Phone [phone]

## 2021-08-20 NOTE — TOC Progression Note (Signed)
Transition of Care [facility]) - Progression Note    Patient Details  Name: MUHAMMED TEUTSCH MRN: [id number] Date of Birth: 10-06-1965  Transition of Care [facility]) CM/SW Contact  Nadene Rubins Adria Devon, RN Phone Number: 08/20/2021, 10:43 AM  Clinical Narrative:     Confirmed face sheet information with patient at bedside.   Patient does not have PCP. Follow up appointment scheduled with Gwinda Passe on September 10, 2021 at 2:30 pm. Information placed on AVS.   Discussed [facility] , patient in agreement to have discharge scripts sent to [facility]. [facility] will call patient with cost, once scripts received . Patient voiced understanding.     Expected Discharge Plan: Home/Self Care Barriers to Discharge: Continued Medical Work up  Expected Discharge Plan and Services Expected Discharge Plan: Home/Self Care   Discharge Planning Services: CM Consult, Indigent Health Clinic, [facility], Medication Assistance Post Acute Care Choice: NA Living arrangements for the past 2 months: Single Family Home                 DME Arranged: N/A DME Agency: NA       HH Arranged: NA           Social Determinants of Health (SDOH) Interventions    Readmission Risk Interventions No flowsheet data found.

## 2021-08-20 NOTE — Progress Notes (Signed)
1 Day Post-Op  Subjective: Some pain as expected.  Hasn't been up yet.  Foley in place with good UOP.  Slept well with CPAP.  Minimal nausea.  + flatus.  No BM.  Hasn't really drank his liquids yet this morning.  ROS: See above, otherwise other systems negative  Objective: Vital signs in last 24 hours: Temp:  [97.9 F (36.6 C)-98.5 F (36.9 C)] 98 F (36.7 C) (02/21 0323) Pulse Rate:  [81-107] 90 (02/21 0323) Resp:  [7-21] 18 (02/21 0323) BP: (108-138)/(57-85) 117/75 (02/21 0323) SpO2:  [92 %-98 %] 95 % (02/21 0323) Weight:  [129.7 kg] 129.7 kg (02/20 1146) Last BM Date : 08/18/21  Intake/Output from previous day: 02/20 0701 - 02/21 0700 In: 3083.2 [P.O.:240; I.V.:2743.2; IV Piggyback:100] Out: 1210 [Urine:1000; Drains:110; Blood:100] Intake/Output this shift: No intake/output data recorded.  PE: Abd: soft, obese, appropriately tender, JP drain with old bloody output.  Some BS, not overtly distended.   GU: yellow urin in foley bag present  Lab Results:  Recent Labs    08/19/21 0703 08/20/21 0237  WBC 11.1* 12.9*  HGB 15.8 14.0  HCT 48.7 43.2  PLT 366 322   BMET Recent Labs    08/19/21 0703 08/20/21 0237  NA 136 135  K 3.7 4.5  CL 102 103  CO2 23 24  GLUCOSE 138* 146*  BUN 15 13  CREATININE 0.89 0.78  CALCIUM 9.5 9.1   PT/INR No results for input(s): LABPROT, INR in the last 72 hours. CMP     Component Value Date/Time   NA 135 08/20/2021 0237   K 4.5 08/20/2021 0237   CL 103 08/20/2021 0237   CO2 24 08/20/2021 0237   GLUCOSE 146 (H) 08/20/2021 0237   BUN 13 08/20/2021 0237   CREATININE 0.78 08/20/2021 0237   CREATININE 0.82 09/27/2014 1206   CALCIUM 9.1 08/20/2021 0237   PROT 8.3 (H) 08/19/2021 0703   ALBUMIN 3.8 08/19/2021 0703   AST 28 08/19/2021 0703   ALT 27 08/19/2021 0703   ALKPHOS 81 08/19/2021 0703   BILITOT 0.4 08/19/2021 0703   GFRNONAA >60 08/20/2021 0237   GFRNONAA >89 09/27/2014 1206   GFRAA >60 04/10/2016 0507   GFRAA >89  09/27/2014 1206   Lipase     Component Value Date/Time   LIPASE 29 08/19/2021 0703       Studies/Results: CT ABDOMEN PELVIS W CONTRAST  Result Date: 08/19/2021 CLINICAL DATA:  Abdominal pain.  Nausea and vomiting. EXAM: CT ABDOMEN AND PELVIS WITH CONTRAST TECHNIQUE: Multidetector CT imaging of the abdomen and pelvis was performed using the standard protocol following bolus administration of intravenous contrast. RADIATION DOSE REDUCTION: This exam was performed according to the departmental dose-optimization program which includes automated exposure control, adjustment of the mA and/or kV according to patient size and/or use of iterative reconstruction technique. CONTRAST:  OMNIPAQUE IOHEXOL 300 MG/ML  SOLN COMPARISON:  04/10/2016 plain film.  CT 04/08/2016. FINDINGS: Lower chest: Clear lung bases. Mild cardiomegaly, without pericardial or pleural effusion. Hepatobiliary: Normal liver. Cholecystectomy, without biliary ductal dilatation. Pancreas: Fatty replaced pancreatic body and head. No duct dilatation or acute inflammation. Spleen: Normal in size, without focal abnormality. Adrenals/Urinary Tract: Normal adrenal glands. Left renal collecting system stones, including a dominant 1.6 cm renal pelvic stone. Lower pole right renal 1.8 cm minimally complex cyst, as evidenced by dependent calcifications. Too small to characterize left renal lesions. No hydronephrosis. Normal urinary bladder. Stomach/Bowel: Normal stomach, without wall thickening. Normal terminal ileum. Again  identified are 2 right abdominopelvic wall ventral hernias. The more cephalad contains nonobstructive large and small bowel including on 45/3. The more caudal contains small bowel with partial obstruction at its exit site on images 64 through 61 of series 3. Proximal fluid-filled small bowel loops enter the hernia at 2.6 cm with a "small bowel feces sign" on 64/3. No evidence of strangulation. No pneumatosis or free  intraperitoneal air. Vascular/Lymphatic: Aortic atherosclerosis. Upper normal sized porta hepatis nodes including at up to 1.4 cm are most likely reactive. Relatively similar to 2017. No pelvic sidewall adenopathy. Reproductive: Mild prostatomegaly. Other: No significant free fluid. 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 Moderate convex left thoracolumbar spine curvature with secondary spondylosis. IMPRESSION: 1. Redemonstration of 2 ventral abdominal wall hernias, containing bowel. The more caudal hernia causes low-grade partial small bowel obstruction at its exit. 2. Left nephrolithiasis 3.  Aortic Atherosclerosis (ICD10-I70.0). Electronically Signed   By: Jeronimo Greaves M.D.   On: 08/19/2021 08:31    Anti-infectives: Anti-infectives (From admission, onward)    Start     Dose/Rate Route Frequency Ordered Stop   08/19/21 1119  ceFAZolin (ANCEF) 3-0.9 GM/100ML-% IVPB       Note to Pharmacy: Phebe Colla N: cabinet override      08/19/21 1119 08/19/21 1342   08/19/21 1115  ceFAZolin (ANCEF) IVPB 3g/100 mL premix        3 g 200 mL/hr over 30 Minutes Intravenous On call to O.R. 08/19/21 1111 08/19/21 1248        Assessment/Plan POD 1, s/p open repair of incarcerated hernia with mesh (underlay), LOA, and JP drain placement for Incisional ventral hernias with low-grade partial SBO by Dr. Magnus Ivan on 2/20 -DC foley today -PT consult, ambulate in halls TID -CLD today -adjust pain meds, doesn't want oxy, wrote for tramadol, scheduled tylenol, ibuprofen, robaxin, prn dilaudid -cont JP Drain -IS and pulm toilet -cont abdominal binder -labs, vitals, progress notes, I/Os reviewed for last 24 hours  FEN - CLD/IVFs VTE - lovenox ID - none needed post op  Moderate Medical Decision Making  LOS: 0 days    Letha Cape , [facility] Surgery 08/20/2021, 10:21 AM Please see Amion for pager number during day hours 7:00am-4:30pm or 7:00am -11:30am on  weekends

## 2021-08-20 NOTE — Progress Notes (Signed)
Patient refuses walking with mobility tech

## 2021-08-21 LAB — CBC
HCT: 37.1 % — ABNORMAL LOW (ref 39.0–52.0)
Hemoglobin: 11.7 g/dL — ABNORMAL LOW (ref 13.0–17.0)
MCH: 27.5 pg (ref 26.0–34.0)
MCHC: 31.5 g/dL (ref 30.0–36.0)
MCV: 87.1 fL (ref 80.0–100.0)
Platelets: 267 10*3/uL (ref 150–400)
RBC: 4.26 MIL/uL (ref 4.22–5.81)
RDW: 14.1 % (ref 11.5–15.5)
WBC: 10.6 10*3/uL — ABNORMAL HIGH (ref 4.0–10.5)
nRBC: 0 % (ref 0.0–0.2)

## 2021-08-21 MED ORDER — METHOCARBAMOL 750 MG PO TABS
750.0000 mg | ORAL_TABLET | Freq: Four times a day (QID) | ORAL | Status: DC
  Administered 2021-08-21 – 2021-08-22 (×4): 750 mg via ORAL
  Filled 2021-08-21 (×4): qty 1

## 2021-08-21 MED ORDER — DOCUSATE SODIUM 100 MG PO CAPS
100.0000 mg | ORAL_CAPSULE | Freq: Two times a day (BID) | ORAL | Status: DC
  Administered 2021-08-21 – 2021-08-23 (×5): 100 mg via ORAL
  Filled 2021-08-21 (×5): qty 1

## 2021-08-21 MED ORDER — PANTOPRAZOLE SODIUM 40 MG PO TBEC
40.0000 mg | DELAYED_RELEASE_TABLET | Freq: Every day | ORAL | Status: DC
Start: 2021-08-21 — End: 2021-08-23
  Administered 2021-08-21 – 2021-08-22 (×2): 40 mg via ORAL
  Filled 2021-08-21 (×2): qty 1

## 2021-08-21 MED ORDER — TRAMADOL HCL 50 MG PO TABS: 50.0000 mg | ORAL_TABLET | Freq: Four times a day (QID) | ORAL | Status: DC | PRN

## 2021-08-21 MED ORDER — POLYETHYLENE GLYCOL 3350 17 G PO PACK: 17.0000 g | PACK | Freq: Every day | ORAL | Status: DC | PRN

## 2021-08-21 MED ORDER — HYDROMORPHONE HCL 1 MG/ML IJ SOLN
0.5000 mg | INTRAMUSCULAR | Status: DC | PRN
  Administered 2021-08-21 – 2021-08-22 (×2): 1 mg via INTRAVENOUS
  Filled 2021-08-21 (×2): qty 1

## 2021-08-21 NOTE — Progress Notes (Signed)
Patient opted not to wear CPAP tonight due to being up and down going to the bathroom.  RT available if patient changes their mind.

## 2021-08-21 NOTE — Progress Notes (Signed)
Patient refused 8am ambulation

## 2021-08-21 NOTE — Evaluation (Signed)
Physical Therapy Evaluation Patient Details Name: Tyrone Fernandez MRN: [id number] DOB: [birth year] Today's Date: 08/21/2021  History of Present Illness  56 y.o. male presents to [facility] hospital on 08/19/2021 with RLQ pain and nausea. CT abdomen reveals 2 ventral abdominal wall hernias containing colon and small bowel, resulting in a partial SBO. Pt underwent open repair of hernia on 2/20. PMH includes hiatal hernia, kidney stones, SBO.  Clinical Impression  Pt presents to PT with deficits in activity tolerance, power, endurance, gait. Pt is able to tolerate household mobility with use of a cane, requiring some assistance to mobilize in bed, although reporting this is his first time sitting at the edge of bed since surgery on 2/20. Pt will benefit from frequent mobilization and ambulation to aide in reducing discomfort and improving gait quality.       Recommendations for follow up therapy are one component of a multi-disciplinary discharge planning process, led by the attending physician.  Recommendations may be updated based on patient status, additional functional criteria and insurance authorization.  Follow Up Recommendations No PT follow up    Assistance Recommended at Discharge Intermittent Supervision/Assistance  Patient can return home with the following  A little help with walking and/or transfers;Help with stairs or ramp for entrance;A little help with bathing/dressing/bathroom;Assistance with cooking/housework    Equipment Recommendations  (TBD, may be able to borrow RW from mother)  Recommendations for Other Services       Functional Status Assessment Patient has had a recent decline in their functional status and demonstrates the ability to make significant improvements in function in a reasonable and predictable amount of time.     Precautions / Restrictions Precautions Precautions: Fall Required Braces or Orthoses:  (abdominal binder) Restrictions Weight Bearing Restrictions:  No      Mobility  Bed Mobility Overal bed mobility: Needs Assistance Bed Mobility: Rolling, Sidelying to Sit Rolling: Supervision Sidelying to sit: Min assist       General bed mobility comments: pull into sitting    Transfers Overall transfer level: Needs assistance Equipment used: Rolling walker (2 wheels) Transfers: Sit to/from Stand Sit to Stand: Min guard                Ambulation/Gait Ambulation/Gait assistance: Supervision Gait Distance (Feet): 300 Feet Assistive device: Rolling walker (2 wheels) Gait Pattern/deviations: Step-through pattern Gait velocity: functional Gait velocity interpretation: 1.31 - 2.62 ft/sec, indicative of limited community ambulator   General Gait Details: pt with slowed step-through gait  Stairs            Wheelchair Mobility    Modified Rankin (Stroke Patients Only)       Balance Overall balance assessment: Needs assistance Sitting-balance support: No upper extremity supported, Feet supported Sitting balance-Leahy Scale: Good     Standing balance support: Single extremity supported, Reliant on assistive device for balance Standing balance-Leahy Scale: Poor                               Pertinent Vitals/Pain Pain Assessment Pain Assessment: 0-10 Pain Score: 6  Pain Location: abdomen Pain Descriptors / Indicators: Sore Pain Intervention(s): RN gave pain meds during session    Home Living Family/patient expects to be discharged to:: Private residence Living Arrangements: Alone Available Help at Discharge: Other (Comment);Available PRN/intermittently (significant other) Type of Home: House Home Access: Stairs to enter Entrance Stairs-Rails: Can reach both Entrance Stairs-Number of Steps: 3   Home Layout: One level  Home Equipment: Agricultural consultant (2 wheels) (may be able to borrow RW from mom)      Prior Function Prior Level of Function : Independent/Modified Independent                      Hand Dominance        Extremity/Trunk Assessment   Upper Extremity Assessment Upper Extremity Assessment: Overall WFL for tasks assessed    Lower Extremity Assessment Lower Extremity Assessment: Overall WFL for tasks assessed    Cervical / Trunk Assessment Cervical / Trunk Assessment: Normal  Communication   Communication: No difficulties  Cognition Arousal/Alertness: Awake/alert Behavior During Therapy: WFL for tasks assessed/performed Overall Cognitive Status: Within Functional Limits for tasks assessed                                          General Comments General comments (skin integrity, edema, etc.): VSS on RA    Exercises     Assessment/Plan    PT Assessment Patient needs continued PT services  PT Problem List Decreased activity tolerance;Decreased balance;Decreased mobility;Decreased strength;Decreased knowledge of use of DME;Pain       PT Treatment Interventions DME instruction;Gait training;Stair training;Functional mobility training;Therapeutic activities;Therapeutic exercise;Balance training;Patient/family education    PT Goals (Current goals can be found in the Care Plan section)  Acute Rehab PT Goals Patient Stated Goal: to return to independence and reduce pain PT Goal Formulation: With patient Time For Goal Achievement: 09/04/21 Potential to Achieve Goals: Good    Frequency Min 3X/week     Co-evaluation               AM-PAC PT "6 Clicks" Mobility  Outcome Measure Help needed turning from your back to your side while in a flat bed without using bedrails?: A Little Help needed moving from lying on your back to sitting on the side of a flat bed without using bedrails?: A Little Help needed moving to and from a bed to a chair (including a wheelchair)?: A Little Help needed standing up from a chair using your arms (e.g., wheelchair or bedside chair)?: A Little Help needed to walk in hospital room?: A Little Help  needed climbing 3-5 steps with a railing? : A Little 6 Click Score: 18    End of Session   Activity Tolerance: Patient tolerated treatment well Patient left: in chair;with call bell/phone within reach Nurse Communication: Mobility status PT Visit Diagnosis: Other abnormalities of gait and mobility (R26.89)    Time: 9678-9381 PT Time Calculation (min) (ACUTE ONLY): 33 min   Charges:   PT Evaluation $PT Eval Low Complexity: 1 Low          Arlyss Gandy, PT, DPT Acute Rehabilitation Pager: [phone] Office [phone]   Arlyss Gandy 08/21/2021, 12:18 PM

## 2021-08-21 NOTE — Progress Notes (Signed)
Mobility Specialist Progress Note:   08/21/21 1655  Mobility  Activity Ambulated with assistance in hallway  Level of Assistance Standby assist, set-up cues, supervision of patient - no hands on  Assistive Device Front wheel walker  Distance Ambulated (ft) 320 ft  Activity Response Tolerated well  $Mobility charge 1 Mobility   Pt received in bed willing to participate in mobility. Complaints of 6/10 abdominal pain. Left pt in bed with call bell in reach and all needs met.   [facility] Public librarian Phone [phone]

## 2021-08-21 NOTE — Progress Notes (Signed)
2 Days Post-Op  Subjective: CC: Patient reports soreness around his incisions and on the sides of his abdomen that is well controlled on pain medications. Wearing abdominal binder. Tolerating cld. Passing flatus. No BM. Denies n/v. Did get zofran yesterday but this appears to be around the time of his dilaudid dosing. He said the zofran was more of a preventative measures and he was never nauseated but dilaudid has made him nauseated in the past. Refused doses of tylenol and ibuprofen yesterday. Did not use Robaxin. Tried ultram x 1, otherwise has been using dilaudid for pain. Agreeable to try tylenol, ibuprofen, robaxin and ultram as first time for pain today with dilaudid only for breakthrough pain. He did not get out of bed yesterday. Agreeable to mobilize today. Foley removed yesterday. Voiding without difficulty this morning.   Objective: Vital signs in last 24 hours: Temp:  [98.2 F (36.8 C)-98.6 F (37 C)] 98.3 F (36.8 C) (02/22 0834) Pulse Rate:  [82-97] 82 (02/22 0834) Resp:  [18-20] 18 (02/22 0834) BP: (107-125)/(58-75) 119/67 (02/22 0834) SpO2:  [94 %-99 %] 96 % (02/22 0834) Last BM Date : 08/18/21  Intake/Output from previous day: 02/21 0701 - 02/22 0700 In: 697.1 [I.V.:697.1] Out: 550 [Urine:500; Drains:50] Intake/Output this shift: No intake/output data recorded.  PE: Gen:  Alert, NAD, pleasant Card:  Reg Pulm:  CTAB, no W/R/R, effort normal Abd: Soft, obese, does not appear to be overtly distended and is appropriately tender. +BS. JP w/ old bloody output. Midline wound with honeycomb dressing in place that is c/d/I. Ext:  No LE edema Psych: A&Ox3   Lab Results:  Recent Labs    08/20/21 0237 08/21/21 0246  WBC 12.9* 10.6*  HGB 14.0 11.7*  HCT 43.2 37.1*  PLT 322 267   BMET Recent Labs    08/19/21 0703 08/20/21 0237  NA 136 135  K 3.7 4.5  CL 102 103  CO2 23 24  GLUCOSE 138* 146*  BUN 15 13  CREATININE 0.89 0.78  CALCIUM 9.5 9.1   PT/INR No  results for input(s): LABPROT, INR in the last 72 hours. CMP     Component Value Date/Time   NA 135 08/20/2021 0237   K 4.5 08/20/2021 0237   CL 103 08/20/2021 0237   CO2 24 08/20/2021 0237   GLUCOSE 146 (H) 08/20/2021 0237   BUN 13 08/20/2021 0237   CREATININE 0.78 08/20/2021 0237   CREATININE 0.82 09/27/2014 1206   CALCIUM 9.1 08/20/2021 0237   PROT 8.3 (H) 08/19/2021 0703   ALBUMIN 3.8 08/19/2021 0703   AST 28 08/19/2021 0703   ALT 27 08/19/2021 0703   ALKPHOS 81 08/19/2021 0703   BILITOT 0.4 08/19/2021 0703   GFRNONAA >60 08/20/2021 0237   GFRNONAA >89 09/27/2014 1206   GFRAA >60 04/10/2016 0507   GFRAA >89 09/27/2014 1206   Lipase     Component Value Date/Time   LIPASE 29 08/19/2021 0703    Studies/Results: No results found.  Anti-infectives: Anti-infectives (From admission, onward)    Start     Dose/Rate Route Frequency Ordered Stop   08/19/21 1119  ceFAZolin (ANCEF) 3-0.9 GM/100ML-% IVPB       Note to Pharmacy: Phebe Colla N: cabinet override      08/19/21 1119 08/19/21 1342   08/19/21 1115  ceFAZolin (ANCEF) IVPB 3g/100 mL premix        3 g 200 mL/hr over 30 Minutes Intravenous On call to O.R. 08/19/21 1111 08/19/21 1248  Assessment/Plan POD 2, s/p open repair of incarcerated hernia with mesh (underlay), LOA, and JP drain placement for Incisional ventral hernias with low-grade partial SBO by Dr. Magnus Ivan on 2/20 - Adv diet - Wean IV pain meds. Scheduled Tylenol, Ibuprofen, Robaxin. PRN Ultram. Doesn't want oxy  - PT consult, ambulate in halls TID. Agreeable to mobilize today - Cont JP Drain - IS and pulm toilet - Cont abdominal binder - Repeat labs in AM. Hgb 11.7 this am.    FEN - FLD, ADAT, start bowel regimen, SLIV VTE - SCDs, Lovenox ID - none needed post op Foley - out POD1, voiding    Labs, vitals, progress notes, I/Os reviewed for last 24 hours This care is post op    LOS: 1 day    Tyrone Fernandez , [facility]  Surgery 08/21/2021, 8:57 AM Please see Amion for pager number during day hours 7:00am-4:30pm

## 2021-08-22 LAB — BASIC METABOLIC PANEL
Anion gap: 6 (ref 5–15)
BUN: 8 mg/dL (ref 6–20)
CO2: 28 mmol/L (ref 22–32)
Calcium: 8.6 mg/dL — ABNORMAL LOW (ref 8.9–10.3)
Chloride: 104 mmol/L (ref 98–111)
Creatinine, Ser: 0.75 mg/dL (ref 0.61–1.24)
GFR, Estimated: >60 mL/min (ref >60)
Glucose, Bld: 98 mg/dL (ref 70–99)
Potassium: 3.9 mmol/L (ref 3.5–5.1)
Sodium: 138 mmol/L (ref 135–145)

## 2021-08-22 LAB — CBC
HCT: 36.4 % — ABNORMAL LOW (ref 39.0–52.0)
Hemoglobin: 11.6 g/dL — ABNORMAL LOW (ref 13.0–17.0)
MCH: 27.5 pg (ref 26.0–34.0)
MCHC: 31.9 g/dL (ref 30.0–36.0)
MCV: 86.3 fL (ref 80.0–100.0)
Platelets: 273 10*3/uL (ref 150–400)
RBC: 4.22 MIL/uL (ref 4.22–5.81)
RDW: 13.7 % (ref 11.5–15.5)
WBC: 8.4 10*3/uL (ref 4.0–10.5)
nRBC: 0 % (ref 0.0–0.2)

## 2021-08-22 MED ORDER — METHOCARBAMOL 500 MG PO TABS
1000.0000 mg | ORAL_TABLET | Freq: Four times a day (QID) | ORAL | Status: DC
  Administered 2021-08-22 – 2021-08-23 (×5): 1000 mg via ORAL
  Filled 2021-08-22 (×5): qty 2

## 2021-08-22 MED ORDER — HYDROMORPHONE HCL 1 MG/ML IJ SOLN: 0.5000 mg | INTRAMUSCULAR | Status: DC | PRN

## 2021-08-22 NOTE — Progress Notes (Signed)
Mobility Specialist Progress Note:   08/22/21 1120  Mobility  Activity Ambulated with assistance in hallway  Level of Assistance Standby assist, set-up cues, supervision of patient - no hands on  Assistive Device Front wheel walker  Distance Ambulated (ft) 350 ft  Activity Response Tolerated well  $Mobility charge 1 Mobility   Pt received in bed willing to participate in mobility. Complaints of 5/10 abdominal pain. Pt needing to use BR when returning from walk. Left in bathroom and was instructed to pull string wen complete, NT notified.   [facility] Designer, jewellery Phone [phone]

## 2021-08-22 NOTE — Progress Notes (Signed)
Progress Note  3 Days Post-Op  Subjective: Pt reports pain control is improved since yesterday, only took 3 doses IV pain meds yesterday and has taken 1 dose this AM. Discussed trial of tramadol today instead. He had a BM overnight. He plans to stay with his girlfriend upon discharge and she is coming later today.   Objective: Vital signs in last 24 hours: Temp:  [97.6 F (36.4 C)-98.3 F (36.8 C)] 97.6 F (36.4 C) (02/23 0810) Pulse Rate:  [82-102] 84 (02/23 0810) Resp:  [16-19] 19 (02/23 0810) BP: (114-129)/(65-74) 125/72 (02/23 0810) SpO2:  [96 %-99 %] 99 % (02/23 0810) Last BM Date : 08/22/21  Intake/Output from previous day: 02/22 0701 - 02/23 0700 In: 480 [P.O.:480] Out: 1585 [Urine:1525; Drains:60] Intake/Output this shift: No intake/output data recorded.  PE: Gen:  Alert, NAD, pleasant Card:  Reg Pulm: effort normal Abd: Soft, obese, does not appear to be overtly distended and is appropriately tender. +BS. JP w/ old bloody output. Midline wound with honeycomb dressing in place that is c/d/I. Ext:  No LE edema Psych: A&Ox3    Lab Results:  Recent Labs    08/21/21 0246 08/22/21 0504  WBC 10.6* 8.4  HGB 11.7* 11.6*  HCT 37.1* 36.4*  PLT 267 273   BMET Recent Labs    08/20/21 0237 08/22/21 0504  NA 135 138  K 4.5 3.9  CL 103 104  CO2 24 28  GLUCOSE 146* 98  BUN 13 8  CREATININE 0.78 0.75  CALCIUM 9.1 8.6*   PT/INR No results for input(s): LABPROT, INR in the last 72 hours. CMP     Component Value Date/Time   NA 138 08/22/2021 0504   K 3.9 08/22/2021 0504   CL 104 08/22/2021 0504   CO2 28 08/22/2021 0504   GLUCOSE 98 08/22/2021 0504   BUN 8 08/22/2021 0504   CREATININE 0.75 08/22/2021 0504   CREATININE 0.82 09/27/2014 1206   CALCIUM 8.6 (L) 08/22/2021 0504   PROT 8.3 (H) 08/19/2021 0703   ALBUMIN 3.8 08/19/2021 0703   AST 28 08/19/2021 0703   ALT 27 08/19/2021 0703   ALKPHOS 81 08/19/2021 0703   BILITOT 0.4 08/19/2021 0703    GFRNONAA >60 08/22/2021 0504   GFRNONAA >89 09/27/2014 1206   GFRAA >60 04/10/2016 0507   GFRAA >89 09/27/2014 1206   Lipase     Component Value Date/Time   LIPASE 29 08/19/2021 0703       Studies/Results: No results found.  Anti-infectives: Anti-infectives (From admission, onward)    Start     Dose/Rate Route Frequency Ordered Stop   08/19/21 1119  ceFAZolin (ANCEF) 3-0.9 GM/100ML-% IVPB       Note to Pharmacy: Phebe Colla N: cabinet override      08/19/21 1119 08/19/21 1342   08/19/21 1115  ceFAZolin (ANCEF) IVPB 3g/100 mL premix        3 g 200 mL/hr over 30 Minutes Intravenous On call to O.R. 08/19/21 1111 08/19/21 1248        Assessment/Plan POD 3, s/p open repair of incarcerated hernia with mesh (underlay), LOA, and JP drain placement for Incisional ventral hernias with low-grade partial SBO by Dr. Magnus Ivan on 2/20 - Adv diet - Wean IV pain meds. Scheduled Tylenol, Ibuprofen, Robaxin (increased this today). PRN Ultram. Doesn't want oxy  - continue to mobilize - Cont JP Drain - IS and pulm toilet - Cont abdominal binder - hgb stable at 11.6  - anticipate discharge home tomorrow  if tolerating diet and pain well controlled    FEN - reg diet, SLIV VTE - SCDs, Lovenox ID - none needed post op Foley - out POD1, voiding   LOS: 2 days   I reviewed nursing notes, last 24 h vitals and pain scores, last 48 h intake and output, and last 24 h labs and trends.  This care required low level of medical decision making.    Juliet Rude, [facility] 08/22/2021, 9:37 AM Please see Amion for pager number during day hours 7:00am-4:30pm

## 2021-08-23 ENCOUNTER — Other Ambulatory Visit (HOSPITAL_COMMUNITY): Payer: Self-pay

## 2021-08-23 MED ORDER — ACETAMINOPHEN 500 MG PO TABS: 1000.0000 mg | ORAL_TABLET | Freq: Four times a day (QID) | ORAL | Status: DC | PRN

## 2021-08-23 MED ORDER — TRAMADOL HCL 50 MG PO TABS
50.0000 mg | ORAL_TABLET | Freq: Four times a day (QID) | ORAL | 0 refills | Status: DC | PRN
  Filled 2021-08-23: qty 15, 2d supply, fill #0

## 2021-08-23 MED ORDER — IBUPROFEN 600 MG PO TABS
600.0000 mg | ORAL_TABLET | Freq: Four times a day (QID) | ORAL | 0 refills | Status: DC | PRN
  Filled 2021-08-23: qty 30, 8d supply, fill #0

## 2021-08-23 MED ORDER — METHOCARBAMOL 500 MG PO TABS
1000.0000 mg | ORAL_TABLET | Freq: Four times a day (QID) | ORAL | 0 refills | Status: DC | PRN
  Filled 2021-08-23: qty 30, 4d supply, fill #0

## 2021-08-23 MED ORDER — ONDANSETRON 4 MG PO TBDP
4.0000 mg | ORAL_TABLET | Freq: Four times a day (QID) | ORAL | 0 refills | Status: DC | PRN
  Filled 2021-08-23: qty 20, 5d supply, fill #0

## 2021-08-23 NOTE — Progress Notes (Signed)
Mobility Specialist: Progress Note   08/23/21 1041  Mobility  Activity Ambulated with assistance in hallway  Level of Assistance Minimal assist, patient does 75% or more  Assistive Device Front wheel walker  Distance Ambulated (ft) 260 ft  Activity Response Tolerated well  $Mobility charge 1 Mobility   Pt received in bed and agreeable to mobility. Required minA to sit EOB and was mod I throughout ambulation. C/o some abdominal discomfort during session, otherwise asymptomatic. Pt back to bed after walk with call bell and phone at his side.   [facility] Mobility Specialist Mobility Specialist 5 [place]: [phone] Mobility Specialist 6 [address]: [phone]

## 2021-08-23 NOTE — Discharge Summary (Signed)
[facility] Discharge Summary   Patient ID: ALAKI ROSEL MRN: [id number] DOB/AGE: 57-Jan-1967 57 y.o.  Admit date: 08/19/2021 Discharge date: 08/23/2021  Admitting Diagnosis: Ventral hernia with bowel obstruction   Discharge Diagnosis Same as above  Consultants None   Imaging: No results found.  Procedures Dr. Coralie Keens (08/19/21) - Open ventral hernia repair with mesh  Hospital Course:  Patient is a 56 year old male who presented to the ED with known hernia and increased abdominal pain.  Workup showed incarceration ventral hernia with SBO.  Patient was admitted and underwent procedure listed above.  Tolerated procedure well and was transferred to the floor.  Diet was advanced as tolerated.  On POD4, the patient was voiding well, tolerating diet, ambulating well, pain well controlled, vital signs stable, incisions c/d/i and felt stable for discharge home.  Patient will follow up in our office in 1-2 weeks and knows to call with questions or concerns.  He will call to confirm appointment date/time.    Physical Exam: General:  Alert, NAD, pleasant, comfortable Abd:  Soft, ND, mild tenderness, incision C/D/I, drain with minimal sanguinous drainage     I or a member of my team have reviewed this patient in the Controlled Substance Database.   Allergies as of 08/23/2021   No Known Allergies      Medication List     STOP taking these medications    naproxen 500 MG tablet Commonly known as: NAPROSYN       TAKE these medications    acetaminophen 500 MG tablet Commonly known as: TYLENOL Take 2 tablets (1,000 mg total) by mouth every 6 (six) hours as needed for mild pain or fever.   calcium carbonate 500 MG chewable tablet Commonly known as: TUMS - dosed in mg elemental calcium Chew 4 tablets by mouth daily as needed for indigestion or heartburn.   ibuprofen 600 MG tablet Commonly known as: ADVIL Take 1 tablet (600 mg total) by mouth every 6 (six)  hours as needed for moderate pain. What changed:  medication strength reasons to take this   levocetirizine 5 MG tablet Commonly known as: XYZAL Take 5 mg by mouth daily as needed for allergies.   LORazepam 1 MG tablet Commonly known as: Ativan Take 1 tablet (1 mg total) by mouth 3 (three) times daily as needed for anxiety.   methocarbamol 500 MG tablet Commonly known as: ROBAXIN Take 2 tablets (1,000 mg total) by mouth every 6 (six) hours as needed for muscle spasms.   ondansetron 4 MG disintegrating tablet Commonly known as: ZOFRAN-ODT Take 1 tablet (4 mg total) by mouth every 6 (six) hours as needed for nausea.   traMADol 50 MG tablet Commonly known as: ULTRAM Take 1-2 tablets (50-100 mg total) by mouth every 6 (six) hours as needed for severe pain or moderate pain.               Durable Medical Equipment  (From admission, onward)           Start     Ordered   08/23/21 0916  For home use only DME Walker rolling  Once       Question Answer Comment  Walker: With 5 Inch Wheels   Patient needs a walker to treat with the following condition S/P hernia repair      08/23/21 0916              Follow-up Information     Kerin Perna, NP Follow  up.   Specialty: Internal Medicine Why: follow up appointment September 10, 2021 at 2:30 pm Contact information: [address] [place] [address] [phone]         [facility], [place]. Go on 08/30/2021.   Specialty: General Surgery Why: 10 AM. Please arrive 30 min prior to appointment time. For staple and drain removal. This will be a nurse visit only. Contact information: [place] [address] [phone]         Coralie Keens, MD. Go on 09/17/2021.   Specialty: General Surgery Why: 9:20 AM. Please arrive 15 min prior to appointment time. Contact information: [address] [place] [address] [phone]                  Signed: Norm Parcel , [facility] 08/23/2021, 9:41 AM Please see Amion for pager number during day hours 7:00am-4:30pm

## 2021-08-23 NOTE — Plan of Care (Signed)

## 2021-09-10 ENCOUNTER — Ambulatory Visit (INDEPENDENT_AMBULATORY_CARE_PROVIDER_SITE_OTHER): Payer: Self-pay | Admitting: Primary Care

## 2021-09-10 ENCOUNTER — Other Ambulatory Visit: Payer: Self-pay

## 2021-09-10 ENCOUNTER — Encounter (INDEPENDENT_AMBULATORY_CARE_PROVIDER_SITE_OTHER): Payer: Self-pay | Admitting: Primary Care

## 2021-09-10 VITALS — BP 137/89 | HR 92 | Temp 97.8°F | Ht 70.0 in | Wt 288.2 lb

## 2021-09-10 DIAGNOSIS — Z131 Encounter for screening for diabetes mellitus: Secondary | ICD-10-CM

## 2021-09-10 DIAGNOSIS — Z09 Encounter for follow-up examination after completed treatment for conditions other than malignant neoplasm: Secondary | ICD-10-CM

## 2021-09-10 DIAGNOSIS — Z7689 Persons encountering health services in other specified circumstances: Secondary | ICD-10-CM

## 2021-09-10 LAB — POCT GLYCOSYLATED HEMOGLOBIN (HGB A1C): Hemoglobin A1C: 5.4 % (ref 4.0–5.6)

## 2021-09-10 NOTE — Progress Notes (Signed)
?[facility] ? ? ?Subjective:  ? [name] is a 56 y.o. male presents for hospital follow up and establish care. He awoke from sleep secondary to RLQ abdominal pain presented .  He developed some nausea, but could only dry heave. Admit to the hospital was 08/19/21, patient was discharged from the hospital on 08/23/21, He admitted for: colectomy/colostomy, colostomy takedown, ex lap for SBO with subsequent cholecystectomy and primary ventral hernia repair in the 2000s by Dr. Zachery Dakins. Today he admits to feeling better taking slow but steady steps to recovery. Denies shortness of breath, headaches, chest pain or lower extremity edema. He does have abdominal and groin pain -has a lot of mesh in him with a long scar the lower part of abdomen. Expected after this surger.   ? ?Past Medical History:  ?Diagnosis Date  ? H/O hiatal hernia   ? History of kidney stones   ? SBO (small bowel obstruction) (HCC) 03/2016  ?  ? ?No Known Allergies ? ?  ?Current Outpatient Medications on File Prior to Visit  ?Medication Sig Dispense Refill  ? ibuprofen (ADVIL) 600 MG tablet Take 1 tablet (600 mg total) by mouth every 6 (six) hours as needed for moderate pain. 30 tablet 0  ? levocetirizine (XYZAL) 5 MG tablet Take 5 mg by mouth daily as needed for allergies.    ? acetaminophen (TYLENOL) 500 MG tablet Take 2 tablets (1,000 mg total) by mouth every 6 (six) hours as needed for mild pain or fever.    ? LORazepam (ATIVAN) 1 MG tablet Take 1 tablet (1 mg total) by mouth 3 (three) times daily as needed for anxiety. (Patient not taking: Reported on 09/10/2021) 10 tablet 0  ? methocarbamol (ROBAXIN) 500 MG tablet Take 2 tablets (1,000 mg total) by mouth every 6 (six) hours as needed for muscle spasms. (Patient not taking: Reported on 09/10/2021) 30 tablet 0  ? traMADol (ULTRAM) 50 MG tablet Take 1-2 tablets (50-100 mg total) by mouth every 6 (six) hours as needed for severe pain or moderate pain. (Patient not taking:  Reported on 09/10/2021) 15 tablet 0  ? [DISCONTINUED] albuterol (PROVENTIL HFA;VENTOLIN HFA) 108 (90 BASE) MCG/ACT inhaler Inhale 1-2 puffs into the lungs every 6 (six) hours as needed for wheezing or shortness of breath. (Patient not taking: Reported on 04/08/2016) 1 Inhaler 0  ? [DISCONTINUED] fluticasone (FLONASE) 50 MCG/ACT nasal spray Place 2 sprays into both nostrils daily. (Patient not taking: Reported on 04/08/2016) 16 g 0  ? [DISCONTINUED] omeprazole (PRILOSEC) 10 MG capsule Take 1 capsule (10 mg total) by mouth daily. 30 capsule 2  ? ?No current facility-administered medications on file prior to visit.  ? ? ? ?Review of System: ?ROS ? ?Objective:  ?BP 137/89 (BP Location: Right Arm, Patient Position: Sitting, Cuff Size: Large)   Pulse 92   Temp 97.8 ?F (36.6 ?C) (Oral)   Ht 5\' 10"  (1.778 m)   Wt 288 lb 3.2 oz (130.7 kg)   SpO2 95%   BMI 41.35 kg/m?  ? Weights  ? 09/10/21 1444  ?Weight: 288 lb 3.2 oz (130.7 kg)  ? ? ?Physical Exam: ? ? ?General Appearance: Well nourished, in no apparent distress. ?Eyes: PERRLA, EOMs, conjunctiva no swelling or erythema ?Sinuses: No Frontal/maxillary tenderness ?ENT/Mouth: Ext aud canals clear, TMs without erythema, bulging.  Hearing normal.  ?Neck: Supple, thyroid normal.  ?Respiratory: Respiratory effort normal, BS equal bilaterally without rales, rhonchi, wheezing or stridor.  ?Cardio: RRR with no MRGs. Brisk peripheral  pulses without edema.  ?Abdomen: defer has a abdominal band on  ?Lymphatics: Non tender without lymphadenopathy.  ?Musculoskeletal: Full ROM, 5/5 strength, normal gait.  ?Skin: Warm, dry without rashes, lesions, ecchymosis.  ?Neuro: Cranial nerves intact. Normal muscle tone, no cerebellar symptoms. Sensation intact.  ?Psych: Awake and oriented X 3, normal affect, Insight and Judgment appropriate.  ? ? ?Assessment:  ?Tyrone Fernandez was seen today for hospitalization follow-up. ? ?Diagnoses and all orders for this visit: ? ?Screening for diabetes  mellitus ?-     HgB A1c 5.4  ? ?Hospital discharge follow-up ?Retrieved from hospital discharge  ?Follow up with Grayce Sessions, NP (Internal Medicine); follow up appointment September 10, 2021 at 2:30 pm - completed  ?Go to [facility], [facility] (General Surgery) on 08/30/2021; 10 AM. Please arrive 30 min prior to appointment time. For staple and drain removal. This will be a nurse visit only.- completed  ?Go to Abigail Miyamoto, MD (General Surgery) on 09/17/2021; 9:20 AM. Please arrive 15 min prior to appointment time. ? ?Encounter to establish care ?Establish care with PCP ? ? Morbid obesity (HCC)  ?He has lost 60 lbs since 6/22 and after recovery he will start back exercising  ?Discussed diet and exercise for person with BMI >25. Instructed: You must burn more calories than you eat. Losing 5 percent of your body weight should be considered a success. In the longer term, losing more than 15 percent of your body weight and staying at this weight is an extremely good result. However, keep in mind that even losing 5 percent of your body weight leads to important health benefits, so try not to get discouraged if you're not able to lose more than this. ?Will recheck weight in 3-6 months.  ? ? ?This note has been created with Education officer, environmental. Any transcriptional errors are unintentional.  ? ?Grayce Sessions, NP ?09/10/2021, 3:11 PM ?  ? ?

## 2022-07-17 ENCOUNTER — Ambulatory Visit
Admission: RE | Admit: 2022-07-17 | Discharge: 2022-07-17 | Disposition: A | Discharge status: Home / Self Care | Payer: Medicaid Other | Source: Ambulatory Visit | Attending: Physician Assistant | Admitting: Physician Assistant

## 2022-07-17 ENCOUNTER — Other Ambulatory Visit: Payer: Self-pay | Admitting: Physician Assistant

## 2022-07-17 DIAGNOSIS — M7989 Other specified soft tissue disorders: Secondary | ICD-10-CM

## 2022-07-17 DIAGNOSIS — R0602 Shortness of breath: Secondary | ICD-10-CM

## 2022-07-25 ENCOUNTER — Other Ambulatory Visit (HOSPITAL_BASED_OUTPATIENT_CLINIC_OR_DEPARTMENT_OTHER): Payer: Self-pay

## 2022-07-25 DIAGNOSIS — R0681 Apnea, not elsewhere classified: Secondary | ICD-10-CM

## 2022-07-25 DIAGNOSIS — R0683 Snoring: Secondary | ICD-10-CM

## 2022-08-08 ENCOUNTER — Encounter: Payer: Self-pay | Admitting: Physician Assistant

## 2022-08-12 ENCOUNTER — Other Ambulatory Visit (HOSPITAL_BASED_OUTPATIENT_CLINIC_OR_DEPARTMENT_OTHER): Payer: Self-pay

## 2022-08-12 DIAGNOSIS — R0681 Apnea, not elsewhere classified: Secondary | ICD-10-CM

## 2022-08-13 ENCOUNTER — Ambulatory Visit (INDEPENDENT_AMBULATORY_CARE_PROVIDER_SITE_OTHER): Payer: Medicaid Other

## 2022-08-13 ENCOUNTER — Ambulatory Visit (INDEPENDENT_AMBULATORY_CARE_PROVIDER_SITE_OTHER): Payer: Medicaid Other | Admitting: Physical Medicine and Rehabilitation

## 2022-08-13 ENCOUNTER — Ambulatory Visit: Payer: Medicaid Other | Admitting: Physician Assistant

## 2022-08-13 ENCOUNTER — Encounter: Payer: Self-pay | Admitting: Physical Medicine and Rehabilitation

## 2022-08-13 DIAGNOSIS — G8929 Other chronic pain: Secondary | ICD-10-CM | POA: Diagnosis not present

## 2022-08-13 DIAGNOSIS — M47816 Spondylosis without myelopathy or radiculopathy, lumbar region: Secondary | ICD-10-CM | POA: Diagnosis not present

## 2022-08-13 DIAGNOSIS — M419 Scoliosis, unspecified: Secondary | ICD-10-CM | POA: Diagnosis not present

## 2022-08-13 DIAGNOSIS — M545 Low back pain, unspecified: Secondary | ICD-10-CM

## 2022-08-13 MED ORDER — MELOXICAM 15 MG PO TABS: 15.0000 mg | ORAL_TABLET | Freq: Every day | ORAL | 0 refills | Status: DC

## 2022-08-13 NOTE — Progress Notes (Signed)
Tyrone Fernandez - 57 y.o. male MRN [id number]  Date of birth: 1966-03-27  Office Visit Note: Visit Date: 08/13/2022 PCP: Jorge Ny, PA-C Referred by: Jorge Ny, PA-C  Subjective: Chief Complaint  Patient presents with   Lower Back - Pain   HPI: Tyrone Fernandez is a 57 y.o. male who comes in today per the request of Jorge Ny, [place] for evaluation of chronic, worsening and severe left sided lower back. Pain ongoing for several years and worsens with standing. States he was told many years ago he had "curvature" in his spine. He reports severe pain with household chores such as washing dishes. He describes pain as a sore, aching and grabbing sensation, currently rate as 8 out of 10. Some relief of pain with home exercise regimen, rest and use of medications. He has tried over the counter medications with no relief of pain. No history of physical therapy treatments. CT of abdomen and pelvis from 2023 does exhibit moderate convex left thoracolumbar spine curvature. No history of lumbar surgery/injections. Patient previously employed as Building control surveyor and states he feels this physically demanding job caused worsening back problems. He is currently unemployed as he feels he is not able to work due to pain.  Patient denies focal weakness, numbness and tingling.  No recent trauma or falls.     Oswestry Disability Index Score 24% 10 to 20 (40%) moderate disability: The patient experiences more pain and difficulty with sitting, lifting and standing. Travel and social life are more difficult, and they may be disabled from work. Personal care, sexual activity and sleeping are not grossly affected, and the patient can usually be managed by conservative means.  Review of Systems  Musculoskeletal:  Positive for back pain.  Neurological:  Negative for tingling, sensory change, focal weakness and weakness.  All other systems reviewed and are negative.  Otherwise per HPI.  Assessment & Plan: Visit  Diagnoses:    ICD-10-CM   1. Chronic left-sided low back pain without sciatica  M54.50 XR Lumbar Spine 2-3 Views   G89.29 MR LUMBAR SPINE WO CONTRAST    2. Spondylosis without myelopathy or radiculopathy, lumbar region  M47.816     3. Scoliosis of thoracolumbar spine, unspecified scoliosis type  M41.9        Plan: Findings:  Chronic, worsening and severe left sided lower back pain. No radicular symptoms noted. Patient continues to have severe pain despite good conservative therapies such as home exercise regimen, rest and use of medications. Patients clinical presentation and exam are consistent with facet mediated pain.  We did obtain 2 view lumbar x-rays in the office today that exhibit left thoracolumbar scoliosis and degenerative changes to lower lumbar spine. Next step is to obtain lumbar MRI imaging.  Depending on results of MRI imaging we did discuss the possibility of performing lumbar injection, would consider possible facet joint injections vs epidural steroid injection. I also discussed medication management with patient and placed prescription for Meloxicam.  Formal physical therapy would be helpful as I do feel he would benefit from core strengthening exercises. We will have patient follow up after MRI to review and discuss treatment options. No red flag symptoms noted upon exam today.     Meds & Orders:  Meds ordered this encounter  Medications   meloxicam (MOBIC) 15 MG tablet    Sig: Take 1 tablet (15 mg total) by mouth daily.    Dispense:  30 tablet    Refill:  0    Orders  Placed This Encounter  Procedures   XR Lumbar Spine 2-3 Views   MR LUMBAR SPINE WO CONTRAST    Follow-up: Return for follow up for lumbar MRI review.   Procedures: No procedures performed      Clinical History: No specialty comments available.   He reports that he has never smoked. He has never used smokeless tobacco.  Recent Labs    09/10/21 1455  HGBA1C 5.4    Objective:  VS:  HT:     WT:   BMI:     BP:   HR: bpm  TEMP: ( )  RESP:  Physical Exam Vitals and nursing note reviewed.  HENT:     Head: Normocephalic and atraumatic.     Right Ear: External ear normal.     Left Ear: External ear normal.     Nose: Nose normal.     Mouth/Throat:     Mouth: Mucous membranes are moist.  Eyes:     Extraocular Movements: Extraocular movements intact.  Cardiovascular:     Rate and Rhythm: Normal rate.     Pulses: Normal pulses.  Pulmonary:     Effort: Pulmonary effort is normal.  Abdominal:     General: Abdomen is flat. There is no distension.  Musculoskeletal:        General: Tenderness present.     Cervical back: Normal range of motion.     Comments: Pt rises from seated position to standing without difficulty. Concordant low back pain with facet loading, lumbar spine extension and rotation. Strong distal strength without clonus, no pain upon palpation of greater trochanters. Sensation intact bilaterally. Walks independently, gait steady.   Skin:    General: Skin is warm and dry.     Capillary Refill: Capillary refill takes less than 2 seconds.  Neurological:     General: No focal deficit present.     Mental Status: He is alert and oriented to person, place, and time.  Psychiatric:        Mood and Affect: Mood normal.        Behavior: Behavior normal.     Ortho Exam  Imaging: No results found.  Past Medical/Family/Surgical/Social History: Medications & Allergies reviewed per EMR, new medications updated. Patient Active Problem List   Diagnosis Date Noted   Ventral hernia with bowel obstruction 08/19/2021   Hernia of anterior abdominal wall    SBO (small bowel obstruction) ([facility]) 04/08/2016   History of bronchitis 09/27/2014   Cough 09/27/2014   Obesity 09/27/2014   Past Medical History:  Diagnosis Date   H/O hiatal hernia    History of kidney stones    SBO (small bowel obstruction) ([facility]) 03/2016   Family History  Problem Relation Age of Onset    Diabetes Mother    Cancer Mother        breast cancer    Stroke Maternal Aunt    Heart disease Paternal Grandfather    Past Surgical History:  Procedure Laterality Date   1984  KNEE SURGERY     2007 OR 2008  KIDNEY STONE REMOVED     APPENDECTOMY  2003   DONE AT TIME OF COLON RESECTION   CHOLECYSTECTOMY  2010   AT SAME TIME OF HERNIA REPAIR   COLON SURGERY  2003   COLON RESECTION FOR DIVERTICULITIS / COLOSTOMY   COLOSTOMY REVERSAL  2003   CYSTOSCOPY WITH URETEROSCOPY AND STENT PLACEMENT Right 05/03/2013   Procedure: CYSTOSCOPY, RIGHT RETROGRADE PYELOGRAMRIGHT URETEROSCOPY, STONE EXTRACTION  AND STENT PLACEMENT;  Surgeon: Fredricka Bonine, MD;  Location: [facility];  Service: Urology;  Laterality: Right;   HERNIA REPAIR  2010   VENTRAL HERNIA REPAIR N/A 08/19/2021   Procedure: OPEN HERNIA REPAIR VENTRAL ADULT;  Surgeon: Coralie Keens, MD;  Location: [facility];  Service: General;  Laterality: N/A;   Social History   Occupational History   Not on file  Tobacco Use   Smoking status: Never   Smokeless tobacco: Never  Substance and Sexual Activity   Alcohol use: No   Drug use: No   Sexual activity: Not on file

## 2022-08-13 NOTE — Progress Notes (Signed)
Functional Pain Scale - descriptive words and definitions  Moderate (4)   Constantly aware of pain, can complete ADLs with modification/sleep marginally affected at times/passive distraction is of no use, but active distraction gives some relief. Moderate range order  Average Pain  5, but sleep is not affected  Lower back pain that radiates into the left testicle. Standing makes pain worse, at times sitting for long period of time makes pain worse

## 2022-08-31 ENCOUNTER — Ambulatory Visit
Admission: RE | Admit: 2022-08-31 | Discharge: 2022-08-31 | Disposition: A | Discharge status: Home / Self Care | Payer: Medicaid Other | Source: Ambulatory Visit | Attending: Physical Medicine and Rehabilitation | Admitting: Physical Medicine and Rehabilitation

## 2022-08-31 DIAGNOSIS — M545 Low back pain, unspecified: Secondary | ICD-10-CM

## 2022-09-09 ENCOUNTER — Ambulatory Visit (INDEPENDENT_AMBULATORY_CARE_PROVIDER_SITE_OTHER): Payer: Medicaid Other | Admitting: Physical Medicine and Rehabilitation

## 2022-09-09 ENCOUNTER — Other Ambulatory Visit: Payer: Self-pay | Admitting: Physical Medicine and Rehabilitation

## 2022-09-09 DIAGNOSIS — M7918 Myalgia, other site: Secondary | ICD-10-CM

## 2022-09-09 DIAGNOSIS — G8929 Other chronic pain: Secondary | ICD-10-CM

## 2022-09-09 DIAGNOSIS — M47816 Spondylosis without myelopathy or radiculopathy, lumbar region: Secondary | ICD-10-CM | POA: Diagnosis not present

## 2022-09-09 DIAGNOSIS — M545 Low back pain, unspecified: Secondary | ICD-10-CM

## 2022-09-09 DIAGNOSIS — M419 Scoliosis, unspecified: Secondary | ICD-10-CM | POA: Diagnosis not present

## 2022-09-09 MED ORDER — METHOCARBAMOL 500 MG PO TABS: 500.0000 mg | ORAL_TABLET | Freq: Three times a day (TID) | ORAL | 0 refills | Status: DC

## 2022-09-09 NOTE — Progress Notes (Deleted)
Tyrone Fernandez - 57 y.o. male MRN [id number]  Date of birth: 05/17/1966  Office Visit Note: Visit Date: 09/09/2022 PCP: Jorge Ny, PA-C Referred by: No ref. provider found  Subjective: No chief complaint on file.  HPI: Tyrone Fernandez is a 57 y.o. male who comes in today for evaluation of chronic, worsening and severe left sided lower back pain. Pain ongoing for several years and worsens with standing, movement and activity.     {Oswestry Disability Score:26558}  ROS Otherwise per HPI.  Assessment & Plan: Visit Diagnoses: No diagnosis found.   Plan: No additional findings.   Meds & Orders: No orders of the defined types were placed in this encounter.  No orders of the defined types were placed in this encounter.   Follow-up: No follow-ups on file.   Procedures: No procedures performed      Clinical History: EXAM: MRI LUMBAR SPINE WITHOUT CONTRAST   TECHNIQUE: Multiplanar, multisequence MR imaging of the lumbar spine was performed. No intravenous contrast was administered.   COMPARISON:  None Available.   FINDINGS: Segmentation:  Standard.   Alignment: Thoracic kyphosis centered at T11. Severe levoscoliosis of the thoracolumbar spine. 3 mm retrolisthesis of L1 on L2.   Vertebrae: No acute fracture, evidence of discitis, or aggressive bone lesion.   Conus medullaris and cauda equina: Conus extends to the L1 level. Conus and cauda equina appear normal.   Paraspinal and other soft tissues: No acute paraspinal abnormality.   Disc levels:   Disc spaces: Degenerative disease with disc height loss T10-11, T11-12, T12-L1 and L1-2.   T11-12: Mild broad-based disc bulge. Mild left foraminal stenosis. No right foraminal stenosis. No spinal stenosis.   T12-L1: Broad-based disc osteophyte complex. Mild left foraminal stenosis. No right foraminal stenosis. Mild bilateral facet arthropathy. No spinal stenosis.   L1-L2: Mild broad-based disc bulge. Mild  bilateral facet arthropathy. No foraminal or central canal stenosis.   L2-L3: No significant disc bulge. Mild bilateral facet arthropathy. No foraminal or central canal stenosis.   L3-L4: No significant disc bulge. Mild bilateral facet arthropathy. No foraminal or central canal stenosis.   L4-L5: No significant disc bulge. Mild bilateral facet arthropathy. No foraminal or central canal stenosis.   L5-S1: No significant disc bulge. Mild bilateral facet arthropathy. No foraminal or central canal stenosis.   IMPRESSION: 1. 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 Severe levoscoliosis of the thoracolumbar spine. 2. Mild lumbar spine spondylosis as described above. 3. No acute osseous injury of the lumbar spine.     Electronically Signed   By: Kathreen Devoid M.D.   On: 09/01/2022 10:17   He reports that he has never smoked. He has never used smokeless tobacco.  Recent Labs    09/10/21 1455  HGBA1C 5.4    Objective:  VS:  HT:    WT:   BMI:     BP:   HR: bpm  TEMP: ( )  RESP:  Physical Exam  Ortho Exam  Imaging: No results found.  Past Medical/Family/Surgical/Social History: Medications & Allergies reviewed per EMR, new medications updated. Patient Active Problem List   Diagnosis Date Noted   Ventral hernia with bowel obstruction 08/19/2021   Hernia of anterior abdominal wall    SBO (small bowel obstruction) ([facility]) 04/08/2016   History of bronchitis 09/27/2014   Cough 09/27/2014   Obesity 09/27/2014   Past Medical History:  Diagnosis Date   H/O hiatal hernia    History of kidney stones    SBO (small bowel  obstruction) ([facility]) 03/2016   Family History  Problem Relation Age of Onset   Diabetes Mother    Cancer Mother        breast cancer    Stroke Maternal Aunt    Heart disease Paternal Grandfather    Past Surgical History:  Procedure Laterality Date   40  KNEE SURGERY     2007 OR 2008  KIDNEY STONE REMOVED     APPENDECTOMY  2003   DONE AT TIME OF COLON  RESECTION   CHOLECYSTECTOMY  2010   AT SAME TIME OF HERNIA REPAIR   COLON SURGERY  2003   COLON RESECTION FOR DIVERTICULITIS / COLOSTOMY   COLOSTOMY REVERSAL  2003   CYSTOSCOPY WITH URETEROSCOPY AND STENT PLACEMENT Right 05/03/2013   Procedure: CYSTOSCOPY, RIGHT RETROGRADE PYELOGRAMRIGHT URETEROSCOPY, STONE EXTRACTION  AND STENT PLACEMENT;  Surgeon: Fredricka Bonine, MD;  Location: [facility];  Service: Urology;  Laterality: Right;   HERNIA REPAIR  2010   VENTRAL HERNIA REPAIR N/A 08/19/2021   Procedure: OPEN HERNIA REPAIR VENTRAL ADULT;  Surgeon: Coralie Keens, MD;  Location: [facility];  Service: General;  Laterality: N/A;   Social History   Occupational History   Not on file  Tobacco Use   Smoking status: Never   Smokeless tobacco: Never  Substance and Sexual Activity   Alcohol use: No   Drug use: No   Sexual activity: Not on file

## 2022-09-09 NOTE — Progress Notes (Unsigned)
Tyrone Fernandez - 57 y.o. male MRN [id number]  Date of birth: 25-Sep-1965  Office Visit Note: Visit Date: 09/09/2022 PCP: Jorge Ny, PA-C Referred by: Jorge Ny, PA-C  Subjective: Chief Complaint  Patient presents with   Lower Back - Follow-up    MRI review   HPI: Tyrone Fernandez is a 57 y.o. male who comes in today for evaluation of chronic, worsening and severe left sided lower back. Pain ongoing for several years and worsens with standing, movement and activity. He describes pain as tender, sore and aching, currently rates as 7 out of 10. Pain noted with household chores/yard work. He describes pain as tender, aching and sore, currently rates as 8 out of 10. Some relief of pain with home exercise regimen, rest and use of medications. Has tried over the counter medications and Meloxicam with minimal relief of pain. Recent lumbar MRI imaging exhibits thoracic kyphosis centered at T11, severe levoscoliosis of the thoracolumbar spine. Mild multi level facet arthropathy. No foraminal or central canal stenosis noted. Patient previously employed as Building control surveyor and states he feels this physically demanding job caused worsening back problems. He is currently unemployed as he feels he is not able to work due to pain. Patient denies focal weakness, numbness and tingling. No recent trauma or falls.     Review of Systems  Musculoskeletal:  Positive for back pain and myalgias.  Neurological:  Negative for tingling, sensory change, focal weakness and weakness.  All other systems reviewed and are negative.  Otherwise per HPI.  Assessment & Plan: Visit Diagnoses:    ICD-10-CM   1. Chronic left-sided low back pain without sciatica  M54.50 Ambulatory referral to Physical Therapy   G89.29     2. Spondylosis without myelopathy or radiculopathy, lumbar region  M47.816 Ambulatory referral to Physical Therapy    3. Scoliosis of thoracolumbar spine, unspecified scoliosis type  M41.9 Ambulatory referral  to Physical Therapy    4. Myofascial pain syndrome  M79.18 Ambulatory referral to Physical Therapy       Plan: Findings:  Chronic, worsening and severe left sided lower back. No radicular symptoms. Patient continues to have severe pain despite good conservative therapies such as home exercise regimen, rest and use of medications. Recent lumbar MRI discussed in detail with patient today using imaging and spine model. Patients clinical presentation and exam are consistent with myofascial pain syndrome. Severe tenderness noted upon palpation of left lumbar paraspinal region. There is severe levoscoliosis of the thoracolumbar spine that we feel could be exacerbating his muscle pain. Next step is to place order for formal physical therapy, I do feel he would benefit from manual treatments, core strengthening and dry needling. I also discussed medication management, he is to continue Meloxicam, I also added Robaxin as needed. I would like to see him back in approximately 6 weeks for re-evaluation. I encouraged him to remain active as tolerated. No red flag symptoms noted upon exam today.     Meds & Orders:  Meds ordered this encounter  Medications   methocarbamol (ROBAXIN) 500 MG tablet    Sig: Take 1 tablet (500 mg total) by mouth 3 (three) times daily.    Dispense:  90 tablet    Refill:  0    Orders Placed This Encounter  Procedures   Ambulatory referral to Physical Therapy    Follow-up: Return for 6 week follow up for re-evaluation.   Procedures: No procedures performed      Clinical History: EXAM: MRI LUMBAR  SPINE WITHOUT CONTRAST   TECHNIQUE: Multiplanar, multisequence MR imaging of the lumbar spine was performed. No intravenous contrast was administered.   COMPARISON:  None Available.   FINDINGS: Segmentation:  Standard.   Alignment: Thoracic kyphosis centered at T11. Severe levoscoliosis of the thoracolumbar spine. 3 mm retrolisthesis of L1 on L2.   Vertebrae: No acute  fracture, evidence of discitis, or aggressive bone lesion.   Conus medullaris and cauda equina: Conus extends to the L1 level. Conus and cauda equina appear normal.   Paraspinal and other soft tissues: No acute paraspinal abnormality.   Disc levels:   Disc spaces: Degenerative disease with disc height loss T10-11, T11-12, T12-L1 and L1-2.   T11-12: Mild broad-based disc bulge. Mild left foraminal stenosis. No right foraminal stenosis. No spinal stenosis.   T12-L1: Broad-based disc osteophyte complex. Mild left foraminal stenosis. No right foraminal stenosis. Mild bilateral facet arthropathy. No spinal stenosis.   L1-L2: Mild broad-based disc bulge. Mild bilateral facet arthropathy. No foraminal or central canal stenosis.   L2-L3: No significant disc bulge. Mild bilateral facet arthropathy. No foraminal or central canal stenosis.   L3-L4: No significant disc bulge. Mild bilateral facet arthropathy. No foraminal or central canal stenosis.   L4-L5: No significant disc bulge. Mild bilateral facet arthropathy. No foraminal or central canal stenosis.   L5-S1: No significant disc bulge. Mild bilateral facet arthropathy. No foraminal or central canal stenosis.   IMPRESSION: 1. 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 Severe levoscoliosis of the thoracolumbar spine. 2. Mild lumbar spine spondylosis as described above. 3. No acute osseous injury of the lumbar spine.     Electronically Signed   By: Kathreen Devoid M.D.   On: 09/01/2022 10:17   He reports that he has never smoked. He has never used smokeless tobacco.  Recent Labs    09/10/21 1455  HGBA1C 5.4    Objective:  VS:  HT:    WT:   BMI:     BP:   HR: bpm  TEMP: ( )  RESP:  Physical Exam Vitals and nursing note reviewed.  HENT:     Head: Normocephalic and atraumatic.     Right Ear: External ear normal.     Left Ear: External ear normal.     Nose: Nose normal.     Mouth/Throat:     Mouth: Mucous membranes are  moist.  Eyes:     Extraocular Movements: Extraocular movements intact.  Cardiovascular:     Rate and Rhythm: Normal rate.     Pulses: Normal pulses.  Pulmonary:     Effort: Pulmonary effort is normal.  Abdominal:     General: Abdomen is flat. There is no distension.  Musculoskeletal:        General: Tenderness present.     Cervical back: Normal range of motion.     Comments: Patient rises from seated position to standing without difficulty. Good lumbar range of motion. No pain noted with facet loading. 5/5 strength noted with bilateral hip flexion, knee flexion/extension, ankle dorsiflexion/plantarflexion and EHL. No clonus noted bilaterally. No pain upon palpation of greater trochanters. No pain with internal/external rotation of bilateral hips. Sensation intact bilaterally. Tenderness noted upon palpation of left lumbar paraspinal region. Ambulates without aid, gait steady.     Skin:    General: Skin is warm and dry.     Capillary Refill: Capillary refill takes less than 2 seconds.  Neurological:     General: No focal deficit present.     Mental Status:  He is alert and oriented to person, place, and time.  Psychiatric:        Mood and Affect: Mood normal.        Behavior: Behavior normal.     Ortho Exam  Imaging: No results found.  Past Medical/Family/Surgical/Social History: Medications & Allergies reviewed per EMR, new medications updated. Patient Active Problem List   Diagnosis Date Noted   Ventral hernia with bowel obstruction 08/19/2021   Hernia of anterior abdominal wall    SBO (small bowel obstruction) ([place]) 04/08/2016   History of bronchitis 09/27/2014   Cough 09/27/2014   Obesity 09/27/2014   Past Medical History:  Diagnosis Date   H/O hiatal hernia    History of kidney stones    SBO (small bowel obstruction) ([place]) 03/2016   Family History  Problem Relation Age of Onset   Diabetes Mother    Cancer Mother        breast cancer    Stroke Maternal Aunt     Heart disease Paternal Grandfather    Past Surgical History:  Procedure Laterality Date   1984  KNEE SURGERY     2007 OR 2008  KIDNEY STONE REMOVED     APPENDECTOMY  2003   DONE AT TIME OF COLON RESECTION   CHOLECYSTECTOMY  2010   AT SAME TIME OF HERNIA REPAIR   COLON SURGERY  2003   COLON RESECTION FOR DIVERTICULITIS / COLOSTOMY   COLOSTOMY REVERSAL  2003   CYSTOSCOPY WITH URETEROSCOPY AND STENT PLACEMENT Right 05/03/2013   Procedure: CYSTOSCOPY, RIGHT RETROGRADE PYELOGRAMRIGHT URETEROSCOPY, STONE EXTRACTION  AND STENT PLACEMENT;  Surgeon: Fredricka Bonine, MD;  Location: [facility];  Service: Urology;  Laterality: Right;   HERNIA REPAIR  2010   VENTRAL HERNIA REPAIR N/A 08/19/2021   Procedure: OPEN HERNIA REPAIR VENTRAL ADULT;  Surgeon: Coralie Keens, MD;  Location: [facility];  Service: General;  Laterality: N/A;   Social History   Occupational History   Not on file  Tobacco Use   Smoking status: Never   Smokeless tobacco: Never  Substance and Sexual Activity   Alcohol use: No   Drug use: No   Sexual activity: Not on file

## 2022-09-10 ENCOUNTER — Encounter: Payer: Self-pay | Admitting: Physician Assistant

## 2022-09-10 ENCOUNTER — Ambulatory Visit: Payer: Medicaid Other | Admitting: Physician Assistant

## 2022-09-10 ENCOUNTER — Encounter: Payer: Self-pay | Admitting: Physical Medicine and Rehabilitation

## 2022-09-10 VITALS — BP 126/72 | HR 98 | Ht 69.0 in | Wt 328.0 lb

## 2022-09-10 DIAGNOSIS — Z1211 Encounter for screening for malignant neoplasm of colon: Secondary | ICD-10-CM

## 2022-09-10 DIAGNOSIS — Z01818 Encounter for other preprocedural examination: Secondary | ICD-10-CM

## 2022-09-10 DIAGNOSIS — Z9049 Acquired absence of other specified parts of digestive tract: Secondary | ICD-10-CM | POA: Diagnosis not present

## 2022-09-10 MED ORDER — NA SULFATE-K SULFATE-MG SULF 17.5-3.13-1.6 GM/177ML PO SOLN: 1.0000 | Freq: Once | ORAL | 0 refills | Status: AC

## 2022-09-10 NOTE — Patient Instructions (Signed)
You have been scheduled for a colonoscopy. Please follow written instructions given to you at your visit today.  Please pick up your prep supplies at the pharmacy within the next 1-3 days. If you use inhalers (even only as needed), please bring them with you on the day of your procedure.   Due to recent changes in healthcare laws, you may see the results of your imaging and laboratory studies on MyChart before your provider has had a chance to review them.  We understand that in some cases there may be results that are confusing or concerning to you. Not all laboratory results come back in the same time frame and the provider may be waiting for multiple results in order to interpret others.  Please give us 48 hours in order for your provider to thoroughly review all the results before contacting the office for clarification of your results.    It was a pleasure to see you today!  Thank you for trusting me with your gastrointestinal care!       

## 2022-09-10 NOTE — Progress Notes (Signed)
Chief Complaint: Discuss colonoscopy  HPI:    Tyrone Fernandez is a 57 year old Caucasian male with a past medical history as listed below including prior cholecystectomy and bowel resection from diverticulitis, who was referred to me by Jorge Ny, PA-C for discussion of a colonoscopy.    08/07/2022 CMP and CBC normal.    Today, patient explains that back in 2002 he had what sounds like a perforated diverticulitis and had to have emergent partial colectomy with colostomy and then had that reversed about 3 months later.  He does not recall ever having a colonoscopy around that time. Tells me he knows he is due now and has grand-children he wants to be around for, so he wants to make sure he is in good health. Denies any acute Gi issues.   Denies fever, chills, weight los, GERD or abdominal pain.  Past Medical History:  Diagnosis Date   H/O hiatal hernia    History of kidney stones    SBO (small bowel obstruction) ([place]) 03/2016    Past Surgical History:  Procedure Laterality Date   1984  KNEE SURGERY     2007 OR 2008  KIDNEY STONE REMOVED     APPENDECTOMY  2003   DONE AT TIME OF COLON RESECTION   CHOLECYSTECTOMY  2010   AT SAME TIME OF HERNIA REPAIR   COLON SURGERY  2003   COLON RESECTION FOR DIVERTICULITIS / COLOSTOMY   COLOSTOMY REVERSAL  2003   CYSTOSCOPY WITH URETEROSCOPY AND STENT PLACEMENT Right 05/03/2013   Procedure: CYSTOSCOPY, RIGHT RETROGRADE PYELOGRAMRIGHT URETEROSCOPY, STONE EXTRACTION  AND STENT PLACEMENT;  Surgeon: Fredricka Bonine, MD;  Location: [facility];  Service: Urology;  Laterality: Right;   HERNIA REPAIR  2010   VENTRAL HERNIA REPAIR N/A 08/19/2021   Procedure: OPEN HERNIA REPAIR VENTRAL ADULT;  Surgeon: Coralie Keens, MD;  Location: [facility];  Service: General;  Laterality: N/A;    Current Outpatient Medications  Medication Sig Dispense Refill   ibuprofen (ADVIL) 600 MG tablet Take 1 tablet (600 mg total) by mouth every 6 (six) hours as needed for moderate  pain. 30 tablet 0   levocetirizine (XYZAL) 5 MG tablet Take 5 mg by mouth daily as needed for allergies.     meloxicam (MOBIC) 15 MG tablet Take 1 tablet (15 mg total) by mouth daily. 30 tablet 0   methocarbamol (ROBAXIN) 500 MG tablet Take 1 tablet (500 mg total) by mouth 3 (three) times daily. 90 tablet 0   No current facility-administered medications for this visit.    Allergies as of 09/10/2022   (No Known Allergies)    Family History  Problem Relation Age of Onset   Diabetes Mother    Cancer Mother        breast cancer    Stroke Maternal Aunt    Heart disease Paternal Grandfather     Social History   Socioeconomic History   Marital status: Significant Other    Spouse name: Not on file   Number of children: Not on file   Years of education: Not on file   Highest education level: Not on file  Occupational History   Not on file  Tobacco Use   Smoking status: Never   Smokeless tobacco: Never  Substance and Sexual Activity   Alcohol use: No   Drug use: No   Sexual activity: Not on file  Other Topics Concern   Not on file  Social History Narrative   Currently not working because of his  hernia   Social Determinants of Health   Financial Resource Strain: Not on file  Food Insecurity: Not on file  Transportation Needs: Not on file  Physical Activity: Not on file  Stress: Not on file  Social Connections: Not on file  Intimate Partner Violence: Not on file    Review of Systems:    Constitutional: No weight loss, fever or chills Skin: No rash  Cardiovascular: No chest pain Respiratory: No SOB  Gastrointestinal: See HPI and otherwise negative Genitourinary: No dysuria  Neurological: No headache, dizziness or syncope Musculoskeletal: No new muscle or joint pain Hematologic: No bleeding Psychiatric: No history of depression or anxiety   Physical Exam:  Vital signs: BP 126/72   Pulse 98   Ht '5\' 9"'$  (1.753 m)   Wt (!) 328 lb (148.8 kg)   BMI 48.44 kg/m     Constitutional:   Pleasant obese Caucasian male appears to be in NAD, Well developed, Well nourished, alert and cooperative Head:  Normocephalic and atraumatic. Eyes:   PEERL, EOMI. No icterus. Conjunctiva pink. Ears:  Normal auditory acuity. Neck:  Supple Throat: Oral cavity and pharynx without inflammation, swelling or lesion.  Respiratory: Respirations even and unlabored. Lungs clear to auscultation bilaterally.   No wheezes, crackles, or rhonchi.  Cardiovascular: Normal S1, S2. No MRG. Regular rate and rhythm. No peripheral edema, cyanosis or pallor.  Gastrointestinal:  Soft, nondistended, nontender. No rebound or guarding. Normal bowel sounds. No appreciable masses or hepatomegaly.+midline surgical scar, umbilicus absent Rectal:  Not performed.  Msk:  Symmetrical without gross deformities. Without edema, no deformity or joint abnormality.  Neurologic:  Alert and  oriented x4;  grossly normal neurologically.  Skin:   Dry and intact without significant lesions or rashes. Psychiatric: Demonstrates good judgement and reason without abnormal affect or behaviors.  RELEVANT LABS AND IMAGING: CBC    Component Value Date/Time   WBC 8.4 08/22/2021 0504   RBC 4.22 08/22/2021 0504   HGB 11.6 (L) 08/22/2021 0504   HCT 36.4 (L) 08/22/2021 0504   PLT 273 08/22/2021 0504   MCV 86.3 08/22/2021 0504   MCH 27.5 08/22/2021 0504   MCHC 31.9 08/22/2021 0504   RDW 13.7 08/22/2021 0504   LYMPHSABS 1.9 09/27/2014 1206   MONOABS 0.7 09/27/2014 1206   EOSABS 0.3 09/27/2014 1206   BASOSABS 0.0 09/27/2014 1206    CMP     Component Value Date/Time   NA 138 08/22/2021 0504   K 3.9 08/22/2021 0504   CL 104 08/22/2021 0504   CO2 28 08/22/2021 0504   GLUCOSE 98 08/22/2021 0504   BUN 8 08/22/2021 0504   CREATININE 0.75 08/22/2021 0504   CREATININE 0.82 09/27/2014 1206   CALCIUM 8.6 (L) 08/22/2021 0504   PROT 8.3 (H) 08/19/2021 0703   ALBUMIN 3.8 08/19/2021 0703   AST 28 08/19/2021 0703   ALT  27 08/19/2021 0703   ALKPHOS 81 08/19/2021 0703   BILITOT 0.4 08/19/2021 0703   GFRNONAA >60 08/22/2021 0504   GFRNONAA >89 09/27/2014 1206   GFRAA >60 04/10/2016 0507   GFRAA >89 09/27/2014 1206    Assessment: 1.  Screening for colorectal cancer: Patient has never had a colonoscopy 2.  History of perforated diverticulitis status post partial colectomy in 2002 3.  Morbid obesity  Plan: 1.  Scheduled patient for screening colonoscopy in the [place].  His BMI is less than 50 today.  This is scheduled with Dr. Hilarie Fredrickson.  Did provide the patient a detailed list of risks for  the procedure and he agrees to proceed. Patient is appropriate for endoscopic procedure(s) in the ambulatory ([facility]) setting.  2.  Patient to follow in clinic per recommendations from Dr. Hilarie Fredrickson after time of procedure.  Ellouise Newer, PA-C [facility] 09/10/2022, 11:24 AM  Cc: Jorge Ny, PA-C

## 2022-09-27 NOTE — Progress Notes (Signed)
Addendum: Reviewed and agree with assessment and management plan. Xiong Haidar M, MD  

## 2022-09-30 ENCOUNTER — Other Ambulatory Visit: Payer: Self-pay

## 2022-09-30 ENCOUNTER — Encounter: Payer: Self-pay | Admitting: Physical Therapy

## 2022-09-30 ENCOUNTER — Ambulatory Visit: Payer: Medicaid Other | Attending: Physical Medicine and Rehabilitation | Admitting: Physical Therapy

## 2022-09-30 DIAGNOSIS — M419 Scoliosis, unspecified: Secondary | ICD-10-CM | POA: Diagnosis not present

## 2022-09-30 DIAGNOSIS — M6281 Muscle weakness (generalized): Secondary | ICD-10-CM | POA: Diagnosis present

## 2022-09-30 DIAGNOSIS — M7918 Myalgia, other site: Secondary | ICD-10-CM | POA: Diagnosis not present

## 2022-09-30 DIAGNOSIS — M47816 Spondylosis without myelopathy or radiculopathy, lumbar region: Secondary | ICD-10-CM | POA: Insufficient documentation

## 2022-09-30 DIAGNOSIS — G8929 Other chronic pain: Secondary | ICD-10-CM | POA: Insufficient documentation

## 2022-09-30 DIAGNOSIS — M545 Low back pain, unspecified: Secondary | ICD-10-CM | POA: Diagnosis not present

## 2022-09-30 DIAGNOSIS — M5459 Other low back pain: Secondary | ICD-10-CM | POA: Insufficient documentation

## 2022-09-30 NOTE — Therapy (Signed)
OUTPATIENT PHYSICAL THERAPY THORACOLUMBAR EVALUATION  Patient Name: Tyrone Fernandez MRN: [id number] DOB:Sep 17, 1965, 57 y.o., male Today's Date: 09/30/2022   PT End of Session - 09/30/22 1253     Visit Number 1    Number of Visits --   1-2x/week   Date for PT Re-Evaluation 11/25/22    Authorization Type [employer] - [employer]    PT Start Time 1215    PT Stop Time 1300    PT Time Calculation (min) 45 min             Past Medical History:  Diagnosis Date   H/O hiatal hernia    History of kidney stones    SBO (small bowel obstruction) 03/2016   Past Surgical History:  Procedure Laterality Date   1984  KNEE SURGERY     2007 OR 2008  KIDNEY STONE REMOVED     APPENDECTOMY  2003   DONE AT TIME OF COLON RESECTION   CHOLECYSTECTOMY  2010   AT SAME TIME OF HERNIA REPAIR   COLON SURGERY  2003   COLON RESECTION FOR DIVERTICULITIS / COLOSTOMY   COLOSTOMY REVERSAL  2003   CYSTOSCOPY WITH URETEROSCOPY AND STENT PLACEMENT Right 05/03/2013   Procedure: CYSTOSCOPY, RIGHT RETROGRADE PYELOGRAMRIGHT URETEROSCOPY, STONE EXTRACTION  AND STENT PLACEMENT;  Surgeon: Fredricka Bonine, MD;  Location: [facility];  Service: Urology;  Laterality: Right;   HERNIA REPAIR  2010   VENTRAL HERNIA REPAIR N/A 08/19/2021   Procedure: OPEN HERNIA REPAIR VENTRAL ADULT;  Surgeon: Coralie Keens, MD;  Location: [place];  Service: General;  Laterality: N/A;   Patient Active Problem List   Diagnosis Date Noted   Ventral hernia with bowel obstruction 08/19/2021   Hernia of anterior abdominal wall    SBO (small bowel obstruction) 04/08/2016   History of bronchitis 09/27/2014   Cough 09/27/2014   Obesity 09/27/2014    PCP: Jorge Ny, PA-C  REFERRING PROVIDER: Lorine Bears, NP  THERAPY DIAG:  Other low back pain  Muscle weakness  REFERRING DIAG: Chronic left-sided low back pain without sciatica [M54.50, G89.29], Spondylosis without myelopathy or radiculopathy, 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 No, Number of falls: 0  MOI/History of condition:  Onset date: 2/23  SUBJECTIVE STATEMENT  Tyrone Fernandez is a 57 y.o. male who presents to clinic with chief complaint of low back L>R with radiation into groin.  This started around the time of a hernia repair surgery in 2/23.  He has a large mesh repair (the size of a dinner plate) in his abdomin from hernia.  He had a history of diverticulitis with bowel obstruction which involved multiple surgeries and he feels this lead to multiple hernias.  Since his hernia surgery he as had progressively worse back pain which is worse with standing.  He is unable to work d/t pain (he is a Building control surveyor by trade)  From referring provider:   "Tyrone Fernandez is a 57 y.o. male who comes in today per the request of Jorge Ny, [place] for evaluation of chronic, worsening and severe left sided lower back. Pain ongoing for several years and worsens with standing. States he was told many years ago  he had "curvature" in his spine. He reports severe pain with household chores such as washing dishes. He describes pain as a sore, aching and grabbing sensation, currently rate as 8 out of 10. Some relief of pain with home exercise regimen, rest and use of medications. He has tried over the counter medications with no relief of pain. No history of physical therapy treatments. CT of abdomen and pelvis from 2023 does exhibit moderate convex left thoracolumbar spine curvature. No history of lumbar surgery/injections. Patient previously employed as Building control surveyor and states he feels this physically demanding job caused worsening back problems.  He is currently unemployed as he feels he is not able to work due to pain.  Patient denies focal weakness, numbness and tingling.  No recent trauma or falls. "   Red flags:  denies bil numbness and tingling, BB changes, and saddle anesthesia  Pain:  Are you having pain? Yes Pain location: Low back pain L>R, L groin NPRS scale:  3/10 to 10/10 Aggravating factors: standing, standing, bending Relieving factors: rest Pain description: intermittent, sharp, and aching Stage: Chronic Stability: staying the same 24 hour pattern: worse with activity    Occupation: not working  Administrator, sports: NA  Hand Dominance: NA  Patient Goals/Specific Activities: reduce pain, improve function   OBJECTIVE:   DIAGNOSTIC FINDINGS:  MRI:  IMPRESSION: 1. 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 Severe levoscoliosis of the thoracolumbar spine. 2. Mild lumbar spine spondylosis as described above. 3. No acute osseous injury of the lumbar spine.  GENERAL OBSERVATION/GAIT:  Slow, antalgic  SENSATION:  Light touch: Appears intact  LUMBAR AROM  AROM AROM  09/30/2022  Flexion Fingertips to knees (limited by ~50%); with raised L ribs  Extension limited by 25%  Right lateral flexion limited by 25%  Left lateral flexion limited by 25%  Right rotation limited by 25%  Left rotation limited by 25%    (Blank rows = not tested)   LE MMT:  MMT Right 09/30/2022 Left 09/30/2022  Hip flexion (L2, L3) 4+ 4  Knee extension (L3) 4+ 4+  Knee flexion 4+ 4  Hip abduction    Hip extension    Hip external rotation    Hip internal rotation    Hip adduction    Ankle dorsiflexion (L4)    Ankle plantarflexion (S1)    Ankle inversion    Ankle eversion    Great Toe ext (L5)    Pelvic tilt unable    (Blank rows = not tested, score listed is out of 5 possible points.  N = WNL, D = diminished, C = clear for gross weakness with myotome testing, * = concordant pain with testing)   LUMBAR SPECIAL TESTS:  Straight  leg raise: L (-), R (-) Slump: L (-), R (-)   Fadir, faber (+) L for groin pain MUSCLE LENGTH: Hamstrings: Right significant restriction; Left significant restriction ASLR: Right ASLR = PSLR; Left ASLR = PSLR (difficult)   LE ROM:  ROM Right 09/30/2022 Left 09/30/2022  Hip flexion    Hip extension    Hip abduction    Hip adduction    Hip internal rotation    Hip external rotation    Knee flexion    Knee extension    Ankle dorsiflexion    Ankle plantarflexion    Ankle inversion    Ankle eversion      (Blank rows = not tested, N = WNL, * = concordant pain with testing)  Functional Tests  Eval (09/30/2022)  Bridge >60''    S/L bridge - unable    30'' STS: 10x w UE support                                                  PATIENT SURVEYS:  Modified Oswestry 22/50    TODAY'S TREATMENT  Creating, reviewing, and completing below HEP  PATIENT EDUCATION:  POC, diagnosis, prognosis, HEP, and outcome measures.  Pt educated via explanation, demonstration, and handout (HEP).  Pt confirms understanding verbally.   HOME EXERCISE PROGRAM: Access Code: [email or web address] URL: [email or web address] Date: 09/30/2022 Prepared by: Shearon Balo  Exercises - Supine Posterior Pelvic Tilt  - 2 x daily - 7 x weekly - 2 sets - 10 reps - 5'' hold - Supine Hip Adduction Isometric with Ball  - 1 x daily - 7 x weekly - 2 sets - 10 reps - 10'' hold - Hooklying Isometric Clamshell  - 1 x daily - 7 x weekly - 3 sets - 10 reps - Bridge  - 1 x daily - 7 x weekly - 3 sets - 10 reps  Treatment priorities   Eval (09/30/2022)        Core and hip strengthening        Lifestyle changes        Try aquatics                          ASSESSMENT:  CLINICAL IMPRESSION: Cluster is a 57 y.o. male who presents to clinic with signs and sxs consistent with mechanical low back pain.   Pt with history of multiple abdominal surgeries including massive hernia repair in 2/23.  He struggles  with volitional abdominal muscle contraction.  Likely concurrent L intraarticular hip pain as well with (+) faber and fadir.  OBJECTIVE IMPAIRMENTS: Pain, lumbar ROM, hip ROM, core/hip/LE strength  ACTIVITY LIMITATIONS: bending, working, walking, standing  PERSONAL FACTORS: See medical history and pertinent history   REHAB POTENTIAL: Good  CLINICAL DECISION MAKING: Stable/uncomplicated  EVALUATION COMPLEXITY: Low   GOALS:   SHORT TERM GOALS: Target date: 10/28/2022  Wylan will be >75% HEP compliant to improve carryover between sessions and facilitate independent management of condition  Evaluation (09/30/2022): ongoing Goal status: INITIAL   LONG TERM GOALS: Target date: 11/25/2022  [place]a will show a >/= 12 pt improvement in their ODI score (MCID is 12 pts) as a proxy for functional improvement   Evaluation/Baseline (09/30/2022): 22/50 pts Goal status: INITIAL   2.  Stevenson will improve 62'' STS (MCID 2) to >/= 10x (w/ UE?: N) to show improved LE strength and improved transfers   Evaluation/Baseline (09/30/2022): 10x  w/ UE? Y Goal status: INITIAL   3.  Ayan will self report >/= 50% decrease in pain from evaluation   Evaluation/Baseline (09/30/2022): 10/10 max pain Goal status: INITIAL   4.  Sigmond will be able to stand for 60 min, not limited by pain to allow completion of household tasks such as cooking and washing dishes  Evaluation/Baseline (09/30/2022): 10-15 min Goal status: INITIAL    5.  Dontarious will be able to walk for 45 min, not limited by pain, to allow shopping and walking for health/recreation  Evaluation/Baseline (09/30/2022): 10-15 min Goal status: INITIAL    PLAN: PT FREQUENCY: 1-2x/week  PT DURATION: 8 weeks (Ending 11/25/2022)  PLANNED INTERVENTIONS:  Therapeutic exercises, Aquatic therapy, Therapeutic activity, Neuro Muscular re-education, Gait training, Patient/Family education, Joint mobilization, Dry Needling, Electrical stimulation, Spinal  mobilization and/or manipulation, Moist heat, Taping, Vasopneumatic device, Ionotophoresis 4mg /ml Dexamethasone, and Manual therapy  PLAN FOR NEXT SESSION:    Shearon Balo PT, DPT 09/30/2022, 1:07 PM

## 2022-10-07 NOTE — Therapy (Signed)
OUTPATIENT PHYSICAL THERAPY TREATMENT NOTE   Patient Name: Tyrone Fernandez MRN: [id number] DOB:04-07-1966, 57 y.o., male Today's Date: 10/07/2022  PCP: Tyrone Skye, PA-C  REFERRING PROVIDER: Quita Skye, PA-C  END OF SESSION:    Past Medical History:  Diagnosis Date   H/O hiatal hernia    History of kidney stones    SBO (small bowel obstruction) 03/2016   Past Surgical History:  Procedure Laterality Date   1984  KNEE SURGERY     2007 OR 2008  KIDNEY STONE REMOVED     APPENDECTOMY  2003   DONE AT TIME OF COLON RESECTION   CHOLECYSTECTOMY  2010   AT SAME TIME OF HERNIA REPAIR   COLON SURGERY  2003   COLON RESECTION FOR DIVERTICULITIS / COLOSTOMY   COLOSTOMY REVERSAL  2003   CYSTOSCOPY WITH URETEROSCOPY AND STENT PLACEMENT Right 05/03/2013   Procedure: CYSTOSCOPY, RIGHT RETROGRADE PYELOGRAMRIGHT URETEROSCOPY, STONE EXTRACTION  AND STENT PLACEMENT;  Surgeon: Antony Haste, MD;  Location: [facility];  Service: Urology;  Laterality: Right;   HERNIA REPAIR  2010   VENTRAL HERNIA REPAIR N/A 08/19/2021   Procedure: OPEN HERNIA REPAIR VENTRAL ADULT;  Surgeon: Abigail Miyamoto, MD;  Location: [facility];  Service: General;  Laterality: N/A;   Patient Active Problem List   Diagnosis Date Noted   Ventral hernia with bowel obstruction 08/19/2021   Hernia of anterior abdominal wall    SBO (small bowel obstruction) 04/08/2016   History of bronchitis 09/27/2014   Cough 09/27/2014   Obesity 09/27/2014      REFERRING DIAG: Chronic left-sided low back pain without sciatica [M54.50, G89.29], Spondylosis without myelopathy or radiculopathy, 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  Rationale for Evaluation and Treatment Rehabilitation  PERTINENT PAST HISTORY:  Hernia of abdominal wall (February 2023), diverticulitis      PRECAUTIONS:  None  SUBJECTIVE:                                                                                                                                                                                      SUBJECTIVE STATEMENT:  ***   PAIN:  Are you having pain? Yes: NPRS scale: ***/10 Pain location: Low back pain L>R, L groin  Pain description: intermittent, sharp, and aching Aggravating factors: standing, standing, bending Relieving factors: rest    OBJECTIVE: (objective measures completed at initial evaluation unless otherwise dated)   DIAGNOSTIC FINDINGS:  MRI:  IMPRESSION: 1. 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 Severe levoscoliosis of the thoracolumbar spine. 2. Mild lumbar spine spondylosis as described above.  3. No acute osseous injury of the lumbar spine.  GENERAL OBSERVATION/GAIT:  Slow, antalgic  SENSATION:  Light touch: Appears intact  LUMBAR AROM  AROM AROM  10/07/2022  Flexion Fingertips to knees (limited by ~50%); with raised L ribs  Extension limited by 25%  Right lateral flexion limited by 25%  Left lateral flexion limited by 25%  Right rotation limited by 25%  Left rotation limited by 25%    (Blank rows = not tested)   LE MMT:  MMT Right 10/07/2022 Left 10/07/2022  Hip flexion (L2, L3) 4+ 4  Knee extension (L3) 4+ 4+  Knee flexion 4+ 4  Hip abduction    Hip extension    Hip external rotation    Hip internal rotation    Hip adduction    Ankle dorsiflexion (L4)    Ankle plantarflexion (S1)    Ankle inversion    Ankle eversion    Great Toe ext (L5)    Pelvic tilt unable    (Blank rows = not tested, score listed is out of 5 possible points.  N = WNL, D = diminished, C = clear for gross weakness with myotome testing, * = concordant pain with testing)   LUMBAR SPECIAL TESTS:  Straight leg raise: L (-), R (-) Slump: L (-), R (-)   Fadir, faber (+) L for groin pain MUSCLE LENGTH: Hamstrings: Right significant restriction; Left significant  restriction ASLR: Right ASLR = PSLR; Left ASLR = PSLR (difficult)   LE ROM:  ROM Right 10/07/2022 Left 10/07/2022  Hip flexion    Hip extension    Hip abduction    Hip adduction    Hip internal rotation    Hip external rotation    Knee flexion    Knee extension    Ankle dorsiflexion    Ankle plantarflexion    Ankle inversion    Ankle eversion      (Blank rows = not tested, N = WNL, * = concordant pain with testing)  Functional Tests  Eval (10/07/2022)    Bridge >60''    S/L bridge - unable    30'' STS: 10x w UE support                                                  PATIENT SURVEYS:  Modified Oswestry 22/50    TODAY'S TREATMENT  Creating, reviewing, and completing below HEP  PATIENT EDUCATION:  POC, diagnosis, prognosis, HEP, and outcome measures.  Pt educated via explanation, demonstration, and handout (HEP).  Pt confirms understanding verbally.   HOME EXERCISE PROGRAM: Access Code: [email or web address] URL: [email or web address] Date: 09/30/2022 Prepared by: Alphonzo SeveranceKarl Reinhartsen  Exercises - Supine Posterior Pelvic Tilt  - 2 x daily - 7 x weekly - 2 sets - 10 reps - 5'' hold - Supine Hip Adduction Isometric with Ball  - 1 x daily - 7 x weekly - 2 sets - 10 reps - 10'' hold - Hooklying Isometric Clamshell  - 1 x daily - 7 x weekly - 3 sets - 10 reps - Bridge  - 1 x daily - 7 x weekly - 3 sets - 10 reps  Treatment priorities   Eval (10/07/2022)        Core and hip strengthening        Lifestyle changes        Try aquatics  ASSESSMENT:  CLINICAL IMPRESSION: ***  Tyrone Fernandez is a 57 y.o. male who presents to clinic with signs and sxs consistent with mechanical low back pain.   Pt with history of multiple abdominal surgeries including massive hernia repair in 2/23.  He struggles with volitional abdominal muscle contraction.  Likely concurrent L intraarticular hip pain as well with (+) faber and fadir.  OBJECTIVE IMPAIRMENTS:  Pain, lumbar ROM, hip ROM, core/hip/LE strength  ACTIVITY LIMITATIONS: bending, working, walking, standing  PERSONAL FACTORS: See medical history and pertinent history   REHAB POTENTIAL: Good  CLINICAL DECISION MAKING: Stable/uncomplicated  EVALUATION COMPLEXITY: Low   GOALS:   SHORT TERM GOALS: Target date: 11/04/2022  Tyrone Fernandez will be >75% HEP compliant to improve carryover between sessions and facilitate independent management of condition  Evaluation (10/07/2022): ongoing Goal status: INITIAL   LONG TERM GOALS: Target date: 12/02/2022  Erlin will show a >/= 12 pt improvement in their ODI score (MCID is 12 pts) as a proxy for functional improvement   Evaluation/Baseline (10/07/2022): 22/50 pts Goal status: INITIAL   2.  Kharon will improve 30'' STS (MCID 2) to >/= 10x (w/ UE?: N) to show improved LE strength and improved transfers   Evaluation/Baseline (10/07/2022): 10x  w/ UE? Y Goal status: INITIAL   3.  Leaf will self report >/= 50% decrease in pain from evaluation   Evaluation/Baseline (10/07/2022): 10/10 max pain Goal status: INITIAL   4.  Reynolds will be able to stand for 60 min, not limited by pain to allow completion of household tasks such as cooking and washing dishes  Evaluation/Baseline (10/07/2022): 10-15 min Goal status: INITIAL    5.  Alvan will be able to walk for 45 min, not limited by pain, to allow shopping and walking for health/recreation  Evaluation/Baseline (10/07/2022): 10-15 min Goal status: INITIAL    PLAN: PT FREQUENCY: 1-2x/week  PT DURATION: 8 weeks (Ending 12/02/2022)  PLANNED INTERVENTIONS: Therapeutic exercises, Aquatic therapy, Therapeutic activity, Neuro Muscular re-education, Gait training, Patient/Family education, Joint mobilization, Dry Needling, Electrical stimulation, Spinal mobilization and/or manipulation, Moist heat, Taping, Vasopneumatic device, Ionotophoresis 4mg /ml Dexamethasone, and Manual therapy  PLAN FOR NEXT SESSION:  Mauri Reading, PT, DPT 10/07/2022, 6:05 PM

## 2022-10-08 ENCOUNTER — Ambulatory Visit: Payer: Medicaid Other

## 2022-10-08 DIAGNOSIS — M5459 Other low back pain: Secondary | ICD-10-CM | POA: Diagnosis not present

## 2022-10-08 DIAGNOSIS — M6281 Muscle weakness (generalized): Secondary | ICD-10-CM

## 2022-10-15 ENCOUNTER — Ambulatory Visit: Payer: Medicaid Other

## 2022-10-15 DIAGNOSIS — M6281 Muscle weakness (generalized): Secondary | ICD-10-CM

## 2022-10-15 DIAGNOSIS — M5459 Other low back pain: Secondary | ICD-10-CM | POA: Diagnosis not present

## 2022-10-15 NOTE — Therapy (Signed)
OUTPATIENT PHYSICAL THERAPY TREATMENT NOTE   Patient Name: Tyrone Fernandez MRN: [id number] DOB:11-07-1965, 57 y.o., male Today's Date: 10/15/2022  PCP: Quita Skye, PA-C  REFERRING PROVIDER: Quita Skye, PA-C  END OF SESSION:   PT End of Session - 10/15/22 0957     Visit Number 3    PT Start Time 1000    PT Stop Time 1043    PT Time Calculation (min) 43 min    Activity Tolerance Patient tolerated treatment well;Patient limited by fatigue;Patient limited by pain    Behavior During Therapy [facility] for tasks assessed/performed              Past Medical History:  Diagnosis Date   H/O hiatal hernia    History of kidney stones    SBO (small bowel obstruction) 03/2016   Past Surgical History:  Procedure Laterality Date   1984  KNEE SURGERY     2007 OR 2008  KIDNEY STONE REMOVED     APPENDECTOMY  2003   DONE AT TIME OF COLON RESECTION   CHOLECYSTECTOMY  2010   AT SAME TIME OF HERNIA REPAIR   COLON SURGERY  2003   COLON RESECTION FOR DIVERTICULITIS / COLOSTOMY   COLOSTOMY REVERSAL  2003   CYSTOSCOPY WITH URETEROSCOPY AND STENT PLACEMENT Right 05/03/2013   Procedure: CYSTOSCOPY, RIGHT RETROGRADE PYELOGRAMRIGHT URETEROSCOPY, STONE EXTRACTION  AND STENT PLACEMENT;  Surgeon: Antony Haste, MD;  Location: [facility];  Service: Urology;  Laterality: Right;   HERNIA REPAIR  2010   VENTRAL HERNIA REPAIR N/A 08/19/2021   Procedure: OPEN HERNIA REPAIR VENTRAL ADULT;  Surgeon: Abigail Miyamoto, MD;  Location: [facility];  Service: General;  Laterality: N/A;   Patient Active Problem List   Diagnosis Date Noted   Ventral hernia with bowel obstruction 08/19/2021   Hernia of anterior abdominal wall    SBO (small bowel obstruction) 04/08/2016   History of bronchitis 09/27/2014   Cough 09/27/2014   Obesity 09/27/2014     REFERRING DIAG: Chronic left-sided low back pain without sciatica [M54.50, G89.29], Spondylosis without myelopathy or radiculopathy, 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.816],  Scoliosis of thoracolumbar spine, unspecified scoliosis type [M41.9], Myofascial pain syndrome [M79.18]    THERAPY DIAG:  Other low back pain  Muscle weakness  Rationale for Evaluation and Treatment Rehabilitation  PERTINENT PAST HISTORY:  Hernia of abdominal wall (February 2023), diverticulitis      PRECAUTIONS: None  SUBJECTIVE:                                                                                                                                                                                      SUBJECTIVE STATEMENT:  Patient states that he has been compliant with HEP and feels that he is getting stronger. "I noticed that it's even easier for me to bend down to tie my shoes."    PAIN: Are you having pain? Yes: NPRS scale: 4/10 Pain: 8/10 pain at worst (with standing)   Pain location: Low back pain L>R, L groin Pain description: intermittent, sharp, and aching Aggravating factors: standing, standing, bending Relieving factors: rest    OBJECTIVE: (objective measures completed at initial evaluation unless otherwise dated)   DIAGNOSTIC FINDINGS:  MRI:  IMPRESSION: 1. 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 Severe levoscoliosis of the thoracolumbar spine. 2. Mild lumbar spine spondylosis as described above. 3. No acute osseous injury of the lumbar spine.  GENERAL OBSERVATION/GAIT:  Slow, antalgic  SENSATION:  Light touch: Appears intact  LUMBAR AROM  AROM AROM  10/07/2022  Flexion Fingertips to knees (limited by ~50%); with raised L ribs  Extension limited by 25%  Right lateral flexion limited by 25%  Left lateral flexion limited by 25%  Right rotation limited by 25%  Left rotation limited by 25%    (Blank rows = not tested)   LE MMT:  MMT Right 10/07/2022 Left 10/07/2022  Hip flexion (L2, L3) 4+ 4  Knee extension (L3) 4+ 4+  Knee flexion 4+ 4  Hip abduction    Hip extension    Hip external rotation    Hip internal rotation    Hip adduction     Ankle dorsiflexion (L4)    Ankle plantarflexion (S1)    Ankle inversion    Ankle eversion    Great Toe ext (L5)    Pelvic tilt unable    (Blank rows = not tested, score listed is out of 5 possible points.  N = WNL, D = diminished, C = clear for gross weakness with myotome testing, * = concordant pain with testing)   LUMBAR SPECIAL TESTS:  Straight leg raise: L (-), R (-) Slump: L (-), R (-)   Fadir, faber (+) L for groin pain MUSCLE LENGTH: Hamstrings: Right significant restriction; Left significant restriction ASLR: Right ASLR = PSLR; Left ASLR = PSLR (difficult)   Functional Tests  Eval (10/07/2022)    Bridge >60''    S/L bridge - unable    30'' STS: 10x w UE support                                                  PATIENT SURVEYS:  Modified Oswestry 22/50   [facility] Adult PT Treatment:                                                DATE: 10/15/2022  Therapeutic Exercise: Supine Hip Adduction Isometric with Ball squeeze, 2 x 10, 5 sec hold  Supine Bridge with gait belt for isometric hip abduction, 3 x 8  Supine Lower Trunk Rotation, 10x bilaterally, 3 sec hold at end of session  NuStep Recumbent Stepper x 8 minutes to address pain modulation, concurrent with self-care discussion/education   Neuromuscular re-ed: Supine Posterior pelvic tilt with verbal/visual cues initially for correct performance, 2 x 10, 2-3 sec hold  Supine marching with posterior pelvic tilt, 2 x 10 Supine Knee Fallouts with  red theraband, 2 x 10 bilaterally  Supine Straight leg raise with 50% abdominal bracing, 2 x 10 bilaterally  Ongoing patient education regarding contributing factors to abdominal and lumbar weakness, goals for therapy/HEP.   Self-care:  Patient education regarding importance of focusing on functional improvements, pain science education regarding report of brief anxiety with new exercises, and plan for gradual progression towards normal recreational activities. Discussed  use of aquatic environment and non-weight bearing activities to build activity tolerance, with gradual introduction concurrent with skilled PT prior to discharge.   [facility] Adult PT Treatment:                                                DATE: 10/08/2022  Therapeutic Exercise: Supine Hip Adduction Isometric with Ball squeeze, 2 x 10, 5 sec hold  Supine Bridge with gait belt for isometric hip abduction, 3 x 8  Supine Straight leg raise with 50% abdominal bracing, 10x bilaterally Supine Lower Trunk Rotation, 10x bilaterally, 3 sec hold at end of session   Neuromuscular re-ed: Supine Posterior pelvic tilt x 5 with tactile cues for correct performance, 2 x 10, 2-3 sec hold  Supine marching with posterior pelvic tilt, 2 x 10 Patient education regarding contributing factors to abdominal and lumbar weakness, goals for therapy/HEP.    PATIENT EDUCATION:  POC, diagnosis, prognosis, HEP, and outcome measures.  Pt educated via explanation, demonstration, and handout (HEP).  Pt confirms understanding verbally.   HOME EXERCISE PROGRAM: Access Code: [email or web address] URL: [email or web address] Date: 10/08/2022 Prepared by: Mauri Reading  Exercises - Supine Posterior Pelvic Tilt  - 2 x daily - 7 x weekly - 2 sets - 10 reps - 5'' hold - Supine Hip Adduction Isometric with Ball  - 1 x daily - 7 x weekly - 2 sets - 10 reps - 10'' hold - Hooklying Isometric Clamshell  - 1 x daily - 7 x weekly - 3 sets - 10 reps - Bridge  - 1 x daily - 7 x weekly - 3 sets - 10 reps - Small Range Straight Leg Raise  - 1 x daily - 7 x weekly - 3 sets - 10 reps  Treatment priorities   Eval (10/07/2022)        Core and hip strengthening        Lifestyle changes        Try aquatics                          ASSESSMENT:  CLINICAL IMPRESSION: Patient was able to perform additional repetitions of supine exercises, demonstrating improving abdominal endurance. He continues to have difficulty with initial performance  of posterior pelvic tilt, that was improved with verbal and visual cueing. He was able to tolerate initial use of recumbent stepper at end of session for pain modulation effects and improved overall activity tolerance. Pt has great compliance with his home program and other physical therapy recommendations. We will continue to progress therapeutic interventions as tolerated. He will begin alternating aquatic and land visits at next visit.    OBJECTIVE IMPAIRMENTS: Pain, lumbar ROM, hip ROM, core/hip/LE strength  ACTIVITY LIMITATIONS: bending, working, walking, standing  PERSONAL FACTORS: See medical history and pertinent history   REHAB POTENTIAL: Good  CLINICAL DECISION MAKING: Stable/uncomplicated  EVALUATION COMPLEXITY: Low   GOALS:   SHORT TERM GOALS:  Target date: 11/04/2022  [facility] will be >75% HEP compliant to improve carryover between sessions and facilitate independent management of condition  Evaluation (10/07/2022): ongoing Goal status: INITIAL   LONG TERM GOALS: Target date: 12/02/2022  [place] will show a >/= 12 pt improvement in their ODI score (MCID is 12 pts) as a proxy for functional improvement   Evaluation/Baseline (10/07/2022): 22/50 pts Goal status: INITIAL   2.  Zakhari will improve 30'' STS (MCID 2) to >/= 10x (w/ UE?: N) to show improved LE strength and improved transfers   Evaluation/Baseline (10/07/2022): 10x  w/ UE? Y Goal status: INITIAL   3.  Ross will self report >/= 50% decrease in pain from evaluation   Evaluation/Baseline (10/07/2022): 10/10 max pain Goal status: INITIAL   4.  Jabin will be able to stand for 60 min, not limited by pain to allow completion of household tasks such as cooking and washing dishes  Evaluation/Baseline (10/07/2022): 10-15 min Goal status: INITIAL    5.  [place] will be able to walk for 45 min, not limited by pain, to allow shopping and walking for health/recreation  Evaluation/Baseline (10/07/2022): 10-15 min Goal status:  INITIAL    PLAN: PT FREQUENCY: 1-2x/week  PT DURATION: 8 weeks (Ending 12/02/2022)  PLANNED INTERVENTIONS: Therapeutic exercises, Aquatic therapy, Therapeutic activity, Neuro Muscular re-education, Gait training, Patient/Family education, Joint mobilization, Dry Needling, Electrical stimulation, Spinal mobilization and/or manipulation, Moist heat, Taping, Vasopneumatic device, Ionotophoresis /ml Dexamethasone, and Manual therapy  PLAN FOR NEXT SESSION: Continue with core stabilization program, hip strengthening and progress to seated position when indicated. Begin aquatic PT at next visit. Continue addressing overall activity tolerance.     Mauri Reading, PT, DPT 10/15/2022, 10:56 AM

## 2022-10-21 ENCOUNTER — Encounter: Payer: Self-pay | Admitting: Physical Medicine and Rehabilitation

## 2022-10-21 ENCOUNTER — Ambulatory Visit: Payer: Medicaid Other | Admitting: Physical Medicine and Rehabilitation

## 2022-10-21 VITALS — BP 138/84 | HR 109

## 2022-10-21 DIAGNOSIS — M7918 Myalgia, other site: Secondary | ICD-10-CM | POA: Diagnosis not present

## 2022-10-21 DIAGNOSIS — M5442 Lumbago with sciatica, left side: Secondary | ICD-10-CM

## 2022-10-21 DIAGNOSIS — M5416 Radiculopathy, lumbar region: Secondary | ICD-10-CM

## 2022-10-21 DIAGNOSIS — M419 Scoliosis, unspecified: Secondary | ICD-10-CM | POA: Diagnosis not present

## 2022-10-21 DIAGNOSIS — G8929 Other chronic pain: Secondary | ICD-10-CM

## 2022-10-21 NOTE — Progress Notes (Signed)
Tyrone Fernandez - 57 y.o. male MRN [id number]  Date of birth: 05-31-1966  Office Visit Note: Visit Date: 10/21/2022 PCP: Quita Skye, PA-C Referred by: Quita Skye, PA-C  Subjective: Chief Complaint  Patient presents with   Lower Back - Pain, Follow-up   HPI: Tyrone Fernandez is a 57 y.o. male who comes in today for evaluation of chronic, worsening and severe left lower back pain with intermittent radiation of pain to left groin/testicle. Pain ongoing for several years, worsens with standing, reports he is unable to stand for more than 10 minutes. He describes pain as sore and aching, currently rates as 8 out of 10. Some relief of pain with home exercise regimen, rest and use of medications. Patient is currently undergoing physical therapy with some relief of pain. Recent lumbar MRI imaging exhibits thoracic kyphosis centered at T11, severe levoscoliosis of the thoracolumbar spine. Mild multi level facet arthropathy. No foraminal or central canal stenosis noted. Patient previously employed as Psychologist, occupational, however states is unable to work now as his pain has become so severe. He is considering applying for disability. History of multiple abdominal surgeries, he has not seen urology regarding left testicular pain. Patient denies focal weakness, numbness and tingling. No recent trauma or falls.    Review of Systems  Musculoskeletal:  Positive for back pain.  Neurological:  Negative for tingling, sensory change, focal weakness and weakness.  All other systems reviewed and are negative.  Otherwise per HPI.  Assessment & Plan: Visit Diagnoses:    ICD-10-CM   1. Chronic left-sided low back pain with left-sided sciatica  M54.42 Ambulatory referral to Physical Medicine Rehab   G89.29     2. Lumbar radiculopathy  M54.16 Ambulatory referral to Physical Medicine Rehab    3. Scoliosis of thoracolumbar spine, unspecified scoliosis type  M41.9 Ambulatory referral to Physical Medicine Rehab    4.  Myofascial pain syndrome  M79.18 Ambulatory referral to Physical Medicine Rehab    5. Morbid (severe) obesity due to excess calories  E66.01 Ambulatory referral to Physical Medicine Rehab       Plan: Findings:  1. Chronic, worsening and severe left sided lower back pain, intermittent radiation of pain to left groin and testicle. Patient continues to have severe pain despite good conservative therapy such as formal physical therapy, home exercise regimen, rest and use of medications. Patients clinical presentation and exam are consistent with both T12 and L1 nerve patterns as his pain does radiate to left groin/testicle. There is thoracic kyphosis centered at T11, severe levoscoliosis of the thoracolumbar spine noted. Could be referred pain from scoliotic curvature. Next step is to perform diagnostic and hopefully therapeutic left T12-L1 interlaminar epidural steroid injection under fluoroscopic guidance.  Patient is not currently taking anticoagulant medication.  If good relief of pain with this injection we can repeat this procedure infrequently as needed. I discussed injection procedure in detail with patient, also provided him with educational literature to take home and review. I offered patient oral pre-procedure sedation, however he declined at this time. I will go ahead and place order for injection, however patient seemed apprehensive regarding this procedure. He can continue with physical therapy as tolerated. I instructed him to call with any questions. No red flag symptoms noted upon exam today.   2.  If pain persists post injection we do recommend patient follow-up with urology for evaluation of left testicular pain.  He is not experiencing urinary symptoms at this time. We are concerned this could be  more of a urological issue as recent lumbar MRI imaging shows only mild spondylosis, no nerve impingement, no spinal canal stenosis. I also briefly discussed possible surgical evaluation with  patient. I am unsure he would be ideal surgical candidate as procedure to correct scoliosis would likely be complex. Surgery would also be complicated by morbid obesity.     Meds & Orders: No orders of the defined types were placed in this encounter.   Orders Placed This Encounter  Procedures   Ambulatory referral to Physical Medicine Rehab    Follow-up: Return for Left T12-L1 interlaminar epidural steroid injection.   Procedures: No procedures performed      Clinical History: EXAM: MRI LUMBAR SPINE WITHOUT CONTRAST   TECHNIQUE: Multiplanar, multisequence MR imaging of the lumbar spine was performed. No intravenous contrast was administered.   COMPARISON:  None Available.   FINDINGS: Segmentation:  Standard.   Alignment: Thoracic kyphosis centered at T11. Severe levoscoliosis of the thoracolumbar spine. 3 mm retrolisthesis of L1 on L2.   Vertebrae: No acute fracture, evidence of discitis, or aggressive bone lesion.   Conus medullaris and cauda equina: Conus extends to the L1 level. Conus and cauda equina appear normal.   Paraspinal and other soft tissues: No acute paraspinal abnormality.   Disc levels:   Disc spaces: Degenerative disease with disc height loss T10-11, T11-12, T12-L1 and L1-2.   T11-12: Mild broad-based disc bulge. Mild left foraminal stenosis. No right foraminal stenosis. No spinal stenosis.   T12-L1: Broad-based disc osteophyte complex. Mild left foraminal stenosis. No right foraminal stenosis. Mild bilateral facet arthropathy. No spinal stenosis.   L1-L2: Mild broad-based disc bulge. Mild bilateral facet arthropathy. No foraminal or central canal stenosis.   L2-L3: No significant disc bulge. Mild bilateral facet arthropathy. No foraminal or central canal stenosis.   L3-L4: No significant disc bulge. Mild bilateral facet arthropathy. No foraminal or central canal stenosis.   L4-L5: No significant disc bulge. Mild bilateral facet  arthropathy. No foraminal or central canal stenosis.   L5-S1: No significant disc bulge. Mild bilateral facet arthropathy. No foraminal or central canal stenosis.   IMPRESSION: 1. 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 Severe levoscoliosis of the thoracolumbar spine. 2. Mild lumbar spine spondylosis as described above. 3. No acute osseous injury of the lumbar spine.     Electronically Signed   By: Elige Ko M.D.   On: 09/01/2022 10:17   He reports that he has never smoked. He has never used smokeless tobacco. No results for input(s): "HGBA1C", "LABURIC" in the last 8760 hours.  Objective:  VS:  HT:    WT:   BMI:     BP:138/84  HR:(!) 109bpm  TEMP: ( )  RESP:  Physical Exam Vitals and nursing note reviewed.  HENT:     Head: Normocephalic and atraumatic.     Right Ear: External ear normal.     Left Ear: External ear normal.     Mouth/Throat:     Mouth: Mucous membranes are moist.  Eyes:     Extraocular Movements: Extraocular movements intact.  Cardiovascular:     Rate and Rhythm: Normal rate.     Pulses: Normal pulses.  Pulmonary:     Effort: Pulmonary effort is normal.  Abdominal:     General: Abdomen is flat. There is no distension.  Musculoskeletal:        General: Tenderness present.     Cervical back: Normal range of motion.     Comments: Patient rises from seated position  to standing without difficulty. Good lumbar range of motion. No pain noted with facet loading. 5/5 strength noted with bilateral hip flexion, knee flexion/extension, ankle dorsiflexion/plantarflexion and EHL. No clonus noted bilaterally. No pain upon palpation of greater trochanters. No pain with internal/external rotation of bilateral hips. Sensation intact bilaterally. Dysesthesias noted to both T12 and L1 dermatomes. Negative slump test bilaterally. Ambulates without aid, gait steady.     Skin:    General: Skin is warm and dry.     Capillary Refill: Capillary refill takes less than 2 seconds.   Neurological:     General: No focal deficit present.     Mental Status: He is alert and oriented to person, place, and time.  Psychiatric:        Mood and Affect: Mood normal.        Behavior: Behavior normal.     Ortho Exam  Imaging: No results found.  Past Medical/Family/Surgical/Social History: Medications & Allergies reviewed per EMR, new medications updated. Patient Active Problem List   Diagnosis Date Noted   Ventral hernia with bowel obstruction 08/19/2021   Hernia of anterior abdominal wall    SBO (small bowel obstruction) 04/08/2016   History of bronchitis 09/27/2014   Cough 09/27/2014   Obesity 09/27/2014   Past Medical History:  Diagnosis Date   H/O hiatal hernia    History of kidney stones    SBO (small bowel obstruction) 03/2016   Family History  Problem Relation Age of Onset   Diabetes Mother    Cancer Mother        breast cancer    Stroke Maternal Aunt    Heart disease Paternal Grandfather    Past Surgical History:  Procedure Laterality Date   1984  KNEE SURGERY     2007 OR 2008  KIDNEY STONE REMOVED     APPENDECTOMY  2003   DONE AT TIME OF COLON RESECTION   CHOLECYSTECTOMY  2010   AT SAME TIME OF HERNIA REPAIR   COLON SURGERY  2003   COLON RESECTION FOR DIVERTICULITIS / COLOSTOMY   COLOSTOMY REVERSAL  2003   CYSTOSCOPY WITH URETEROSCOPY AND STENT PLACEMENT Right 05/03/2013   Procedure: CYSTOSCOPY, RIGHT RETROGRADE PYELOGRAMRIGHT URETEROSCOPY, STONE EXTRACTION  AND STENT PLACEMENT;  Surgeon: Antony Haste, MD;  Location: [facility];  Service: Urology;  Laterality: Right;   HERNIA REPAIR  2010   VENTRAL HERNIA REPAIR N/A 08/19/2021   Procedure: OPEN HERNIA REPAIR VENTRAL ADULT;  Surgeon: Abigail Miyamoto, MD;  Location: [facility];  Service: General;  Laterality: N/A;   Social History   Occupational History   Not on file  Tobacco Use   Smoking status: Never   Smokeless tobacco: Never  Substance and Sexual Activity   Alcohol use: No    Drug use: No   Sexual activity: Not on file

## 2022-10-21 NOTE — Progress Notes (Signed)
Functional Pain Scale - descriptive words and definitions  Uncomfortable (3)  Pain is present but can complete all ADL's/sleep is slightly affected and passive distraction only gives marginal relief. Mild range order  Average Pain 2-8 from morning to evening, depending on how much activity has occurred throughout the day   +Driver, -BT, -Dye Allergies.

## 2022-10-22 ENCOUNTER — Ambulatory Visit: Payer: Medicaid Other | Admitting: Physical Therapy

## 2022-10-28 ENCOUNTER — Ambulatory Visit: Payer: Medicaid Other | Admitting: Physical Therapy

## 2022-11-05 ENCOUNTER — Ambulatory Visit: Payer: Medicaid Other | Admitting: Physical Therapy

## 2022-11-13 ENCOUNTER — Encounter: Payer: Self-pay | Admitting: Internal Medicine

## 2022-11-13 ENCOUNTER — Ambulatory Visit (AMBULATORY_SURGERY_CENTER): Payer: Medicaid Other | Admitting: Internal Medicine

## 2022-11-13 VITALS — BP 108/75 | HR 95 | Temp 98.6°F | Resp 14 | Ht 69.0 in | Wt 328.0 lb

## 2022-11-13 DIAGNOSIS — D124 Benign neoplasm of descending colon: Secondary | ICD-10-CM

## 2022-11-13 DIAGNOSIS — D122 Benign neoplasm of ascending colon: Secondary | ICD-10-CM | POA: Diagnosis not present

## 2022-11-13 DIAGNOSIS — Z1211 Encounter for screening for malignant neoplasm of colon: Secondary | ICD-10-CM | POA: Diagnosis not present

## 2022-11-13 DIAGNOSIS — D120 Benign neoplasm of cecum: Secondary | ICD-10-CM | POA: Diagnosis not present

## 2022-11-13 MED ORDER — SODIUM CHLORIDE 0.9 % IV SOLN: 500.0000 mL | Freq: Once | INTRAVENOUS | Status: DC

## 2022-11-13 NOTE — Progress Notes (Signed)
GASTROENTEROLOGY PROCEDURE H&P NOTE   Primary Care Physician: Quita Skye, PA-C    Reason for Procedure:  Colon cancer screening  Plan:    Colonoscopy  Patient is appropriate for endoscopic procedure(s) in the ambulatory ([facility]) setting.  The nature of the procedure, as well as the risks, benefits, and alternatives were carefully and thoroughly reviewed with the patient. Ample time for discussion and questions allowed. The patient understood, was satisfied, and agreed to proceed.     HPI: Tyrone Fernandez is a 57 y.o. male who presents for colonoscopy.  Medical history as below.  Tolerated the prep.  No recent chest pain or shortness of breath.  No abdominal pain today.  Past Medical History:  Diagnosis Date   Allergy    H/O hiatal hernia    History of kidney stones    SBO (small bowel obstruction) (HCC) 03/2016    Past Surgical History:  Procedure Laterality Date   1984  KNEE SURGERY     2007 OR 2008  KIDNEY STONE REMOVED     APPENDECTOMY  06/30/2001   DONE AT TIME OF COLON RESECTION   CHOLECYSTECTOMY  06/30/2008   AT SAME TIME OF HERNIA REPAIR   COLON SURGERY  06/30/2001   COLON RESECTION FOR DIVERTICULITIS / COLOSTOMY   COLOSTOMY     COLOSTOMY REVERSAL  06/30/2001   CYSTOSCOPY WITH URETEROSCOPY AND STENT PLACEMENT Right 05/03/2013   Procedure: CYSTOSCOPY, RIGHT RETROGRADE PYELOGRAMRIGHT URETEROSCOPY, STONE EXTRACTION  AND STENT PLACEMENT;  Surgeon: Antony Haste, MD;  Location: [facility];  Service: Urology;  Laterality: Right;   HERNIA REPAIR  06/30/2008   POLYPECTOMY     VENTRAL HERNIA REPAIR N/A 08/19/2021   Procedure: OPEN HERNIA REPAIR VENTRAL ADULT;  Surgeon: Abigail Miyamoto, MD;  Location: [facility];  Service: General;  Laterality: N/A;    Prior to Admission medications   Medication Sig Start Date End Date Taking? Authorizing Provider  fluticasone (FLONASE) 50 MCG/ACT nasal spray Place 2 sprays into both nostrils daily. 08/26/14 01/30/20 Yes Everlene Farrier, PA-C  ibuprofen (ADVIL) 600 MG tablet Take 1 tablet (600 mg total) by mouth every 6 (six) hours as needed for moderate pain. Patient not taking: Reported on 09/10/2022 08/23/21   Juliet Rude, PA-C  levocetirizine (XYZAL) 5 MG tablet Take 5 mg by mouth daily as needed for allergies. Patient not taking: Reported on 09/10/2022    [provider]  meloxicam (MOBIC) 15 MG tablet Take 1 tablet (15 mg total) by mouth daily. 08/13/22 08/13/23  Juanda Chance, NP  methocarbamol (ROBAXIN) 500 MG tablet Take 1 tablet (500 mg total) by mouth 3 (three) times daily. 09/09/22   Juanda Chance, NP  albuterol (PROVENTIL HFA;VENTOLIN HFA) 108 (90 BASE) MCG/ACT inhaler Inhale 1-2 puffs into the lungs every 6 (six) hours as needed for wheezing or shortness of breath. Patient not taking: Reported on 04/08/2016 09/03/14 01/30/20  Joycie Peek, PA-C  omeprazole (PRILOSEC) 10 MG capsule Take 1 capsule (10 mg total) by mouth daily. 11/07/17 01/30/20  Cuthriell, Delorise Royals, PA-C    Current Outpatient Medications  Medication Sig Dispense Refill   ibuprofen (ADVIL) 600 MG tablet Take 1 tablet (600 mg total) by mouth every 6 (six) hours as needed for moderate pain. (Patient not taking: Reported on 09/10/2022) 30 tablet 0   levocetirizine (XYZAL) 5 MG tablet Take 5 mg by mouth daily as needed for allergies. (Patient not taking: Reported on 09/10/2022)     meloxicam (MOBIC) 15 MG tablet  Take 1 tablet (15 mg total) by mouth daily. 30 tablet 0   methocarbamol (ROBAXIN) 500 MG tablet Take 1 tablet (500 mg total) by mouth 3 (three) times daily. 90 tablet 0   Current Facility-Administered Medications  Medication Dose Route Frequency Provider Last Rate Last Admin   0.9 %  sodium chloride infusion  500 mL Intravenous Once Weylin Plagge, Carie Caddy, MD        Allergies as of 11/13/2022   (No Known Allergies)    Family History  Problem Relation Age of Onset   Crohn's disease Mother    Diabetes Mother    Cancer Mother         breast cancer    Stroke Maternal Aunt    Heart disease Paternal Grandfather    Colon cancer Neg Hx    Pancreatic cancer Neg Hx    Rectal cancer Neg Hx    Stomach cancer Neg Hx    Ulcerative colitis Neg Hx     Social History   Socioeconomic History   Marital status: Significant Other    Spouse name: Not on file   Number of children: Not on file   Years of education: Not on file   Highest education level: Not on file  Occupational History   Not on file  Tobacco Use   Smoking status: Never   Smokeless tobacco: Never  Substance and Sexual Activity   Alcohol use: No   Drug use: No   Sexual activity: Not on file  Other Topics Concern   Not on file  Social History Narrative   Currently not working because of his hernia   Social Determinants of Health   Financial Resource Strain: Not on file  Food Insecurity: Not on file  Transportation Needs: Not on file  Physical Activity: Not on file  Stress: Not on file  Social Connections: Not on file  Intimate Partner Violence: Not on file    Physical Exam: Vital signs in last 24 hours: @BP  131/72   Pulse 97   Temp 98.6 F (37 C)   Ht 5\' 9"  (1.753 m)   Wt (!) 328 lb (148.8 kg)   SpO2 95%   BMI 48.44 kg/m  GEN: NAD EYE: Sclerae anicteric ENT: MMM CV: Non-tachycardic Pulm: CTA b/l GI: Soft, NT/ND NEURO:  Alert & Oriented x 3   Erick Blinks, MD [facility]  11/13/2022 1:31 PM

## 2022-11-13 NOTE — Progress Notes (Signed)
Vss nad trans to pacu 

## 2022-11-13 NOTE — Op Note (Signed)
[facility] Patient Name: Tyrone Fernandez Procedure Date: 11/13/2022 1:39 PM MRN: [id number] Endoscopist: Beverley Fiedler , MD, [id number] Age: 57 Referring MD:  Date of Birth: [birth year] Gender: Male Account #: [id number] Procedure:                Colonoscopy Indications:              Screening for colorectal malignant neoplasm Medicines:                Monitored Anesthesia Care Procedure:                Pre-Anesthesia Assessment:                           - Prior to the procedure, a History and Physical                            was performed, and patient medications and                            allergies were reviewed. The patient's tolerance of                            previous anesthesia was also reviewed. The risks                            and benefits of the procedure and the sedation                            options and risks were discussed with the patient.                            All questions were answered, and informed consent                            was obtained. Prior Anticoagulants: The patient has                            taken no anticoagulant or antiplatelet agents. ASA                            Grade Assessment: III - A patient with severe                            systemic disease. After reviewing the risks and                            benefits, the patient was deemed in satisfactory                            condition to undergo the procedure.                           After obtaining informed consent, the colonoscope  was passed under direct vision. Throughout the                            procedure, the patient's blood pressure, pulse, and                            oxygen saturations were monitored continuously. The                            CF [id number] #[id number] was introduced through the anus                            and advanced to the cecum, identified by the                            ileocecal valve.  The colonoscopy was performed                            without difficulty. The patient tolerated the                            procedure well. The quality of the bowel                            preparation was good. The ileocecal valve,                            appendiceal orifice, and rectum were photographed. Scope In: 1:46:44 PM Scope Out: 2:00:54 PM Scope Withdrawal Time: 0 hours 12 minutes 29 seconds  Total Procedure Duration: 0 hours 14 minutes 10 seconds  Findings:                 The digital rectal exam was normal.                           A 4 mm polyp was found in the cecum. The polyp was                            sessile. The polyp was removed with a cold snare.                            Resection and retrieval were complete.                           Two sessile polyps were found in the ascending                            colon. The polyps were 2 to 5 mm in size. These                            polyps were removed with a cold snare. Resection  and retrieval were complete.                           A 7 mm polyp was found in the descending colon. The                            polyp was sessile. The polyp was removed with a                            cold snare. Resection and retrieval were complete.                           There was evidence of a prior end-to-end                            colo-colonic anastomosis in the sigmoid colon. This                            was patent and was characterized by healthy                            appearing mucosa.                           Internal hemorrhoids were found during                            retroflexion. The hemorrhoids were small. Complications:            No immediate complications. Estimated Blood Loss:     Estimated blood loss was minimal. Impression:               - One 4 mm polyp in the cecum, removed with a cold                            snare. Resected and retrieved.                            - Two 2 to 5 mm polyps in the ascending colon,                            removed with a cold snare. Resected and retrieved.                           - One 7 mm polyp in the descending colon, removed                            with a cold snare. Resected and retrieved.                           - Patent end-to-end colo-colonic anastomosis in                            sigmoid, characterized by healthy appearing mucosa.                           -  Internal hemorrhoids. Recommendation:           - Patient has a contact number available for                            emergencies. The signs and symptoms of potential                            delayed complications were discussed with the                            patient. Return to normal activities tomorrow.                            Written discharge instructions were provided to the                            patient.                           - Resume previous diet.                           - Continue present medications.                           - Await pathology results.                           - Repeat colonoscopy is recommended. The                            colonoscopy date will be determined after pathology                            results from today's exam become available for                            review. Beverley Fiedler, MD 11/13/2022 2:04:09 PM This report has been signed electronically.

## 2022-11-13 NOTE — Patient Instructions (Signed)
Handouts Provided:  Polyps  YOU HAD AN ENDOSCOPIC PROCEDURE TODAY AT THE [facility]:   Refer to the procedure report that was given to you for any specific questions about what was found during the examination.  If the procedure report does not answer your questions, please call your gastroenterologist to clarify.  If you requested that your care partner not be given the details of your procedure findings, then the procedure report has been included in a sealed envelope for you to review at your convenience later.  YOU SHOULD EXPECT: Some feelings of bloating in the abdomen. Passage of more gas than usual.  Walking can help get rid of the air that was put into your GI tract during the procedure and reduce the bloating. If you had a lower endoscopy (such as a colonoscopy or flexible sigmoidoscopy) you may notice spotting of blood in your stool or on the toilet paper. If you underwent a bowel prep for your procedure, you may not have a normal bowel movement for a few days.  Please Note:  You might notice some irritation and congestion in your nose or some drainage.  This is from the oxygen used during your procedure.  There is no need for concern and it should clear up in a day or so.  SYMPTOMS TO REPORT IMMEDIATELY:  Following lower endoscopy (colonoscopy or flexible sigmoidoscopy):  Excessive amounts of blood in the stool  Significant tenderness or worsening of abdominal pains  Swelling of the abdomen that is new, acute  Fever of 100F or higher  For urgent or emergent issues, a gastroenterologist can be reached at any hour by calling [phone]. Do not use MyChart messaging for urgent concerns.    DIET:  We do recommend a small meal at first, but then you may proceed to your regular diet.  Drink plenty of fluids but you should avoid alcoholic beverages for 24 hours.  ACTIVITY:  You should plan to take it easy for the rest of today and you should NOT DRIVE or use heavy  machinery until tomorrow (because of the sedation medicines used during the test).    FOLLOW UP: Our staff will call the number listed on your records the next business day following your procedure.  We will call around 7:15- 8:00 am to check on you and address any questions or concerns that you may have regarding the information given to you following your procedure. If we do not reach you, we will leave a message.     If any biopsies were taken you will be contacted by phone or by letter within the next 1-3 weeks.  Please call us at [phone] if you have not heard about the biopsies in 3 weeks.    SIGNATURES/CONFIDENTIALITY: You and/or your care partner have signed paperwork which will be entered into your electronic medical record.  These signatures attest to the fact that that the information above on your After Visit Summary has been reviewed and is understood.  Full responsibility of the confidentiality of this discharge information lies with you and/or your care-partner.  

## 2022-11-14 ENCOUNTER — Telehealth: Payer: Self-pay | Admitting: *Deleted

## 2022-11-14 NOTE — Telephone Encounter (Signed)
No answer on  follow up call. Left message.   

## 2022-11-18 ENCOUNTER — Encounter: Payer: Self-pay | Admitting: Internal Medicine

## 2023-01-26 ENCOUNTER — Telehealth: Payer: Self-pay

## 2023-01-26 DIAGNOSIS — M5416 Radiculopathy, lumbar region: Secondary | ICD-10-CM

## 2023-01-26 NOTE — Addendum Note (Signed)
Addended by: Ashok Norris on: 01/26/2023 01:05 PM   Modules accepted: Orders

## 2023-01-26 NOTE — Telephone Encounter (Signed)
Patient called the triage line stating he is needing to schedule appt for ESI. Notified him the PA we had previously obtained had expired. Patient now wishes to proceed with ESI. Does he need to come back in for OV or can we just get PA again on previous referral? Please call patient to advise [phone]

## 2023-02-19 ENCOUNTER — Ambulatory Visit: Payer: Medicaid Other | Admitting: Physical Medicine and Rehabilitation

## 2023-02-19 ENCOUNTER — Other Ambulatory Visit: Payer: Self-pay

## 2023-02-19 VITALS — BP 137/87 | HR 94

## 2023-02-19 DIAGNOSIS — M5414 Radiculopathy, thoracic region: Secondary | ICD-10-CM

## 2023-02-19 MED ORDER — METHYLPREDNISOLONE ACETATE 80 MG/ML IJ SUSP: 80.0000 mg | Freq: Once | INTRAMUSCULAR | Status: DC

## 2023-02-19 NOTE — Patient Instructions (Signed)

## 2023-02-19 NOTE — Progress Notes (Signed)
Functional Pain Scale - descriptive words and definitions  Distracting (5)    Aware of pain/able to complete some ADL's but limited by pain/sleep is affected and active distractions are only slightly useful. Moderate range order  Average Pain 4 but can get higher   +Driver, -BT, -Dye Allergies.  Middle back pain on left side. Pain when trying to walk or stand more than 10 minutes

## 2023-02-24 ENCOUNTER — Ambulatory Visit (INDEPENDENT_AMBULATORY_CARE_PROVIDER_SITE_OTHER): Payer: Medicaid Other | Admitting: Physical Medicine and Rehabilitation

## 2023-02-24 ENCOUNTER — Encounter: Payer: Self-pay | Admitting: Physical Medicine and Rehabilitation

## 2023-02-24 DIAGNOSIS — G8929 Other chronic pain: Secondary | ICD-10-CM | POA: Diagnosis not present

## 2023-02-24 DIAGNOSIS — M5416 Radiculopathy, lumbar region: Secondary | ICD-10-CM

## 2023-02-24 DIAGNOSIS — M419 Scoliosis, unspecified: Secondary | ICD-10-CM

## 2023-02-24 DIAGNOSIS — M5442 Lumbago with sciatica, left side: Secondary | ICD-10-CM

## 2023-02-24 NOTE — Progress Notes (Signed)
Functional Pain Scale - descriptive words and definitions  Uncomfortable (3)  Pain is present but can complete all ADL's/sleep is slightly affected and passive distraction only gives marginal relief. Mild range order  Average Pain  varies, pain comes and goes  Lower back pain. Pain mostly when standing for long periods of time

## 2023-02-24 NOTE — Progress Notes (Signed)
Tyrone Fernandez - 57 y.o. male MRN [id number]  Date of birth: 24-Feb-1966  Office Visit Note: Visit Date: 02/24/2023 PCP: Quita Skye, PA-C Referred by: Quita Skye, PA-C  Subjective: Chief Complaint  Patient presents with   Lower Back - Pain   HPI: Tyrone Fernandez is a 57 y.o. male who comes in today for evaluation of chronic, worsening and severe left sided lower back pain radiating to left groin/testicle. Pain ongoing for several years, worsens with standing. He describes pain as sore and aching, currently rates as 8 out of 10. Some relief of pain with home exercise regimen, rest and use of medications.  Recent lumbar MRI imaging exhibits thoracic kyphosis centered at T11, severe levoscoliosis of the thoracolumbar spine. Mild multi level facet arthropathy. No foraminal or central canal stenosis noted. Patient recently underwent left T12-L1 interlaminar epidural steroid injection in our office on 02/19/2023, unfortunately this injection was unsuccessful and difficult for patient to tolerate. Dr. Mathews Blas notes can be reviewed in EPIC. 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 States he does not want to file for disability but is considering. States he is currently helping to take care of his father as he was recently placed in Hospice. Patient denies focal weakness, numbness and tingling. No recent trauma or falls.    Review of Systems  Musculoskeletal:  Positive for back pain.  Neurological:  Negative for tingling, sensory change, focal weakness and weakness.  All other systems reviewed and are negative.  Otherwise per HPI.  Assessment & Plan: Visit Diagnoses:    ICD-10-CM   1. Chronic left-sided low back pain with left-sided sciatica  M54.42    G89.29     2. Lumbar radiculopathy  M54.16     3. Scoliosis of thoracolumbar spine, unspecified scoliosis type  M41.9        Plan: Findings:  Chronic, worsening and  severe left sided lower back pain radiating to left groin/testicle. Patient continues to have severe pain despite good conservative therapies such as home exercise regimen, rest and use of medications. Patients clinical presentation and exam are consistent with L1 nerve pattern. There is severe levoscoliosis of the thoracolumbar spine and 3 mm retrolisthesis of L1 on L2. I discussed treatment plan with patient today, recommend diagnostic and hopefully therapeutic left L1 transforaminal epidural steroid injection under fluoroscopic guidance. Patient would like to hold on this injection at this time as he feels prior injection was difficult to tolerate, he is also under heavy stress due to his father being placed in hospice.   Patient had several questions concerning origin of his pain, lower lumbar spine on recent MRI imaging looks good, no nerve impingement, no advanced facet arthropathy, no severe central stenosis. He does have severe levoscoliosis higher up, however no pain at this level anatomically. His pain radiates to left groin and fits more with L1 distribution. I informed patient  injections are performed diagnostically and would be recommend trying transforaminal approach next. He will take some time to think about options. He could always consult with surgeon regarding scoliosis and to discuss disability. No red flag symptoms noted upon exam today.      Meds & Orders: No orders of the defined types were placed in this encounter.  No orders of the defined types were placed in this encounter.   Follow-up: Return if symptoms worsen or fail to improve.   Procedures: No procedures performed  Clinical History: EXAM: MRI LUMBAR SPINE WITHOUT CONTRAST   TECHNIQUE: Multiplanar, multisequence MR imaging of the lumbar spine was performed. No intravenous contrast was administered.   COMPARISON:  None Available.   FINDINGS: Segmentation:  Standard.   Alignment: Thoracic kyphosis  centered at T11. Severe levoscoliosis of the thoracolumbar spine. 3 mm retrolisthesis of L1 on L2.   Vertebrae: No acute fracture, evidence of discitis, or aggressive bone lesion.   Conus medullaris and cauda equina: Conus extends to the L1 level. Conus and cauda equina appear normal.   Paraspinal and other soft tissues: No acute paraspinal abnormality.   Disc levels:   Disc spaces: Degenerative disease with disc height loss T10-11, T11-12, T12-L1 and L1-2.   T11-12: Mild broad-based disc bulge. Mild left foraminal stenosis. No right foraminal stenosis. No spinal stenosis.   T12-L1: Broad-based disc osteophyte complex. Mild left foraminal stenosis. No right foraminal stenosis. Mild bilateral facet arthropathy. No spinal stenosis.   L1-L2: Mild broad-based disc bulge. Mild bilateral facet arthropathy. No foraminal or central canal stenosis.   L2-L3: No significant disc bulge. Mild bilateral facet arthropathy. No foraminal or central canal stenosis.   L3-L4: No significant disc bulge. Mild bilateral facet arthropathy. No foraminal or central canal stenosis.   L4-L5: No significant disc bulge. Mild bilateral facet arthropathy. No foraminal or central canal stenosis.   L5-S1: No significant disc bulge. Mild bilateral facet arthropathy. No foraminal or central canal stenosis.   IMPRESSION: 1. 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 Severe levoscoliosis of the thoracolumbar spine. 2. Mild lumbar spine spondylosis as described above. 3. No acute osseous injury of the lumbar spine.     Electronically Signed   By: Elige Ko M.D.   On: 09/01/2022 10:17   He reports that he has never smoked. He has never used smokeless tobacco. No results for input(s): "HGBA1C", "LABURIC" in the last 8760 hours.  Objective:  VS:  HT:    WT:   BMI:     BP:   HR: bpm  TEMP: ( )  RESP:  Physical Exam Vitals and nursing note reviewed.  HENT:     Head: Normocephalic and atraumatic.      Right Ear: External ear normal.     Left Ear: External ear normal.     Nose: Nose normal.     Mouth/Throat:     Mouth: Mucous membranes are moist.  Eyes:     Extraocular Movements: Extraocular movements intact.  Cardiovascular:     Rate and Rhythm: Normal rate.     Pulses: Normal pulses.  Pulmonary:     Effort: Pulmonary effort is normal.  Abdominal:     General: Abdomen is flat. There is no distension.  Musculoskeletal:        General: Tenderness present.     Cervical back: Normal range of motion.     Comments: Patient rises from seated position to standing without difficulty. Mild pain noted with facet loading. 5/5 strength noted with bilateral hip flexion, knee flexion/extension, ankle dorsiflexion/plantarflexion and EHL. No clonus noted bilaterally. No pain upon palpation of greater trochanters. No pain with internal/external rotation of bilateral hips. Sensation intact bilaterally. Negative slump test bilaterally. Dysesthesias noted to left L1 nerve pattern. Ambulates without aid, gait steady.     Skin:    General: Skin is warm and dry.     Capillary Refill: Capillary refill takes less than 2 seconds.  Neurological:     General: No focal deficit present.     Mental Status:  He is alert and oriented to person, place, and time.  Psychiatric:        Mood and Affect: Mood normal.        Behavior: Behavior normal.     Ortho Exam  Imaging: No results found.  Past Medical/Family/Surgical/Social History: Medications & Allergies reviewed per EMR, new medications updated. Patient Active Problem List   Diagnosis Date Noted   Ventral hernia with bowel obstruction 08/19/2021   Hernia of anterior abdominal wall    SBO (small bowel obstruction) (HCC) 04/08/2016   History of bronchitis 09/27/2014   Cough 09/27/2014   Obesity 09/27/2014   Past Medical History:  Diagnosis Date   Allergy    H/O hiatal hernia    History of kidney stones    SBO (small bowel obstruction) (HCC)  03/2016   Family History  Problem Relation Age of Onset   Crohn's disease Mother    Diabetes Mother    Cancer Mother        breast cancer    Stroke Maternal Aunt    Heart disease Paternal Grandfather    Colon cancer Neg Hx    Pancreatic cancer Neg Hx    Rectal cancer Neg Hx    Stomach cancer Neg Hx    Ulcerative colitis Neg Hx    Past Surgical History:  Procedure Laterality Date   1984  KNEE SURGERY     2007 OR 2008  KIDNEY STONE REMOVED     APPENDECTOMY  06/30/2001   DONE AT TIME OF COLON RESECTION   CHOLECYSTECTOMY  06/30/2008   AT SAME TIME OF HERNIA REPAIR   COLON SURGERY  06/30/2001   COLON RESECTION FOR DIVERTICULITIS / COLOSTOMY   COLOSTOMY     COLOSTOMY REVERSAL  06/30/2001   CYSTOSCOPY WITH URETEROSCOPY AND STENT PLACEMENT Right 05/03/2013   Procedure: CYSTOSCOPY, RIGHT RETROGRADE PYELOGRAMRIGHT URETEROSCOPY, STONE EXTRACTION  AND STENT PLACEMENT;  Surgeon: Antony Haste, MD;  Location: [facility];  Service: Urology;  Laterality: Right;   HERNIA REPAIR  06/30/2008   POLYPECTOMY     VENTRAL HERNIA REPAIR N/A 08/19/2021   Procedure: OPEN HERNIA REPAIR VENTRAL ADULT;  Surgeon: Abigail Miyamoto, MD;  Location: [facility];  Service: General;  Laterality: N/A;   Social History   Occupational History   Not on file  Tobacco Use   Smoking status: Never   Smokeless tobacco: Never  Substance and Sexual Activity   Alcohol use: No   Drug use: No   Sexual activity: Not on file

## 2023-02-28 NOTE — Procedures (Signed)
Lumbar Epidural Steroid Injection - Interlaminar Approach with Fluoroscopic Guidance  Patient: Tyrone Fernandez      Date of Birth: September 23, 1965 MRN: [id number] PCP: Quita Skye, PA-C      Visit Date: 02/19/2023   Universal Protocol:     Consent Given By: the patient  Position: PRONE  Additional Comments: Vital signs were monitored before and after the procedure. Patient was prepped and draped in the usual sterile fashion. The correct patient, procedure, and site was verified.   Injection Procedure Details:   Procedure diagnoses: Thoracic radiculopathy [M54.14]   Meds Administered:  Meds ordered this encounter  Medications   DISCONTD: methylPREDNISolone acetate (DEPO-MEDROL) injection 80 mg     Laterality: Left  Location/Site:  T12-L1  Needle: 3.5 in., 20 ga. Tuohy  Needle Placement: Paramedian epidural  Findings:   -Comments: Patient with significant scoliotic deformity and kyphotic deformity in the lower thoracic region.  Fluoroscope was aligned fairly well to be able to see the endplate squared up at T12.  Unfortunately with patient's body habitus and angle of curvature we were just unable to get the needle into the epidural space.  He tolerated fairly well but was uncomfortable laying on his stomach and then was just uncomfortable after a while because it was taking longer than normal.  We completed approximately 90% of the injection we just could not deliver injectate to the epidural space.  Consider transforaminal approach.  Procedure Details: Using a paramedian approach from the side mentioned above, the region overlying the inferior lamina was localized under fluoroscopic visualization and the soft tissues overlying this structure were infiltrated with 4 ml. of 1% Lidocaine without Epinephrine. The Tuohy needle was inserted into the epidural space using a paramedian approach.   The epidural space was localized using loss of resistance along with counter oblique  bi-planar fluoroscopic views.  After negative aspirate for air, blood, and CSF, a 2 ml. volume of Isovue-250 was injected into the epidural space and the flow of contrast was observed. Radiographs were obtained for documentation purposes.    The injectate was administered into the level noted above.   Additional Comments:   Dressing: 2 x 2 sterile gauze and Band-Aid    Post-procedure details: Patient was observed during the procedure. Post-procedure instructions were reviewed.  Patient left the clinic in stable condition.

## 2023-02-28 NOTE — Progress Notes (Signed)
Tyrone Fernandez - 57 y.o. male MRN [id number]  Date of birth: 11-04-1965  Office Visit Note: Visit Date: 02/19/2023 PCP: Quita Skye, PA-C Referred by: Quita Skye, PA-C  Subjective: Chief Complaint  Patient presents with   Middle Back - Pain   HPI:  Tyrone Fernandez is a 57 y.o. male who comes in today at the request of Tyrone Goodie, FNP for planned Left T12-L1 Lumbar Interlaminar epidural steroid injection with fluoroscopic guidance.  The patient has failed conservative care including home exercise, medications, time and activity modification.  This injection will be diagnostic and hopefully therapeutic.  Please see requesting physician notes for further details and justification.   ROS Otherwise per HPI.  Assessment & Plan: Visit Diagnoses:    ICD-10-CM   1. Thoracic radiculopathy  M54.14 XR C-ARM NO REPORT    Epidural Steroid injection    DISCONTINUED: methylPREDNISolone acetate (DEPO-MEDROL) injection 80 mg      Plan: No additional findings.   Meds & Orders:  Meds ordered this encounter  Medications   DISCONTD: methylPREDNISolone acetate (DEPO-MEDROL) injection 80 mg    Orders Placed This Encounter  Procedures   XR C-ARM NO REPORT   Epidural Steroid injection    Follow-up: Return in about 1 week (around 02/26/2023).   Procedures: No procedures performed  Lumbar Epidural Steroid Injection - Interlaminar Approach with Fluoroscopic Guidance  Patient: Tyrone Fernandez      Date of Birth: [birth year] MRN: [id number] PCP: Quita Skye, PA-C      Visit Date: 02/19/2023   Universal Protocol:     Consent Given By: the patient  Position: PRONE  Additional Comments: Vital signs were monitored before and after the procedure. Patient was prepped and draped in the usual sterile fashion. The correct patient, procedure, and site was verified.   Injection Procedure Details:   Procedure diagnoses: Thoracic radiculopathy [M54.14]   Meds Administered:  Meds  ordered this encounter  Medications   DISCONTD: methylPREDNISolone acetate (DEPO-MEDROL) injection 80 mg     Laterality: Left  Location/Site:  T12-L1  Needle: 3.5 in., 20 ga. Tuohy  Needle Placement: Paramedian epidural  Findings:   -Comments: Patient with significant scoliotic deformity and kyphotic deformity in the lower thoracic region.  Fluoroscope was aligned fairly well to be able to see the endplate squared up at T12.  Unfortunately with patient's body habitus and angle of curvature we were just unable to get the needle into the epidural space.  He tolerated fairly well but was uncomfortable laying on his stomach and then was just uncomfortable after a while because it was taking longer than normal.  We completed approximately 90% of the injection we just could not deliver injectate to the epidural space.  Consider transforaminal approach.  Procedure Details: Using a paramedian approach from the side mentioned above, the region overlying the inferior lamina was localized under fluoroscopic visualization and the soft tissues overlying this structure were infiltrated with 4 ml. of 1% Lidocaine without Epinephrine. The Tuohy needle was inserted into the epidural space using a paramedian approach.   The epidural space was localized using loss of resistance along with counter oblique bi-planar fluoroscopic views.  After negative aspirate for air, blood, and CSF, a 2 ml. volume of Isovue-250 was injected into the epidural space and the flow of contrast was observed. Radiographs were obtained for documentation purposes.    The injectate was administered into the level noted above.   Additional Comments:   Dressing: 2 x 2 sterile gauze  and Band-Aid    Post-procedure details: Patient was observed during the procedure. Post-procedure instructions were reviewed.  Patient left the clinic in stable condition.   Clinical History: EXAM: MRI LUMBAR SPINE WITHOUT CONTRAST    TECHNIQUE: Multiplanar, multisequence MR imaging of the lumbar spine was performed. No intravenous contrast was administered.   COMPARISON:  None Available.   FINDINGS: Segmentation:  Standard.   Alignment: Thoracic kyphosis centered at T11. Severe levoscoliosis of the thoracolumbar spine. 3 mm retrolisthesis of L1 on L2.   Vertebrae: No acute fracture, evidence of discitis, or aggressive bone lesion.   Conus medullaris and cauda equina: Conus extends to the L1 level. Conus and cauda equina appear normal.   Paraspinal and other soft tissues: No acute paraspinal abnormality.   Disc levels:   Disc spaces: Degenerative disease with disc height loss T10-11, T11-12, T12-L1 and L1-2.   T11-12: Mild broad-based disc bulge. Mild left foraminal stenosis. No right foraminal stenosis. No spinal stenosis.   T12-L1: Broad-based disc osteophyte complex. Mild left foraminal stenosis. No right foraminal stenosis. Mild bilateral facet arthropathy. No spinal stenosis.   L1-L2: Mild broad-based disc bulge. Mild bilateral facet arthropathy. No foraminal or central canal stenosis.   L2-L3: No significant disc bulge. Mild bilateral facet arthropathy. No foraminal or central canal stenosis.   L3-L4: No significant disc bulge. Mild bilateral facet arthropathy. No foraminal or central canal stenosis.   L4-L5: No significant disc bulge. Mild bilateral facet arthropathy. No foraminal or central canal stenosis.   L5-S1: No significant disc bulge. Mild bilateral facet arthropathy. No foraminal or central canal stenosis.   IMPRESSION: 1. 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 Severe levoscoliosis of the thoracolumbar spine. 2. Mild lumbar spine spondylosis as described above. 3. No acute osseous injury of the lumbar spine.     Electronically Signed   By: Tyrone Fernandez M.D.   On: 09/01/2022 10:17     Objective:  VS:  HT:    WT:   BMI:     BP:137/87  HR:94bpm  TEMP: ( )  RESP:   Physical Exam Vitals and nursing note reviewed.  Constitutional:      General: He is not in acute distress.    Appearance: Normal appearance. He is obese. He is not ill-appearing.  HENT:     Head: Normocephalic and atraumatic.     Right Ear: External ear normal.     Left Ear: External ear normal.     Nose: No congestion.  Eyes:     Extraocular Movements: Extraocular movements intact.  Cardiovascular:     Rate and Rhythm: Normal rate.     Pulses: Normal pulses.  Pulmonary:     Effort: Pulmonary effort is normal. No respiratory distress.  Abdominal:     General: There is no distension.     Palpations: Abdomen is soft.  Musculoskeletal:        General: No tenderness or signs of injury.     Cervical back: Neck supple.     Right lower leg: No edema.     Left lower leg: No edema.     Comments: Patient has good distal strength without clonus.  Skin:    Findings: No erythema or rash.  Neurological:     General: No focal deficit present.     Mental Status: He is alert and oriented to person, place, and time.     Sensory: No sensory deficit.     Motor: No weakness or abnormal muscle tone.  Coordination: Coordination normal.  Psychiatric:        Mood and Affect: Mood normal.        Behavior: Behavior normal.      Imaging: No results found.

## 2024-03-25 ENCOUNTER — Ambulatory Visit
Admission: RE | Admit: 2024-03-25 | Discharge: 2024-03-25 | Disposition: A | Discharge status: Home / Self Care | Source: Ambulatory Visit | Attending: Nephrology | Admitting: Nephrology

## 2024-03-25 ENCOUNTER — Other Ambulatory Visit: Payer: Self-pay | Admitting: Nephrology

## 2024-03-25 DIAGNOSIS — R0602 Shortness of breath: Secondary | ICD-10-CM

## 2024-03-25 DIAGNOSIS — R600 Localized edema: Secondary | ICD-10-CM

## 2024-04-15 NOTE — Progress Notes (Signed)
 Subjective   Tyrone Fernandez is a 58 year old male here for Dizziness (Patient in today due to some dizziness after starting on Maxzide. When he was taking 1/2 a tablet he said the dizziness isn't as bad but it gets worse when he was on the whole pill. He took the Maxzide last night about 10pm but has not had any this morning.) and Nasal Congestion (Patient reports he started having increased sinus pressure yesterday.)   Tyrone Fernandez reports he has been experiencing dizziness after taking Maxide. He reports he drinks about 4 sodas a day and he does not drink any water. He reports it is hard to drink water due to the taste. He reports his blood has dropped when he felt dizzy. He reports he has been wanting to cut the dose in half to see if that would help.  Tyrone Fernandez report sinus symptoms since he came from out of town from [place]. He reports nose and head felt stuffy. He denies any symptoms during this visit.   Documentation Reviewed by Any User: Allergies  Meds     Current Outpatient Medications  Medication Sig Dispense Refill   fluticasone  (FLONASE ) 50 mcg/actuation nasal spray Place 1 Spray in both nostrils once daily. 16 g 1   triamterene-hydrochlorothiazid (MAXZIDE) 75-50 mg per tablet Take 1 Tablet by mouth once daily. 90 Tablet 1   albuterol  HFA (VENTOLIN  HFA) 90 mcg/actuation inhaler Inhale 2 Puffs into the lungs every 4 (four) hours as needed for shortness of breath or wheezing (Patient not taking: Reported on 03/14/2024.) 8 g 1   Review of Systems  HENT:  Positive for sinus pressure.   Neurological:  Positive for dizziness and headaches.  All other systems reviewed and are negative.    Objective   BP 138/84 (Left Arm, Sitting, Large Adult) (Manual Re-check)  Pulse (!) 115  Temp 97.6 F (36.4 C)  Ht 5' 9 (1.753 m)  Wt (!) 326 lb (147.9 kg)  SpO2 96%  BMI 48.14 kg/m  Smoking Status Never  BSA 2.68 m  Physical Exam Vitals and nursing note reviewed.   Constitutional:       Appearance: Normal appearance. Woodie has obesity HENT:     Head: Normocephalic and atraumatic.     Right Ear: Tympanic membrane, ear canal and external ear normal.     Left Ear: Tympanic membrane, ear canal and external ear normal.     Nose: Nose normal.     Mouth/Throat:     Mouth: Mucous membranes are moist.     Comments: Pharynx and tonsils are normal.  Cardiovascular:     Rate and Rhythm: Normal rate and regular rhythm.     Pulses: Normal pulses.     Heart sounds: Normal heart sounds.  Pulmonary:     Effort: Pulmonary effort is normal.     Breath sounds: Normal breath sounds.  Musculoskeletal:        General: Normal range of motion.     Cervical back: Normal range of motion.  Skin:    General: Skin is warm and dry.  Neurological:     General: No focal deficit present.     Mental Status: Tyrone Fernandez is alert.  Psychiatric:        Mood and Affect: Mood normal.        Behavior: Behavior normal.        Thought Content: Thought content normal.        Judgment: Judgment normal.     Assessment and Plan  1. Dizziness -Educated patient on continuing Maxide as ordered until appointment with Dr. Perri. -Educated patient on changing positions slowly. -Educated patient on increasing water intake and cut back soda intake.  2. Primary hypertension  3. Morbidly obese  Return in about 3 days (around 04/18/2024) for Dr. Perri.   Tyrone Pounds MSN,  FNP-BC, APRN Family Nurse Practitioner [facility]

## 2024-04-18 NOTE — Progress Notes (Signed)
 Subjective HPI  Seen recently and today for symptoms related to HCTZ administration. Hge has had increased palpitations and malaise and some dizziness.  Weight has dropped with associated diuresis.  Edema is less. BP improved.  Breathing maybe a little better.  Noticeable tachycardia but had it before diuretic. C/o productive cough and congestion.    Objective  Vitals:   04/18/24 1135  BP: 128/86  Pulse: (!) 114  Temp: 98.2 F (36.8 C)  TempSrc: Oral  SpO2: 95%  Weight: (!) 328 lb 9.6 oz (149.1 kg)  Height: 5' 9 (1.753 m)   Last 3 Vitals    Flowsheet Row Office Visit from 04/18/2024 in [place] at [facility] Office Visit from 04/15/2024 in [place] at [facility] Office Visit from 03/14/2024 in [facility] at [facility]  Temp 98.2 F (36.8 C) 97.6 F (36.4 C) 98.3 F (36.8 C)  Pulse 114* 115* 104*  BP 128/86 138/84 * [Manual Re-check] 140/100*  Resp -- -- 16  Weight 328 lb 9.6 oz (149.1 kg)* 326 lb (147.9 kg)* 333 lb 12.8 oz (151.4 kg)*    Estimated body mass index is 48.53 kg/m as calculated from the following:   Height as of this encounter: 5' 9 (1.753 m).   Weight as of this encounter: 328 lb 9.6 oz (149.1 kg). Facility age limit for growth %iles is 20 years.   Physical Exam WAM Obese  Cor tachy Clear lungs  Tr edema BLE  Assessment and Plan 1. Tachycardia Comments: Predated HCTZ,  but excacerbated. Check Thyroid and Hgb - FE+CBC/D/PLT/TIBC/FER/RETIC Routine - TSH + FREE T4 Routine  2. Primary hypertension Comments: Improved due to diuresis  3. Bronchitis Comments: Destires more aggressive treatment than OTC meds - amoxicillin-pot clavulanate (AUGMENTIN) 875-125 mg per tablet; Take 1 Tablet by mouth 2 (two) times daily for 5 days.  Dispense: 10 Tablet; Refill: 0  4. Morbidly obese Comments: Will discuss poss GLp1 after sleep study results        *Some images could not be shown.

## 2024-06-26 ENCOUNTER — Encounter (HOSPITAL_COMMUNITY): Payer: Self-pay

## 2024-06-26 ENCOUNTER — Emergency Department (HOSPITAL_COMMUNITY)

## 2024-06-26 ENCOUNTER — Ambulatory Visit (HOSPITAL_COMMUNITY)
Admission: RE | Admit: 2024-06-26 | Discharge: 2024-06-26 | Disposition: A | Discharge status: Home / Self Care | Payer: Self-pay | Source: Ambulatory Visit

## 2024-06-26 ENCOUNTER — Other Ambulatory Visit: Payer: Self-pay

## 2024-06-26 ENCOUNTER — Inpatient Hospital Stay (HOSPITAL_COMMUNITY)
Admission: EM | Admit: 2024-06-26 | Discharge: 2024-07-01 | DRG: 871 | Disposition: A | Discharge status: Home / Self Care | Source: Ambulatory Visit | Attending: Family Medicine | Admitting: Family Medicine

## 2024-06-26 ENCOUNTER — Ambulatory Visit (HOSPITAL_COMMUNITY)

## 2024-06-26 VITALS — BP 78/57 | HR 140 | Temp 100.5°F | Resp 30

## 2024-06-26 DIAGNOSIS — Z823 Family history of stroke: Secondary | ICD-10-CM

## 2024-06-26 DIAGNOSIS — A419 Sepsis, unspecified organism: Principal | ICD-10-CM | POA: Diagnosis present

## 2024-06-26 DIAGNOSIS — N179 Acute kidney failure, unspecified: Secondary | ICD-10-CM | POA: Diagnosis present

## 2024-06-26 DIAGNOSIS — E876 Hypokalemia: Secondary | ICD-10-CM | POA: Diagnosis present

## 2024-06-26 DIAGNOSIS — I10 Essential (primary) hypertension: Secondary | ICD-10-CM | POA: Diagnosis present

## 2024-06-26 DIAGNOSIS — A084 Viral intestinal infection, unspecified: Secondary | ICD-10-CM | POA: Diagnosis present

## 2024-06-26 DIAGNOSIS — R509 Fever, unspecified: Secondary | ICD-10-CM

## 2024-06-26 DIAGNOSIS — R000 Tachycardia, unspecified: Secondary | ICD-10-CM

## 2024-06-26 DIAGNOSIS — J9601 Acute respiratory failure with hypoxia: Secondary | ICD-10-CM | POA: Diagnosis present

## 2024-06-26 DIAGNOSIS — Z803 Family history of malignant neoplasm of breast: Secondary | ICD-10-CM

## 2024-06-26 DIAGNOSIS — R54 Age-related physical debility: Secondary | ICD-10-CM | POA: Diagnosis present

## 2024-06-26 DIAGNOSIS — K760 Fatty (change of) liver, not elsewhere classified: Secondary | ICD-10-CM | POA: Diagnosis present

## 2024-06-26 DIAGNOSIS — J09X1 Influenza due to identified novel influenza A virus with pneumonia: Secondary | ICD-10-CM | POA: Insufficient documentation

## 2024-06-26 DIAGNOSIS — J1008 Influenza due to other identified influenza virus with other specified pneumonia: Secondary | ICD-10-CM | POA: Diagnosis present

## 2024-06-26 DIAGNOSIS — E559 Vitamin D deficiency, unspecified: Secondary | ICD-10-CM | POA: Diagnosis present

## 2024-06-26 DIAGNOSIS — N132 Hydronephrosis with renal and ureteral calculous obstruction: Secondary | ICD-10-CM | POA: Diagnosis present

## 2024-06-26 DIAGNOSIS — Z8249 Family history of ischemic heart disease and other diseases of the circulatory system: Secondary | ICD-10-CM

## 2024-06-26 DIAGNOSIS — Z833 Family history of diabetes mellitus: Secondary | ICD-10-CM

## 2024-06-26 DIAGNOSIS — R6520 Severe sepsis without septic shock: Secondary | ICD-10-CM | POA: Diagnosis present

## 2024-06-26 DIAGNOSIS — E872 Acidosis, unspecified: Secondary | ICD-10-CM | POA: Diagnosis present

## 2024-06-26 DIAGNOSIS — J159 Unspecified bacterial pneumonia: Secondary | ICD-10-CM | POA: Diagnosis present

## 2024-06-26 DIAGNOSIS — N281 Cyst of kidney, acquired: Secondary | ICD-10-CM | POA: Diagnosis present

## 2024-06-26 DIAGNOSIS — J189 Pneumonia, unspecified organism: Secondary | ICD-10-CM | POA: Insufficient documentation

## 2024-06-26 DIAGNOSIS — Z9049 Acquired absence of other specified parts of digestive tract: Secondary | ICD-10-CM

## 2024-06-26 DIAGNOSIS — E871 Hypo-osmolality and hyponatremia: Secondary | ICD-10-CM | POA: Diagnosis present

## 2024-06-26 DIAGNOSIS — J690 Pneumonitis due to inhalation of food and vomit: Principal | ICD-10-CM

## 2024-06-26 HISTORY — DX: Essential (primary) hypertension: I10

## 2024-06-26 LAB — COMPREHENSIVE METABOLIC PANEL WITH GFR
ALT: 28 U/L (ref 0–44)
AST: 41 U/L (ref 15–41)
Albumin: 3.1 g/dL — ABNORMAL LOW (ref 3.5–5.0)
Alkaline Phosphatase: 65 U/L (ref 38–126)
Anion gap: 14 (ref 5–15)
BUN: 14 mg/dL (ref 6–20)
CO2: 20 mmol/L — ABNORMAL LOW (ref 22–32)
Calcium: 7.7 mg/dL — ABNORMAL LOW (ref 8.9–10.3)
Chloride: 100 mmol/L (ref 98–111)
Creatinine, Ser: 1.2 mg/dL (ref 0.61–1.24)
GFR, Estimated: >60 mL/min
Glucose, Bld: 119 mg/dL — ABNORMAL HIGH (ref 70–99)
Potassium: 3.2 mmol/L — ABNORMAL LOW (ref 3.5–5.1)
Sodium: 134 mmol/L — ABNORMAL LOW (ref 135–145)
Total Bilirubin: 0.6 mg/dL (ref 0.0–1.2)
Total Protein: 6.7 g/dL (ref 6.5–8.1)

## 2024-06-26 LAB — CBC WITH DIFFERENTIAL/PLATELET
Abs Immature Granulocytes: 0.19 K/uL — ABNORMAL HIGH (ref 0.00–0.07)
Basophils Absolute: 0 K/uL (ref 0.0–0.1)
Basophils Relative: 0 %
Eosinophils Absolute: 0 K/uL (ref 0.0–0.5)
Eosinophils Relative: 0 %
HCT: 39.8 % (ref 39.0–52.0)
Hemoglobin: 13 g/dL (ref 13.0–17.0)
Immature Granulocytes: 1 %
Lymphocytes Relative: 3 %
Lymphs Abs: 0.6 K/uL — ABNORMAL LOW (ref 0.7–4.0)
MCH: 27.7 pg (ref 26.0–34.0)
MCHC: 32.7 g/dL (ref 30.0–36.0)
MCV: 84.9 fL (ref 80.0–100.0)
Monocytes Absolute: 0.6 K/uL (ref 0.1–1.0)
Monocytes Relative: 4 %
Neutro Abs: 17 K/uL — ABNORMAL HIGH (ref 1.7–7.7)
Neutrophils Relative %: 92 %
Platelets: 219 K/uL (ref 150–400)
RBC: 4.69 MIL/uL (ref 4.22–5.81)
RDW: 15 % (ref 11.5–15.5)
WBC: 18.5 K/uL — ABNORMAL HIGH (ref 4.0–10.5)
nRBC: 0 % (ref 0.0–0.2)

## 2024-06-26 LAB — I-STAT CG4 LACTIC ACID, ED: Lactic Acid, Venous: 2.2 mmol/L (ref 0.5–1.9)

## 2024-06-26 LAB — POC COVID19/FLU A&B COMBO
Covid Antigen, POC: NEGATIVE
Influenza A Antigen, POC: POSITIVE — AB
Influenza B Antigen, POC: NEGATIVE

## 2024-06-26 LAB — PROTIME-INR
INR: 1.1 (ref 0.8–1.2)
Prothrombin Time: 14.9 s (ref 11.4–15.2)

## 2024-06-26 LAB — LIPASE, BLOOD: Lipase: 20 U/L (ref 11–51)

## 2024-06-26 MED ORDER — IOHEXOL 350 MG/ML SOLN
75.0000 mL | Freq: Once | INTRAVENOUS | Status: AC | PRN
  Administered 2024-06-26: 75 mL via INTRAVENOUS

## 2024-06-26 MED ORDER — SODIUM CHLORIDE 0.9 % IV SOLN: Freq: Once | INTRAVENOUS | Status: AC

## 2024-06-26 MED ORDER — IPRATROPIUM-ALBUTEROL 0.5-2.5 (3) MG/3ML IN SOLN
3.0000 mL | Freq: Once | RESPIRATORY_TRACT | Status: AC
  Administered 2024-06-26: 3 mL via RESPIRATORY_TRACT

## 2024-06-26 MED ORDER — IPRATROPIUM-ALBUTEROL 0.5-2.5 (3) MG/3ML IN SOLN
RESPIRATORY_TRACT | Status: AC
  Filled 2024-06-26: qty 3

## 2024-06-26 MED ORDER — ONDANSETRON 4 MG PO TBDP
ORAL_TABLET | ORAL | Status: AC
  Filled 2024-06-26: qty 1

## 2024-06-26 MED ORDER — ACETAMINOPHEN 325 MG PO TABS
ORAL_TABLET | ORAL | Status: AC
  Filled 2024-06-26: qty 3

## 2024-06-26 MED ORDER — ONDANSETRON 4 MG PO TBDP
4.0000 mg | ORAL_TABLET | Freq: Once | ORAL | Status: AC
  Administered 2024-06-26: 4 mg via ORAL

## 2024-06-26 MED ORDER — SODIUM CHLORIDE 0.9 % IV SOLN
1.0000 g | Freq: Once | INTRAVENOUS | Status: AC
  Administered 2024-06-26: 1 g via INTRAVENOUS
  Filled 2024-06-26: qty 10

## 2024-06-26 MED ORDER — ACETAMINOPHEN 325 MG PO TABS
325.0000 mg | ORAL_TABLET | Freq: Once | ORAL | Status: AC
  Administered 2024-06-27: 325 mg via ORAL

## 2024-06-26 MED ORDER — ACETAMINOPHEN 325 MG PO TABS
650.0000 mg | ORAL_TABLET | Freq: Once | ORAL | Status: AC
  Administered 2024-06-26: 650 mg via ORAL

## 2024-06-26 MED ORDER — SODIUM CHLORIDE 0.9 % IV SOLN
500.0000 mg | Freq: Once | INTRAVENOUS | Status: AC
  Administered 2024-06-27: 500 mg via INTRAVENOUS
  Filled 2024-06-26: qty 5

## 2024-06-26 NOTE — ED Triage Notes (Signed)
 Symptoms started with coughing, stuffy nose started 2 weeks ago  The last few days, has had fever, epigastric pain  reports a fever of 103, unclear of what and when medicines were taken.  Tylenol  this morning.

## 2024-06-26 NOTE — Discharge Instructions (Addendum)
 Patient with worsening vital signs.  He requires higher level of care with resources not available at urgent care.  [facility] called for transport.

## 2024-06-26 NOTE — ED Provider Notes (Signed)
 " [facility]    CSN: [id number] Arrival date & time: 06/26/24  1745      History   Chief Complaint Chief Complaint  Patient presents with   Fever    Sinus pain, and coughing - Entered by patient   Appointment    18:00    HPI Tyrone Fernandez is a 58 y.o. male.   This 59 year old male is being seen for complaints of cough, fever, epigastric pain for the last few days.  He reports he initially started with stuffy nose, sinus pressure, cough 2 weeks ago.  He denies known sick contacts.  He denies ear pain, sore throat.  He endorses epigastric pain, shortness of breath, nausea, vomiting, diarrhea.  He is unsure what medications he has taken for his symptoms.  He took Tylenol  this morning.   Fever Associated symptoms: chest pain, chills, congestion, cough, diarrhea, nausea and vomiting   Associated symptoms: no dysuria, no ear pain and no sore throat     Past Medical History:  Diagnosis Date   Allergy    H/O hiatal hernia    History of kidney stones    Hypertension    SBO (small bowel obstruction) (HCC) 03/2016    Patient Active Problem List   Diagnosis Date Noted   Ventral hernia with bowel obstruction 08/19/2021   Hernia of anterior abdominal wall    SBO (small bowel obstruction) (HCC) 04/08/2016   History of bronchitis 09/27/2014   Cough 09/27/2014   Obesity 09/27/2014    Past Surgical History:  Procedure Laterality Date   1984  KNEE SURGERY     2007 OR 2008  KIDNEY STONE REMOVED     APPENDECTOMY  06/30/2001   DONE AT TIME OF COLON RESECTION   CHOLECYSTECTOMY  06/30/2008   AT SAME TIME OF HERNIA REPAIR   COLON SURGERY  06/30/2001   COLON RESECTION FOR DIVERTICULITIS / COLOSTOMY   COLOSTOMY     COLOSTOMY REVERSAL  06/30/2001   CYSTOSCOPY WITH URETEROSCOPY AND STENT PLACEMENT Right 05/03/2013   Procedure: CYSTOSCOPY, RIGHT RETROGRADE PYELOGRAMRIGHT URETEROSCOPY, STONE EXTRACTION  AND STENT PLACEMENT;  Surgeon: Donnice Gwenyth Brooks, MD;  Location:  [facility];  Service: Urology;  Laterality: Right;   HERNIA REPAIR  06/30/2008   POLYPECTOMY     VENTRAL HERNIA REPAIR N/A 08/19/2021   Procedure: OPEN HERNIA REPAIR VENTRAL ADULT;  Surgeon: Vernetta Berg, MD;  Location: [facility];  Service: General;  Laterality: N/A;       Home Medications    Prior to Admission medications  Medication Sig Start Date End Date Taking? Authorizing Provider  triamterene-hydrochlorothiazide (MAXZIDE) 75-50 MG tablet Take 1 tablet by mouth daily. 03/14/24  Yes [provider]  albuterol  (PROVENTIL  HFA;VENTOLIN  HFA) 108 (90 BASE) MCG/ACT inhaler Inhale 1-2 puffs into the lungs every 6 (six) hours as needed for wheezing or shortness of breath. Patient not taking: Reported on 04/08/2016 09/03/14 01/30/20  Arlinda Katz, PA-C  fluticasone  (FLONASE ) 50 MCG/ACT nasal spray Place 2 sprays into both nostrils daily. 08/26/14 01/30/20  Dansie, William, PA-C  omeprazole  (PRILOSEC) 10 MG capsule Take 1 capsule (10 mg total) by mouth daily. 11/07/17 01/30/20  Cuthriell, Dorn JONETTA, PA-C    Family History Family History  Problem Relation Age of Onset   Crohn's disease Mother    Diabetes Mother    Cancer Mother        breast cancer    Heart disease Paternal Grandfather    Stroke Maternal Aunt    Colon cancer  Neg Hx    Pancreatic cancer Neg Hx    Rectal cancer Neg Hx    Stomach cancer Neg Hx    Ulcerative colitis Neg Hx     Social History Social History[1]   Allergies   Patient has no known allergies.   Review of Systems Review of Systems  Constitutional:  Positive for activity change, appetite change, chills and fever.  HENT:  Positive for congestion, sinus pressure and sinus pain. Negative for ear pain and sore throat.   Respiratory:  Positive for cough and shortness of breath.   Cardiovascular:  Positive for chest pain.  Gastrointestinal:  Positive for abdominal pain, diarrhea, nausea and vomiting.  Genitourinary:  Negative for difficulty urinating,  dysuria, frequency and urgency.     Physical Exam Triage Vital Signs ED Triage Vitals [06/26/24 1902]  Encounter Vitals Group     BP      Girls Systolic BP Percentile      Girls Diastolic BP Percentile      Boys Systolic BP Percentile      Boys Diastolic BP Percentile      Pulse      Resp      Temp      Temp src      SpO2      Weight      Height      Head Circumference      Peak Flow      Pain Score 10     Pain Loc      Pain Education      Exclude from Growth Chart    No data found.  Updated Vital Signs BP (!) 84/65 (BP Location: Left Arm)   Pulse (!) 137   Temp (!) 100.5 F (38.1 C) (Oral)   Resp (!) 26   SpO2 92%   Visual Acuity Right Eye Distance:   Left Eye Distance:   Bilateral Distance:    Right Eye Near:   Left Eye Near:    Bilateral Near:     Physical Exam Vitals and nursing note reviewed.  Constitutional:      General: He is not in acute distress.    Appearance: He is well-developed. He is ill-appearing.     Comments: Pleasant male appearing stated age found sitting in chair.  He is tachycardic, tachypneic.  HENT:     Head: Normocephalic and atraumatic.     Mouth/Throat:     Mouth: Mucous membranes are moist.     Pharynx: No pharyngeal swelling or oropharyngeal exudate.  Eyes:     Conjunctiva/sclera: Conjunctivae normal.  Cardiovascular:     Rate and Rhythm: Regular rhythm. Tachycardia present.     Heart sounds: Normal heart sounds. No murmur heard. Pulmonary:     Effort: Pulmonary effort is normal. Tachypnea present. No respiratory distress.     Breath sounds: Wheezing and rhonchi present.  Abdominal:     General: Bowel sounds are normal.     Palpations: Abdomen is soft.     Tenderness: There is no abdominal tenderness.  Skin:    General: Skin is warm and dry.     Capillary Refill: Capillary refill takes less than 2 seconds.  Neurological:     Mental Status: He is alert.  Psychiatric:        Mood and Affect: Mood normal.         Behavior: Behavior is cooperative.      UC Treatments / Results  Labs (all labs ordered are listed,  but only abnormal results are displayed) Labs Reviewed  POC COVID19/FLU A&B COMBO - Abnormal; Notable for the following components:      Result Value   Influenza A Antigen, POC Positive (*)    All other components within normal limits    EKG   Radiology DG Chest 2 View Result Date: 06/26/2024 EXAM: 2 VIEW(S) XRAY OF THE CHEST 06/26/2024 07:43:46 PM COMPARISON: 03/25/2024 CLINICAL HISTORY: fever FINDINGS: LUNGS AND PLEURA: There is focal consolidation within the posterobasal left lower lobe, possibly infectious in the appropriate clinical setting. Follow-up chest radiograph is recommended in 3-4 weeks, following conservative therapy, to document resolution. If persistent at that time, dedicated contrast enhanced CT imaging of the chest is recommended for further evaluation. No pleural effusion. No pneumothorax. HEART AND MEDIASTINUM: No acute abnormality of the cardiac and mediastinal silhouettes. 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 IMPRESSION: 1. Focal consolidation within the posterobasal left lower lobe, possibly infectious. Follow-up chest radiograph is recommended in 3-4 weeks, following conservative therapy, to document resolution. If persistent at that time, dedicated contrast enhanced CT imaging of the chest is recommended for further evaluation. 2. 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 Electronically signed by: Dorethia Molt MD 06/26/2024 08:13 PM EST RP Workstation: [id number]    Procedures Procedures (including critical care time)  Medications Ordered in UC Medications  ondansetron  (ZOFRAN -ODT) disintegrating tablet 4 mg (4 mg Oral Given 06/26/24 1913)  acetaminophen  (TYLENOL ) tablet 650 mg (650 mg Oral Given 06/26/24 1920)  ipratropium-albuterol  (DUONEB) 0.5-2.5 (3) MG/3ML nebulizer solution 3 mL (3 mLs Nebulization  Given 06/26/24 1923)    Initial Impression / Assessment and Plan / UC Course  I have reviewed the triage vital signs and the nursing notes.  Pertinent labs & imaging results that were available during my care of the patient were reviewed by me and considered in my medical decision making (see chart for details).     Vitals and triage reviewed.  Patient is febrile, tachycardic, tachypneic, mildly hypoxic with O2 sats ranging 88 to 90%.SABRA  COVID/flu swab obtained and positive for influenza A.  Chest x-ray obtained and significant for focal consolidation in left lower lobe.  He is given ondansetron , acetaminophen , DuoNeb.  He is placed on supplemental oxygen at 2 L/min.  Oxygen saturation up to 91%, on room air.  Recheck of vitals reveal temp down to 100.5, pulse 137, blood pressure down to 84/65, respirations 26, O2 sat 92% on room air.  Due to vital signs, patient will be transferred via [facility] to emergency department for further evaluation and treatment.  He is agreeable to this plan.  [facility] called for transport at this time. Final Clinical Impressions(s) / UC Diagnoses   Final diagnoses:  Fever, unspecified     Discharge Instructions      Patient with worsening vital signs.  He requires higher level of care with resources not available at urgent care.  [facility] called for transport.     ED Prescriptions   None    PDMP not reviewed this encounter.    [1]  Social History Tobacco Use   Smoking status: Never   Smokeless tobacco: Never  Vaping Use   Vaping status: Never Used  Substance Use Topics   Alcohol use: No   Drug use: No     Lennice Jon BROCKS, FNP 06/26/24 2102  "

## 2024-06-26 NOTE — ED Notes (Signed)
 [facility] received reports from Jon Nimrod, NP.

## 2024-06-26 NOTE — ED Triage Notes (Signed)
 Pt BIB [facility] from urgent care with c/o [facility]. Per [facility], pt was hypotensive with tachy in the 120s. Pt states he has had a cough for two weeks, SHOB and fever for 4 days. Pt states all symptoms worsened this morning. [facility] gave 1000 mL NS.

## 2024-06-26 NOTE — Sepsis Progress Note (Signed)
 Elink following for sepsis protocol.

## 2024-06-26 NOTE — ED Provider Notes (Incomplete)
 " [place] EM[facility]GENCY DEPARTMENT AT [facility] Provider Note   CSN: [id number] Arrival date & time: 06/26/24  2126     Patient presents with: Tachycardia   Tyrone Fernandez is a 58 y.o. male with history of SBO, kidney stones, hypertension.  Presents to ED complaining of shortness of breath, cough, fevers.  The patient reports that he has had ongoing symptoms for the last 2 weeks to include fever, cough, generalized bodyaches and chills.  Reports 2 visits of nausea and vomiting, 1 episode of diarrhea yesterday.  Reports he went to urgent care today, was advised that he had high heart rate and low blood pressure and was sent to ED for further care.  Patient was diagnosed with influenza A, had chest radiograph which showed concerning findings of pneumonia.  Patient arrives tachycardic with a blood pressure of 105/65.  Temperature 100.1 F.  Reports he recently received Tylenol  in urgent care.  Endorsing shortness of breath, denies chest pain.  Denies any current nausea or vomiting.  Denies any abdominal pain or dysuria.  Reporting generalized body aches and chills.  Denies any lightheadedness or dizziness.  Denies leg swelling.  Reports he has not taken blood pressure medication today.  HPI     Prior to Admission medications  Medication Sig Start Date End Date Taking? Authorizing Provider  triamterene-hydrochlorothiazide (MAXZIDE) 75-50 MG tablet Take 1 tablet by mouth daily. 03/14/24   [provider]  albuterol  (PROVENTIL  HFA;VENTOLIN  HFA) 108 (90 BASE) MCG/ACT inhaler Inhale 1-2 puffs into the lungs every 6 (six) hours as needed for wheezing or shortness of breath. Patient not taking: Reported on 04/08/2016 09/03/14 01/30/20  Arlinda Katz, PA-C  fluticasone  (FLONASE ) 50 MCG/ACT nasal spray Place 2 sprays into both nostrils daily. 08/26/14 01/30/20  Dansie, William, PA-C  omeprazole  (PRILOSEC) 10 MG capsule Take 1 capsule (10 mg total) by mouth daily. 11/07/17 01/30/20  Cuthriell,  Dorn JONETTA, PA-C    Allergies: Patient has no known allergies.    Review of Systems  Updated Vital Signs BP 105/65 (BP Location: Left Arm)   Pulse (!) 120   Temp 100.1 F (37.8 C) (Oral)   Resp 18   Ht 5' 9 (1.753 m)   Wt (!) 147 kg   SpO2 99%   BMI 47.85 kg/m   Physical Exam  (all labs ordered are listed, but only abnormal results are displayed) Labs Reviewed  CULTURE, BLOOD (ROUTINE X 2)  CULTURE, BLOOD (ROUTINE X 2)  COMPREHENSIVE METABOLIC PANEL WITH GFR  CBC WITH DIFFERENTIAL/PLATELET  PROTIME-INR  URINALYSIS, W/ REFLEX TO CULTURE (INFECTION SUSPECTED)  I-STAT CG4 LACTIC ACID, ED    EKG: None  Radiology: DG Chest 2 View Result Date: 06/26/2024 EXAM: 2 VIEW(S) XRAY OF THE CHEST 06/26/2024 07:43:46 PM COMPARISON: 03/25/2024 CLINICAL HISTORY: fever FINDINGS: LUNGS AND PLEURA: There is focal consolidation within the posterobasal left lower lobe, possibly infectious in the appropriate clinical setting. Follow-up chest radiograph is recommended in 3-4 weeks, following conservative therapy, to document resolution. If persistent at that time, dedicated contrast enhanced CT imaging of the chest is recommended for further evaluation. No pleural effusion. No pneumothorax. HEART AND MEDIASTINUM: No acute abnormality of the cardiac and mediastinal silhouettes. 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 IMPRESSION: 1. Focal consolidation within the posterobasal left lower lobe, possibly infectious. Follow-up chest radiograph is recommended in 3-4 weeks, following conservative therapy, to document resolution. If persistent at that time, dedicated contrast enhanced CT imaging of the chest is  recommended for further evaluation. 2. 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 Electronically signed by: Dorethia Molt MD 06/26/2024 08:13 PM EST RP Workstation: [id number]    {Document cardiac monitor, telemetry assessment procedure when  appropriate:32947} Procedures   Medications Ordered in the ED  cefTRIAXone  (ROCEPHIN ) 1 g in sodium chloride  0.9 % 100 mL IVPB (has no administration in time range)  azithromycin  (ZITHROMAX ) 500 mg in sodium chloride  0.9 % 250 mL IVPB (has no administration in time range)      {Click here for ABCD2, HEART and other calculators REFRESH Note before signing:1}                              Medical Decision Making Amount and/or Complexity of Data Reviewed Labs: ordered. Radiology: ordered.   ***  {Document critical care time when appropriate  Document review of labs and clinical decision tools ie CHADS2VASC2, etc  Document your independent review of radiology images and any outside records  Document your discussion with family members, caretakers and with consultants  Document social determinants of health affecting pt's care  Document your decision making why or why not admission, treatments were needed:32947:::1}   Final diagnoses:  None    ED Discharge Orders     None        "

## 2024-06-26 NOTE — ED Provider Notes (Incomplete)
 " [place] EM[facility]ENC[facility] DEPARTMENT AT [facility] Provider Note   CSN: [id number] Arrival date & time: 06/26/24  2126     Patient presents with: Tachycardia   Tyrone Fernandez is a 58 y.o. male with history of SBO, kidney stones, hypertension.  Presents to ED complaining of  HPI     Prior to Admission medications  Medication Sig Start Date End Date Taking? Authorizing Provider  triamterene-hydrochlorothiazide (MAXZIDE) 75-50 MG tablet Take 1 tablet by mouth daily. 03/14/24   [provider]  albuterol  (PROVENTIL  HFA;VENTOLIN  HFA) 108 (90 BASE) MCG/ACT inhaler Inhale 1-2 puffs into the lungs every 6 (six) hours as needed for wheezing or shortness of breath. Patient not taking: Reported on 04/08/2016 09/03/14 01/30/20  Arlinda Katz, PA-C  fluticasone  (FLONASE ) 50 MCG/ACT nasal spray Place 2 sprays into both nostrils daily. 08/26/14 01/30/20  Dansie, William, PA-C  omeprazole  (PRILOSEC) 10 MG capsule Take 1 capsule (10 mg total) by mouth daily. 11/07/17 01/30/20  Cuthriell, Dorn JONETTA, PA-C    Allergies: Patient has no known allergies.    Review of Systems  Updated Vital Signs BP 105/65 (BP Location: Left Arm)   Pulse (!) 120   Temp 100.1 F (37.8 C) (Oral)   Resp 18   Ht 5' 9 (1.753 m)   Wt (!) 147 kg   SpO2 99%   BMI 47.85 kg/m   Physical Exam  (all labs ordered are listed, but only abnormal results are displayed) Labs Reviewed  CULTURE, BLOOD (ROUTINE X 2)  CULTURE, BLOOD (ROUTINE X 2)  COMPREHENSIVE METABOLIC PANEL WITH GFR  CBC WITH DIFFERENTIAL/PLATELET  PROTIME-INR  URINALYSIS, W/ REFLEX TO CULTURE (INFECTION SUSPECTED)  I-STAT CG4 LACTIC ACID, ED    EKG: None  Radiology: DG Chest 2 View Result Date: 06/26/2024 EXAM: 2 VIEW(S) XRAY OF THE CHEST 06/26/2024 07:43:46 PM COMPARISON: 03/25/2024 CLINICAL HISTORY: fever FINDINGS: LUNGS AND PLEURA: There is focal consolidation within the posterobasal left lower lobe, possibly infectious in the  appropriate clinical setting. Follow-up chest radiograph is recommended in 3-4 weeks, following conservative therapy, to document resolution. If persistent at that time, dedicated contrast enhanced CT imaging of the chest is recommended for further evaluation. No pleural effusion. No pneumothorax. HEART AND MEDIASTINUM: No acute abnormality of the cardiac and mediastinal silhouettes. 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 IMPRESSION: 1. Focal consolidation within the posterobasal left lower lobe, possibly infectious. Follow-up chest radiograph is recommended in 3-4 weeks, following conservative therapy, to document resolution. If persistent at that time, dedicated contrast enhanced CT imaging of the chest is recommended for further evaluation. 2. 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 Electronically signed by: Dorethia Molt MD 06/26/2024 08:13 PM EST RP Workstation: [id number]    {Document cardiac monitor, telemetry assessment procedure when appropriate:32947} Procedures   Medications Ordered in the ED  cefTRIAXone  (ROCEPHIN ) 1 g in sodium chloride  0.9 % 100 mL IVPB (has no administration in time range)  azithromycin  (ZITHROMAX ) 500 mg in sodium chloride  0.9 % 250 mL IVPB (has no administration in time range)      {Click here for ABCD2, HEART and other calculators REFRESH Note before signing:1}                              Medical Decision Making Amount and/or Complexity of Data Reviewed Labs: ordered. Radiology: ordered.   ***  {Document critical care time when appropriate  Document review of labs and  clinical decision tools ie CHADS2VASC2, etc  Document your independent review of radiology images and any outside records  Document your discussion with family members, caretakers and with consultants  Document social determinants of health affecting pt's care  Document your decision making why or why not admission, treatments were  needed:32947:::1}   Final diagnoses:  None    ED Discharge Orders     None        "

## 2024-06-26 NOTE — ED Notes (Signed)
 Patient is being discharged from the Urgent Care and sent to the Emergency Department via [facility] . Per Jon Nimrod, NP, patient is in need of higher level of care due to hypotension, tachycardia, febrile, hypoxia, pneumonia/flu. Patient is aware and verbalizes understanding of plan of care.  Vitals:   06/26/24 2051 06/26/24 2108  BP: (!) 84/65 (!) 78/57  Pulse: (!) 137 (!) 140  Resp: (!) 26 (!) 30  Temp: (!) 100.5 F (38.1 C) (!) 100.5 F (38.1 C)  SpO2: 92% 92%

## 2024-06-26 NOTE — ED Notes (Signed)
 Patient is being discharged from the Urgent Care and sent to the Emergency Department via [facility] . Per Provider, patient is in need of higher level of care due to Positive Flu A, Hypotensive, Tachycardia, and Pneumonia. Patient is aware and verbalizes understanding of plan of care.  Vitals:   06/26/24 1935 06/26/24 2051  BP:  (!) 84/65  Pulse: (!) 138 (!) 137  Resp:  (!) 26  Temp:  (!) 100.5 F (38.1 C)  SpO2: 92% 92%

## 2024-06-27 ENCOUNTER — Encounter (HOSPITAL_COMMUNITY): Payer: Self-pay | Admitting: Internal Medicine

## 2024-06-27 DIAGNOSIS — A084 Viral intestinal infection, unspecified: Secondary | ICD-10-CM | POA: Diagnosis present

## 2024-06-27 DIAGNOSIS — A419 Sepsis, unspecified organism: Secondary | ICD-10-CM | POA: Diagnosis present

## 2024-06-27 DIAGNOSIS — J189 Pneumonia, unspecified organism: Secondary | ICD-10-CM | POA: Diagnosis not present

## 2024-06-27 DIAGNOSIS — N200 Calculus of kidney: Secondary | ICD-10-CM | POA: Diagnosis not present

## 2024-06-27 DIAGNOSIS — R000 Tachycardia, unspecified: Secondary | ICD-10-CM | POA: Diagnosis present

## 2024-06-27 DIAGNOSIS — J9601 Acute respiratory failure with hypoxia: Secondary | ICD-10-CM | POA: Diagnosis present

## 2024-06-27 DIAGNOSIS — Z803 Family history of malignant neoplasm of breast: Secondary | ICD-10-CM | POA: Diagnosis not present

## 2024-06-27 DIAGNOSIS — E876 Hypokalemia: Secondary | ICD-10-CM | POA: Insufficient documentation

## 2024-06-27 DIAGNOSIS — I10 Essential (primary) hypertension: Secondary | ICD-10-CM | POA: Diagnosis present

## 2024-06-27 DIAGNOSIS — R54 Age-related physical debility: Secondary | ICD-10-CM | POA: Diagnosis present

## 2024-06-27 DIAGNOSIS — J09X1 Influenza due to identified novel influenza A virus with pneumonia: Secondary | ICD-10-CM | POA: Insufficient documentation

## 2024-06-27 DIAGNOSIS — N179 Acute kidney failure, unspecified: Secondary | ICD-10-CM | POA: Diagnosis present

## 2024-06-27 DIAGNOSIS — K760 Fatty (change of) liver, not elsewhere classified: Secondary | ICD-10-CM | POA: Diagnosis present

## 2024-06-27 DIAGNOSIS — Z8249 Family history of ischemic heart disease and other diseases of the circulatory system: Secondary | ICD-10-CM | POA: Diagnosis not present

## 2024-06-27 DIAGNOSIS — R6520 Severe sepsis without septic shock: Secondary | ICD-10-CM | POA: Diagnosis present

## 2024-06-27 DIAGNOSIS — R197 Diarrhea, unspecified: Secondary | ICD-10-CM

## 2024-06-27 DIAGNOSIS — E871 Hypo-osmolality and hyponatremia: Secondary | ICD-10-CM | POA: Diagnosis present

## 2024-06-27 DIAGNOSIS — N132 Hydronephrosis with renal and ureteral calculous obstruction: Secondary | ICD-10-CM | POA: Diagnosis present

## 2024-06-27 DIAGNOSIS — Z833 Family history of diabetes mellitus: Secondary | ICD-10-CM | POA: Diagnosis not present

## 2024-06-27 DIAGNOSIS — Z823 Family history of stroke: Secondary | ICD-10-CM | POA: Diagnosis not present

## 2024-06-27 DIAGNOSIS — N281 Cyst of kidney, acquired: Secondary | ICD-10-CM | POA: Diagnosis present

## 2024-06-27 DIAGNOSIS — J159 Unspecified bacterial pneumonia: Secondary | ICD-10-CM | POA: Diagnosis present

## 2024-06-27 DIAGNOSIS — J1008 Influenza due to other identified influenza virus with other specified pneumonia: Secondary | ICD-10-CM | POA: Diagnosis present

## 2024-06-27 DIAGNOSIS — E559 Vitamin D deficiency, unspecified: Secondary | ICD-10-CM | POA: Diagnosis present

## 2024-06-27 DIAGNOSIS — Z9049 Acquired absence of other specified parts of digestive tract: Secondary | ICD-10-CM | POA: Diagnosis not present

## 2024-06-27 DIAGNOSIS — E872 Acidosis, unspecified: Secondary | ICD-10-CM | POA: Diagnosis present

## 2024-06-27 LAB — CBC WITH DIFFERENTIAL/PLATELET
Abs Immature Granulocytes: 0.28 K/uL — ABNORMAL HIGH (ref 0.00–0.07)
Basophils Absolute: 0 K/uL (ref 0.0–0.1)
Basophils Relative: 0 %
Eosinophils Absolute: 0 K/uL (ref 0.0–0.5)
Eosinophils Relative: 0 %
HCT: 38.5 % — ABNORMAL LOW (ref 39.0–52.0)
Hemoglobin: 12.4 g/dL — ABNORMAL LOW (ref 13.0–17.0)
Immature Granulocytes: 2 %
Lymphocytes Relative: 6 %
Lymphs Abs: 1.1 K/uL (ref 0.7–4.0)
MCH: 27.5 pg (ref 26.0–34.0)
MCHC: 32.2 g/dL (ref 30.0–36.0)
MCV: 85.4 fL (ref 80.0–100.0)
Monocytes Absolute: 0.3 K/uL (ref 0.1–1.0)
Monocytes Relative: 2 %
Neutro Abs: 16.8 K/uL — ABNORMAL HIGH (ref 1.7–7.7)
Neutrophils Relative %: 90 %
Platelets: 201 K/uL (ref 150–400)
RBC: 4.51 MIL/uL (ref 4.22–5.81)
RDW: 15.2 % (ref 11.5–15.5)
WBC: 18.5 K/uL — ABNORMAL HIGH (ref 4.0–10.5)
nRBC: 0 % (ref 0.0–0.2)

## 2024-06-27 LAB — URINALYSIS, W/ REFLEX TO CULTURE (INFECTION SUSPECTED)
Bacteria, UA: NONE SEEN
Bilirubin Urine: NEGATIVE
Glucose, UA: NEGATIVE mg/dL
Ketones, ur: NEGATIVE mg/dL
Leukocytes,Ua: NEGATIVE
Nitrite: NEGATIVE
Protein, ur: 30 mg/dL — AB
Specific Gravity, Urine: 1.033 — ABNORMAL HIGH (ref 1.005–1.030)
pH: 5 (ref 5.0–8.0)

## 2024-06-27 LAB — C DIFFICILE QUICK SCREEN W PCR REFLEX
C Diff antigen: NEGATIVE
C Diff interpretation: NOT DETECTED
C Diff toxin: NEGATIVE

## 2024-06-27 LAB — TROPONIN T, HIGH SENSITIVITY
Troponin T High Sensitivity: 21 ng/L — ABNORMAL HIGH (ref 0–19)
Troponin T High Sensitivity: 19 ng/L (ref 0–19)

## 2024-06-27 LAB — BASIC METABOLIC PANEL WITH GFR
Anion gap: 12 (ref 5–15)
BUN: 14 mg/dL (ref 6–20)
CO2: 23 mmol/L (ref 22–32)
Calcium: 7.6 mg/dL — ABNORMAL LOW (ref 8.9–10.3)
Chloride: 101 mmol/L (ref 98–111)
Creatinine, Ser: 1.05 mg/dL (ref 0.61–1.24)
GFR, Estimated: >60 mL/min
Glucose, Bld: 127 mg/dL — ABNORMAL HIGH (ref 70–99)
Potassium: 3.4 mmol/L — ABNORMAL LOW (ref 3.5–5.1)
Sodium: 136 mmol/L (ref 135–145)

## 2024-06-27 LAB — HIV ANTIBODY (ROUTINE TESTING W REFLEX): HIV Screen 4th Generation wRfx: NONREACTIVE

## 2024-06-27 LAB — I-STAT CG4 LACTIC ACID, ED: Lactic Acid, Venous: 2.5 mmol/L (ref 0.5–1.9)

## 2024-06-27 LAB — TSH: TSH: 0.93 u[IU]/mL (ref 0.350–4.500)

## 2024-06-27 LAB — VITAMIN D 25 HYDROXY (VIT D DEFICIENCY, FRACTURES): Vit D, 25-Hydroxy: 10.9 ng/mL — ABNORMAL LOW (ref 30–100)

## 2024-06-27 LAB — LACTIC ACID, PLASMA
Lactic Acid, Venous: 2.5 mmol/L (ref 0.5–1.9)
Lactic Acid, Venous: 1.8 mmol/L (ref 0.5–1.9)

## 2024-06-27 LAB — MAGNESIUM: Magnesium: 1.4 mg/dL — ABNORMAL LOW (ref 1.7–2.4)

## 2024-06-27 MED ORDER — AZITHROMYCIN 500 MG IV SOLR
500.0000 mg | INTRAVENOUS | Status: AC
  Administered 2024-06-27 – 2024-06-28 (×2): 500 mg via INTRAVENOUS
  Filled 2024-06-27 (×2): qty 5

## 2024-06-27 MED ORDER — ONDANSETRON 4 MG PO TBDP: 4.0000 mg | ORAL_TABLET | Freq: Three times a day (TID) | ORAL | Status: DC | PRN

## 2024-06-27 MED ORDER — ONDANSETRON HCL 4 MG/2ML IJ SOLN
4.0000 mg | Freq: Three times a day (TID) | INTRAMUSCULAR | Status: DC | PRN
  Filled 2024-06-27: qty 2

## 2024-06-27 MED ORDER — POTASSIUM CHLORIDE 20 MEQ PO PACK
20.0000 meq | PACK | Freq: Once | ORAL | Status: AC
  Administered 2024-06-27: 20 meq via ORAL
  Filled 2024-06-27: qty 1

## 2024-06-27 MED ORDER — MELATONIN 3 MG PO TABS
3.0000 mg | ORAL_TABLET | Freq: Every evening | ORAL | Status: DC | PRN
  Administered 2024-06-27 – 2024-07-01 (×2): 3 mg via ORAL
  Filled 2024-06-27 (×2): qty 1

## 2024-06-27 MED ORDER — PROCHLORPERAZINE EDISYLATE 10 MG/2ML IJ SOLN
5.0000 mg | Freq: Once | INTRAMUSCULAR | Status: AC
  Administered 2024-06-27: 5 mg via INTRAVENOUS
  Filled 2024-06-27: qty 2

## 2024-06-27 MED ORDER — LACTATED RINGERS IV SOLN: INTRAVENOUS | Status: AC

## 2024-06-27 MED ORDER — ACETAMINOPHEN 325 MG PO TABS
650.0000 mg | ORAL_TABLET | Freq: Four times a day (QID) | ORAL | Status: DC | PRN
  Administered 2024-06-28: 650 mg via ORAL
  Filled 2024-06-27: qty 2

## 2024-06-27 MED ORDER — OSELTAMIVIR PHOSPHATE 75 MG PO CAPS
75.0000 mg | ORAL_CAPSULE | Freq: Two times a day (BID) | ORAL | Status: DC
  Administered 2024-06-27 – 2024-07-01 (×9): 75 mg via ORAL
  Filled 2024-06-27 (×11): qty 1

## 2024-06-27 MED ORDER — ENOXAPARIN SODIUM 80 MG/0.8ML IJ SOSY
70.0000 mg | PREFILLED_SYRINGE | INTRAMUSCULAR | Status: DC
  Administered 2024-06-27 – 2024-07-01 (×5): 70 mg via SUBCUTANEOUS
  Filled 2024-06-27 (×4): qty 0.8
  Filled 2024-06-27: qty 0.7

## 2024-06-27 MED ORDER — ONDANSETRON HCL 4 MG/2ML IJ SOLN
4.0000 mg | Freq: Once | INTRAMUSCULAR | Status: AC
  Administered 2024-06-27: 4 mg via INTRAVENOUS
  Filled 2024-06-27: qty 2

## 2024-06-27 MED ORDER — SODIUM CHLORIDE 0.9 % IV BOLUS
1000.0000 mL | Freq: Once | INTRAVENOUS | Status: AC
  Administered 2024-06-27: 1000 mL via INTRAVENOUS

## 2024-06-27 MED ORDER — IPRATROPIUM-ALBUTEROL 0.5-2.5 (3) MG/3ML IN SOLN: 3.0000 mL | RESPIRATORY_TRACT | Status: DC | PRN

## 2024-06-27 MED ORDER — SODIUM CHLORIDE 0.9 % IV SOLN
2.0000 g | INTRAVENOUS | Status: AC
  Administered 2024-06-27 – 2024-06-30 (×4): 2 g via INTRAVENOUS
  Filled 2024-06-27 (×4): qty 20

## 2024-06-27 MED ORDER — LACTATED RINGERS IV BOLUS
500.0000 mL | Freq: Once | INTRAVENOUS | Status: AC
  Administered 2024-06-27: 500 mL via INTRAVENOUS

## 2024-06-27 MED ORDER — ACETAMINOPHEN 650 MG RE SUPP: 650.0000 mg | Freq: Four times a day (QID) | RECTAL | Status: DC | PRN

## 2024-06-27 MED ORDER — SODIUM CHLORIDE 0.9 % IV SOLN
3.0000 g | Freq: Once | INTRAVENOUS | Status: AC
  Administered 2024-06-27: 3 g via INTRAVENOUS
  Filled 2024-06-27: qty 8

## 2024-06-27 MED ORDER — CALCIUM CARBONATE ANTACID 500 MG PO CHEW
1.0000 | CHEWABLE_TABLET | Freq: Once | ORAL | Status: AC
  Administered 2024-06-27: 200 mg via ORAL
  Filled 2024-06-27: qty 1

## 2024-06-27 MED ORDER — PROCHLORPERAZINE EDISYLATE 10 MG/2ML IJ SOLN
5.0000 mg | Freq: Four times a day (QID) | INTRAMUSCULAR | Status: DC | PRN
  Administered 2024-06-27: 5 mg via INTRAVENOUS
  Filled 2024-06-27: qty 2

## 2024-06-27 MED ORDER — IPRATROPIUM-ALBUTEROL 0.5-2.5 (3) MG/3ML IN SOLN
RESPIRATORY_TRACT | Status: AC
  Administered 2024-06-27: 3 mL via RESPIRATORY_TRACT
  Filled 2024-06-27: qty 3

## 2024-06-27 MED ORDER — LOPERAMIDE HCL 2 MG PO CAPS
2.0000 mg | ORAL_CAPSULE | ORAL | Status: DC | PRN
  Administered 2024-06-27: 2 mg via ORAL
  Filled 2024-06-27: qty 1

## 2024-06-27 MED ORDER — SODIUM CHLORIDE 0.9 % IV SOLN: 500.0000 mg | INTRAVENOUS | Status: DC

## 2024-06-27 MED ORDER — PROCHLORPERAZINE MALEATE 5 MG PO TABS: 5.0000 mg | ORAL_TABLET | Freq: Four times a day (QID) | ORAL | Status: DC | PRN

## 2024-06-27 MED ORDER — VITAMIN D (ERGOCALCIFEROL) 1.25 MG (50000 UNIT) PO CAPS
50000.0000 [IU] | ORAL_CAPSULE | ORAL | Status: DC
  Administered 2024-06-28: 50000 [IU] via ORAL
  Filled 2024-06-27: qty 1

## 2024-06-27 NOTE — ED Notes (Signed)
 Request secretary to page the provider

## 2024-06-27 NOTE — Progress Notes (Signed)
 Patient received from ED, AO x4, denies any pain. CHG bath completed, connected to tele and CCMD notified. Oriented pt to room and call bell system. Call bell within reach. Plan of care continues.

## 2024-06-27 NOTE — ED Notes (Signed)
"   Secure chat sent to provider requesting medication for nausea. Awaiting response "

## 2024-06-27 NOTE — Progress Notes (Addendum)
 " PROGRESS NOTE    Tyrone Fernandez  FMW:[id number] DOB: 09/03/1965 DOA: 06/26/2024 PCP: Buck Search, PA-C  Subjective: No acute events overnight. Seen and examined at bedside. Reports feeling slightly better with some improvement in shortness of breath. Reports still fatigued and intermittently nauseous with poor oral intake. As per nursing, having diarrhea.  Hospital Course:   As per H&P: 58 y.o. male with history of hypertension was brought to the ER after patient was having fever chills feeling short of breath and productive cough for the last few days which got acutely worsened.  Patient states he has been and some cough for last 2 weeks which he attributes to the sinus infection.  But over the last few days it has not progressively worse he started feeling generally weak tired and started having chills and rigors with fever at home.  Since he got worse and became weaker was brought to the ER.   Assessment and Plan:  Severe sepsis Acute influenza A infection Superimposed bacterial pneumonia  -  at presentation patient was hypotensive with fever leukocytosis elevated lactic acid level  - CT chest showing pneumonia and also had influenza A consistent with sepsis physiology.  - cont oseltamivir  for 5 days - cont azithromycin  500mg  3 days course - cont ceftriaxone  5 day course - cont IV fluids - encourage oral intake/hydration - wean oxygen as tolerated  Acute hypoxic respiratory failure - patient presented with acute cough, shortness of breath, fevers, chills and hypoxia - likely in the setting of acute influenza and bacterial PNA - currently on 2L [place], not on oxygen at home - see pneumonia management as elsewhere - start incentive spirometry - OOB to chair during daytime - ambulate as tolerated - wean oxygen as tolerated - will need outpatient sleep study referral to rule out OSA on discharge  Diarrhea - unclear if related to viral illness or bacterial etiology - as per  patient, worsened since antibiotics started on admission - has had 4-5 episodes today - cont IV fluids to comsensate for fluid losses - encourage oral intake/hydration - will check C diff  - consider loperamide  PRN for symptomatic relief if C diff negative  Lactic acidosis - resolved with IV hydration - encourage oral intake/hydration - monitor clinically  Mild L hydronephrosis Bilateral nephrolithiasis Acute renal insufficiency - Cr 1 < 1.2, baseline 0.6-0.8 - UA unremarkable - CT A/P showed mild L hydronephrosis with urothelial thickening and peripelvic/proximal periureteral stranding, possibly due to chronic inflammation owing to stones in the renal pelvis ovus. No ureteral stones. 2 large stones at renal pelvis measuring 1.8 x 0.8cm and 1 x 1.6 cm. 1.3cm L upper pole kidney cyst and 2.1cm R inferior pole kidney cyst.  - Cr back to baseline with IV hydration - will need urology follow up outpatient for management of nephrolithiasis  History of hypertension - at presentation patient was hypotensive  - will hold patient's home triamterene-HCTZ.  Hyponatremia - resolved with IV hydration  Hypokalemia  - monitor and replete electrolytes as needed  - monitor BMP  Hypocalcemia  Vitamin D  deficiency Start vitamin D  supplement qWeekly Monitor and replete calcium  as needed   Hepatic steatosis  Seen on CT A/P Follow up outpatient  DVT prophylaxis:   Lovenox    Code Status: Full Code  Disposition Plan: TBD Reason for continuing need for hospitalization: severity of illness  Objective: Vitals:   06/27/24 1110 06/27/24 1111 06/27/24 1112 06/27/24 1140  BP: 126/72  128/75 119/71  Pulse: 97 96 (!)  102 (!) 105  Resp: (!) 31 (!) 31 (!) 28 (!) 21  Temp:    97.8 F (36.6 C)  TempSrc:    Oral  SpO2: 95% (!) 85% 99% 94%  Weight:      Height:        Intake/Output Summary (Last 24 hours) at 06/27/2024 1217 Last data filed at 06/27/2024 0555 Gross per 24 hour  Intake  1781.77 ml  Output --  Net 1781.77 ml   Filed Weights   06/26/24 2151  Weight: (!) 147 kg    Examination:  Physical Exam Vitals and nursing note reviewed.  Constitutional:      General: He is not in acute distress.    Appearance: He is ill-appearing.  HENT:     Head: Normocephalic and atraumatic.  Cardiovascular:     Rate and Rhythm: Normal rate and regular rhythm.     Pulses: Normal pulses.     Heart sounds: Normal heart sounds.  Pulmonary:     Effort: Pulmonary effort is normal. No respiratory distress.     Breath sounds: Normal breath sounds. No wheezing.  Abdominal:     General: Bowel sounds are normal. There is no distension.     Palpations: Abdomen is soft.     Tenderness: There is no abdominal tenderness.  Neurological:     Mental Status: He is alert. Mental status is at baseline.     Data Reviewed: I have personally reviewed following labs and imaging studies  CBC: Recent Labs  Lab 06/26/24 2212 06/27/24 0400  WBC 18.5* 18.5*  NEUTROABS 17.0* 16.8*  HGB 13.0 12.4*  HCT 39.8 38.5*  MCV 84.9 85.4  PLT 219 201   Basic Metabolic Panel: Recent Labs  Lab 06/26/24 2212 06/27/24 0400  NA 134* 136  K 3.2* 3.4*  CL 100 101  CO2 20* 23  GLUCOSE 119* 127*  BUN 14 14  CREATININE 1.20 1.05  CALCIUM  7.7* 7.6*  MG  --  1.4*   GFR: Estimated Creatinine Clearance: 108.4 mL/min (by C-G formula based on SCr of 1.05 mg/dL). Liver Function Tests: Recent Labs  Lab 06/26/24 2212  AST 41  ALT 28  ALKPHOS 65  BILITOT 0.6  PROT 6.7  ALBUMIN 3.1*   Recent Labs  Lab 06/26/24 2212  LIPASE 20   No results for input(s): AMMONIA in the last 168 hours. Coagulation Profile: Recent Labs  Lab 06/26/24 2212  INR 1.1   Cardiac Enzymes: No results for input(s): CKTOTAL, CKMB, CKMBINDEX, TROPONINI in the last 168 hours. ProBNP, BNP (last 5 results) No results for input(s): PROBNP, BNP in the last 8760 hours. HbA1C: No results for input(s):  HGBA1C in the last 72 hours. CBG: No results for input(s): GLUCAP in the last 168 hours. Lipid Profile: No results for input(s): CHOL, HDL, LDLCALC, TRIG, CHOLHDL, LDLDIRECT in the last 72 hours. Thyroid Function Tests: Recent Labs    06/27/24 0400  TSH 0.930   Anemia Panel: No results for input(s): VITAMINB12, FOLATE, FERRITIN, TIBC, IRON, RETICCTPCT in the last 72 hours. Sepsis Labs: Recent Labs  Lab 06/26/24 2239 06/27/24 0035 06/27/24 0400 06/27/24 9371  LATICACIDVEN 2.2* 2.5* 2.5* 1.8    Recent Results (from the past 240 hours)  Blood Culture (routine x 2)     Status: None (Preliminary result)   Collection Time: 06/26/24 10:09 PM   Specimen: BLOOD  Result Value Ref Range Status   Specimen Description BLOOD SITE NOT SPECIFIED  Final   Special Requests  Final    BOTTLES DRAWN AEROBIC ONLY Blood Culture adequate volume   Culture   Final    NO GROWTH < 12 HOURS Performed at [facility], [address] [place], [place] [address]    Report Status PENDING  Incomplete  Blood Culture (routine x 2)     Status: None (Preliminary result)   Collection Time: 06/26/24 10:14 PM   Specimen: BLOOD  Result Value Ref Range Status   Specimen Description BLOOD SITE NOT SPECIFIED  Final   Special Requests   Final    BOTTLES DRAWN AEROBIC AND ANAEROBIC Blood Culture adequate volume   Culture   Final    NO GROWTH < 12 HOURS Performed at [facility], [address] [place], [place] [address]    Report Status PENDING  Incomplete     Radiology Studies: CT CHEST ABDOMEN PELVIS W CONTRAST Result Date: 06/27/2024 EXAM: CT CHEST, ABDOMEN AND PELVIS WITH CONTRAST 06/26/2024 11:40:33 PM TECHNIQUE: CT of the chest, abdomen and pelvis was performed with the administration of 75 mL of iohexol  (OMNIPAQUE ) 350 MG/ML injection. Multiplanar reformatted images are provided for review. Automated exposure control, iterative reconstruction, and/or weight  based adjustment of the mA/kV was utilized to reduce the radiation dose to as low as reasonably achievable. COMPARISON: CT abdomen and pelvis without contrast 08/19/2021, PA and lateral chest today, PA and lateral chest 03/25/2024 and 07/17/2022. There is no prior chest CT. CLINICAL HISTORY: Concern for SBO as well as desiring a better characterization of infection in lungs. FINDINGS: CHEST: MEDIASTINUM AND LYMPH NODES: There is mild cardiomegaly. There is no pericardial effusion. View appreciable coronary calcifications. The pulmonary arteries and veins are normal in caliber. No central arterial embolism is seen. The thoracic aorta is tortuous. The thoracic aorta and great vessels are otherwise unremarkable. There is a mildly prominent subcarinal lymph node to the right measuring 10 mm on axial 34 of series 3. Similar mildly prominent right mid hilar lymph node on image 31. There is no bulky, encasing, or further adenopathy. There is a small hiatal hernia. There is a chronic right posterior diaphragmatic fat herniation. LUNGS AND PLEURA: There is dense consolidation with air bronchograms in the left lower lobe, relatively sparing the superior and anterior segments. This is probably due to bacterial pneumonia or aspiration. There is diffuse bronchial thickening. Linear atelectasis in the right lower lobe. The lungs are otherwise clear. No nodules or pleural effusion noted. No pneumothorax. A follow-up CT is needed after treatment to ensure clearing. ABDOMEN AND PELVIS: LIVER: The liver is 23 cm in length with moderate to severe increased steatosis since 2023. There is no mass. GALLBLADDER AND BILE DUCTS: Gallbladder is absent. No biliary ductal dilatation. SPLEEN: There is mild splenomegaly with an AP axis of 15.2 cm, similar to the previous exam. PANCREAS: There is fatty infiltration in the proximal pancreas without mass or ductal dilatation. ADRENAL GLANDS: There is no adrenal mass. KIDNEYS, URETERS AND BLADDER:  Right kidney: There is a stable 2.1 cm Bosniak 2 cyst in the inferior pole of the right kidney with a single thin septation and dependent calcifications. There are small scattered sub-4 mm stones in the right renal collecting system. Left kidney: There is a stable 1.3 cm Bosniak 1 left upper pole cyst measuring 3.1 Hounsfield units. There are 2 larger stones in the left renal pelvis measuring 1.8 x 0.8 cm and 1 x 1.6 cm, and a few small calculi in the left mid and lower pole.  Similar to the prior study, there is mild left hydronephrosis with urothelial thickening in the left renal pelvis and peripelvic and proximal periureteral stranding probably due to chronic inflammation. Active infectious process is not excluded. Per consensus, no follow-up is needed for simple Bosniak type 1 and 2 renal cysts, unless the patient has a malignancy history or risk factors. There are no ureteral stones. No perinephric stranding. Urinary bladder is unremarkable. GI AND BOWEL: Stomach demonstrates no acute abnormality. There is no bowel obstruction or inflammation. An appendix is not seen in this patient. REPRODUCTIVE ORGANS: There is prostatomegaly with transverse axis enlargement 4.7 cm. PERITONEUM AND RETROPERITONEUM: No ascites. No free air. There was previously a large obstructing complex ventral hernia which has been repaired with intact repair. Laxity and protrusion of the abdominal wall continue to be noted. VASCULATURE: Aorta is normal in caliber. ABDOMINAL AND PELVIS LYMPH NODES: No lymphadenopathy. BONES AND SOFT TISSUES: Thoracic spine: There is moderate focal kypholevoscoliosis in the lower thoracic spine with chronic moderate anterior wedge compression fractures of T11, T12, and L1, with anterior bridging osteophytes and facet ankylosis. No acute or other significant osseous findings in the thorax. Lumbar spine: Degenerative changes and mild dextroscoliosis. Hips: Mild symmetric hip arthrosis. Soft tissues: Laxity and  protrusion of the abdominal wall continue to be noted. IMPRESSION: 1. Dense consolidation with air bronchograms in the left lower lobe, likely due to bacterial pneumonia or aspiration. Follow-up CT is needed after treatment to ensure clearing. 2. No bowel obstruction or acute bowel inflammation. 3. Mild left hydronephrosis with urothelial thickening and peripelvic/proximal periureteral stranding, possibly due to chronic inflammation owing to stones in the renal pelvis ovus . Active infectious process is not excluded. No ureteral stones. 4. Bilateral nephrolithiasis, including two sizable stones in the left renal pelvis. 5. Enlarged liver with moderate to severe increased steatosis. Clinical correlation. 6. Prostatomegaly. Electronically signed by: Francis Quam MD 06/27/2024 12:27 AM EST RP Workstation: [id number]   DG Chest 2 View Result Date: 06/26/2024 EXAM: 2 VIEW(S) XRAY OF THE CHEST 06/26/2024 07:43:46 PM COMPARISON: 03/25/2024 CLINICAL HISTORY: fever FINDINGS: LUNGS AND PLEURA: There is focal consolidation within the posterobasal left lower lobe, possibly infectious in the appropriate clinical setting. Follow-up chest radiograph is recommended in 3-4 weeks, following conservative therapy, to document resolution. If persistent at that time, dedicated contrast enhanced CT imaging of the chest is recommended for further evaluation. No pleural effusion. No pneumothorax. HEART AND MEDIASTINUM: No acute abnormality of the cardiac and mediastinal silhouettes. 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 IMPRESSION: 1. Focal consolidation within the posterobasal left lower lobe, possibly infectious. Follow-up chest radiograph is recommended in 3-4 weeks, following conservative therapy, to document resolution. If persistent at that time, dedicated contrast enhanced CT imaging of the chest is recommended for further evaluation. 2. 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 Electronically signed by: Dorethia Molt MD 06/26/2024 08:13 PM EST RP Workstation: [id number]    Scheduled Meds:  enoxaparin  (LOVENOX ) injection  70 mg Subcutaneous Q24H   oseltamivir   75 mg Oral BID   Continuous Infusions:  azithromycin      cefTRIAXone  (ROCEPHIN )  IV     lactated ringers  125 mL/hr at 06/27/24 0406     LOS: 0 days   Norval Bar, MD  [facility]  06/27/2024, 12:17 PM   "

## 2024-06-27 NOTE — ED Notes (Signed)
 Secure chat sent to provider requesting Neb treatments for pt as he is wheezing

## 2024-06-27 NOTE — ED Notes (Signed)
 Pt walked to bathroom without assistance, with oxygen. Pt steady but slow on feet.

## 2024-06-27 NOTE — H&P (Signed)
 " History and Physical    Tyrone Fernandez FMW:[id number] DOB: 12-13-1965 DOA: 06/26/2024  Patient coming from: Home.  Chief Complaint: Fever and feeling weak.  HPI: Tyrone Fernandez is a 58 y.o. male with history of hypertension was brought to the ER after patient was having fever chills feeling short of breath and productive cough for the last few days which got acutely worsened.  Patient states he has been and some cough for last 2 weeks which he attributes to the sinus infection.  But over the last few days it has not progressively worse he started feeling generally weak tired and started having chills and rigors with fever at home.  Since he got worse and became weaker was brought to the ER.  ED Course: In the ER patient was hypotensive with systolic blood pressure initially in the 78.  Patient had a temperature of 102.9 F.  Lactic acid was 2.2 worsened to 2.5.  Was given fluid bolus per sepsis protocol cultures obtained started on empiric antibiotics for pneumonia.  Patient was on 3 L oxygen.  CT chest shows features concerning for pneumonia.  Influenza A came back positive.  Patient admitted for sepsis secondary pneumonia with respiratory failure.  Review of Systems: As per HPI, rest all negative.   Past Medical History:  Diagnosis Date   Allergy    H/O hiatal hernia    History of kidney stones    Hypertension    SBO (small bowel obstruction) (HCC) 03/2016    Past Surgical History:  Procedure Laterality Date   1984  KNEE SURGERY     2007 OR 2008  KIDNEY STONE REMOVED     APPENDECTOMY  06/30/2001   DONE AT TIME OF COLON RESECTION   CHOLECYSTECTOMY  06/30/2008   AT SAME TIME OF HERNIA REPAIR   COLON SURGERY  06/30/2001   COLON RESECTION FOR DIVERTICULITIS / COLOSTOMY   COLOSTOMY     COLOSTOMY REVERSAL  06/30/2001   CYSTOSCOPY WITH URETEROSCOPY AND STENT PLACEMENT Right 05/03/2013   Procedure: CYSTOSCOPY, RIGHT RETROGRADE PYELOGRAMRIGHT URETEROSCOPY, STONE EXTRACTION  AND STENT  PLACEMENT;  Surgeon: Donnice Gwenyth Brooks, MD;  Location: [facility];  Service: Urology;  Laterality: Right;   HERNIA REPAIR  06/30/2008   POLYPECTOMY     VENTRAL HERNIA REPAIR N/A 08/19/2021   Procedure: OPEN HERNIA REPAIR VENTRAL ADULT;  Surgeon: Vernetta Berg, MD;  Location: [facility];  Service: General;  Laterality: N/A;     reports that he has never smoked. He has never used smokeless tobacco. He reports that he does not drink alcohol and does not use drugs.  Allergies[1]  Family History  Problem Relation Age of Onset   Crohn's disease Mother    Diabetes Mother    Cancer Mother        breast cancer    Heart disease Paternal Grandfather    Stroke Maternal Aunt    Colon cancer Neg Hx    Pancreatic cancer Neg Hx    Rectal cancer Neg Hx    Stomach cancer Neg Hx    Ulcerative colitis Neg Hx     Prior to Admission medications  Medication Sig Start Date End Date Taking? Authorizing Provider  acetaminophen  (TYLENOL ) 500 MG tablet Take 1,000 mg by mouth every 6 (six) hours as needed for mild pain (pain score 1-3), headache or fever.   Yes [provider]  fluticasone  (FLONASE ) 50 MCG/ACT nasal spray Place 1 spray into both nostrils daily. 03/14/24  Yes [provider]  triamterene-hydrochlorothiazide (MAXZIDE) 75-50 MG tablet Take 0.5 tablets by mouth daily. 03/14/24  Yes [provider]  albuterol  (PROVENTIL  HFA;VENTOLIN  HFA) 108 (90 BASE) MCG/ACT inhaler Inhale 1-2 puffs into the lungs every 6 (six) hours as needed for wheezing or shortness of breath. Patient not taking: Reported on 04/08/2016 09/03/14 01/30/20  Arlinda Katz, PA-C  omeprazole  (PRILOSEC) 10 MG capsule Take 1 capsule (10 mg total) by mouth daily. 11/07/17 01/30/20  Cuthriell, Dorn BIRCH, PA-C    Physical Exam: Constitutional: Moderately built and nourished. Vitals:   06/27/24 0000 06/27/24 0020 06/27/24 0030 06/27/24 0038  BP: 112/60  (!) 104/52   Pulse: (!) 114  (!) 117 (!) 122  Resp: (!)  24  16 (!) 22  Temp:  98.8 F (37.1 C)    TempSrc:  Oral    SpO2:   91% 97%  Weight:      Height:       Eyes: Anicteric no pallor. ENMT: No discharge from the ears eyes nose or mouth. Neck: No mass felt.  No neck rigidity. Respiratory: No rhonchi or crepitations. Cardiovascular: S1-S2 heard. Abdomen: Soft nontender bowel sound present. Musculoskeletal: No edema. Skin: No rash. Neurologic: Alert awake oriented to time place and person.  Moves all extremities. Psychiatric: Appears normal.  Normal affect.   Labs on Admission: I have personally reviewed following labs and imaging studies  CBC: Recent Labs  Lab 06/26/24 2212  WBC 18.5*  NEUTROABS 17.0*  HGB 13.0  HCT 39.8  MCV 84.9  PLT 219   Basic Metabolic Panel: Recent Labs  Lab 06/26/24 2212  NA 134*  K 3.2*  CL 100  CO2 20*  GLUCOSE 119*  BUN 14  CREATININE 1.20  CALCIUM  7.7*   GFR: Estimated Creatinine Clearance: 94.9 mL/min (by C-G formula based on SCr of 1.2 mg/dL). Liver Function Tests: Recent Labs  Lab 06/26/24 2212  AST 41  ALT 28  ALKPHOS 65  BILITOT 0.6  PROT 6.7  ALBUMIN 3.1*   Recent Labs  Lab 06/26/24 2212  LIPASE 20   No results for input(s): AMMONIA in the last 168 hours. Coagulation Profile: Recent Labs  Lab 06/26/24 2212  INR 1.1   Cardiac Enzymes: No results for input(s): CKTOTAL, CKMB, CKMBINDEX, TROPONINI in the last 168 hours. BNP (last 3 results) No results for input(s): PROBNP in the last 8760 hours. HbA1C: No results for input(s): HGBA1C in the last 72 hours. CBG: No results for input(s): GLUCAP in the last 168 hours. Lipid Profile: No results for input(s): CHOL, HDL, LDLCALC, TRIG, CHOLHDL, LDLDIRECT in the last 72 hours. Thyroid Function Tests: No results for input(s): TSH, T4TOTAL, FREET4, T3FREE, THYROIDAB in the last 72 hours. Anemia Panel: No results for input(s): VITAMINB12, FOLATE, FERRITIN, TIBC, IRON,  RETICCTPCT in the last 72 hours. Urine analysis:    Component Value Date/Time   COLORURINE YELLOW 06/27/2024 0109   APPEARANCEUR HAZY (A) 06/27/2024 0109   LABSPEC 1.033 (H) 06/27/2024 0109   PHURINE 5.0 06/27/2024 0109   GLUCOSEU NEGATIVE 06/27/2024 0109   HGBUR MODERATE (A) 06/27/2024 0109   BILIRUBINUR NEGATIVE 06/27/2024 0109   KETONESUR NEGATIVE 06/27/2024 0109   PROTEINUR 30 (A) 06/27/2024 0109   UROBILINOGEN 1.0 01/30/2020 1914   NITRITE NEGATIVE 06/27/2024 0109   LEUKOCYTESUR NEGATIVE 06/27/2024 0109   Sepsis Labs: @LABRCNTIP (procalcitonin:4,lacticidven:4) )No results found for this or any previous visit (from the past 240 hours).   Radiological Exams on Admission: CT CHEST ABDOMEN PELVIS W CONTRAST Result Date: 06/27/2024 EXAM: CT CHEST,  ABDOMEN AND PELVIS WITH CONTRAST 06/26/2024 11:40:33 PM TECHNIQUE: CT of the chest, abdomen and pelvis was performed with the administration of 75 mL of iohexol  (OMNIPAQUE ) 350 MG/ML injection. Multiplanar reformatted images are provided for review. Automated exposure control, iterative reconstruction, and/or weight based adjustment of the mA/kV was utilized to reduce the radiation dose to as low as reasonably achievable. COMPARISON: CT abdomen and pelvis without contrast 08/19/2021, PA and lateral chest today, PA and lateral chest 03/25/2024 and 07/17/2022. There is no prior chest CT. CLINICAL HISTORY: Concern for SBO as well as desiring a better characterization of infection in lungs. FINDINGS: CHEST: MEDIASTINUM AND LYMPH NODES: There is mild cardiomegaly. There is no pericardial effusion. View appreciable coronary calcifications. The pulmonary arteries and veins are normal in caliber. No central arterial embolism is seen. The thoracic aorta is tortuous. The thoracic aorta and great vessels are otherwise unremarkable. There is a mildly prominent subcarinal lymph node to the right measuring 10 mm on axial 34 of series 3. Similar mildly prominent  right mid hilar lymph node on image 31. There is no bulky, encasing, or further adenopathy. There is a small hiatal hernia. There is a chronic right posterior diaphragmatic fat herniation. LUNGS AND PLEURA: There is dense consolidation with air bronchograms in the left lower lobe, relatively sparing the superior and anterior segments. This is probably due to bacterial pneumonia or aspiration. There is diffuse bronchial thickening. Linear atelectasis in the right lower lobe. The lungs are otherwise clear. No nodules or pleural effusion noted. No pneumothorax. A follow-up CT is needed after treatment to ensure clearing. ABDOMEN AND PELVIS: LIVER: The liver is 23 cm in length with moderate to severe increased steatosis since 2023. There is no mass. GALLBLADDER AND BILE DUCTS: Gallbladder is absent. No biliary ductal dilatation. SPLEEN: There is mild splenomegaly with an AP axis of 15.2 cm, similar to the previous exam. PANCREAS: There is fatty infiltration in the proximal pancreas without mass or ductal dilatation. ADRENAL GLANDS: There is no adrenal mass. KIDNEYS, URETERS AND BLADDER: Right kidney: There is a stable 2.1 cm Bosniak 2 cyst in the inferior pole of the right kidney with a single thin septation and dependent calcifications. There are small scattered sub-4 mm stones in the right renal collecting system. Left kidney: There is a stable 1.3 cm Bosniak 1 left upper pole cyst measuring 3.1 Hounsfield units. There are 2 larger stones in the left renal pelvis measuring 1.8 x 0.8 cm and 1 x 1.6 cm, and a few small calculi in the left mid and lower pole. Similar to the prior study, there is mild left hydronephrosis with urothelial thickening in the left renal pelvis and peripelvic and proximal periureteral stranding probably due to chronic inflammation. Active infectious process is not excluded. Per consensus, no follow-up is needed for simple Bosniak type 1 and 2 renal cysts, unless the patient has a malignancy  history or risk factors. There are no ureteral stones. No perinephric stranding. Urinary bladder is unremarkable. GI AND BOWEL: Stomach demonstrates no acute abnormality. There is no bowel obstruction or inflammation. An appendix is not seen in this patient. REPRODUCTIVE ORGANS: There is prostatomegaly with transverse axis enlargement 4.7 cm. PERITONEUM AND RETROPERITONEUM: No ascites. No free air. There was previously a large obstructing complex ventral hernia which has been repaired with intact repair. Laxity and protrusion of the abdominal wall continue to be noted. VASCULATURE: Aorta is normal in caliber. ABDOMINAL AND PELVIS LYMPH NODES: No lymphadenopathy. BONES AND SOFT TISSUES: Thoracic spine:  There is moderate focal kypholevoscoliosis in the lower thoracic spine with chronic moderate anterior wedge compression fractures of T11, T12, and L1, with anterior bridging osteophytes and facet ankylosis. No acute or other significant osseous findings in the thorax. Lumbar spine: Degenerative changes and mild dextroscoliosis. Hips: Mild symmetric hip arthrosis. Soft tissues: Laxity and protrusion of the abdominal wall continue to be noted. IMPRESSION: 1. Dense consolidation with air bronchograms in the left lower lobe, likely due to bacterial pneumonia or aspiration. Follow-up CT is needed after treatment to ensure clearing. 2. No bowel obstruction or acute bowel inflammation. 3. Mild left hydronephrosis with urothelial thickening and peripelvic/proximal periureteral stranding, possibly due to chronic inflammation owing to stones in the renal pelvis ovus . Active infectious process is not excluded. No ureteral stones. 4. Bilateral nephrolithiasis, including two sizable stones in the left renal pelvis. 5. Enlarged liver with moderate to severe increased steatosis. Clinical correlation. 6. Prostatomegaly. Electronically signed by: Francis Quam MD 06/27/2024 12:27 AM EST RP Workstation: [id number]   DG Chest 2  View Result Date: 06/26/2024 EXAM: 2 VIEW(S) XRAY OF THE CHEST 06/26/2024 07:43:46 PM COMPARISON: 03/25/2024 CLINICAL HISTORY: fever FINDINGS: LUNGS AND PLEURA: There is focal consolidation within the posterobasal left lower lobe, possibly infectious in the appropriate clinical setting. Follow-up chest radiograph is recommended in 3-4 weeks, following conservative therapy, to document resolution. If persistent at that time, dedicated contrast enhanced CT imaging of the chest is recommended for further evaluation. No pleural effusion. No pneumothorax. HEART AND MEDIASTINUM: No acute abnormality of the cardiac and mediastinal silhouettes. 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 IMPRESSION: 1. Focal consolidation within the posterobasal left lower lobe, possibly infectious. Follow-up chest radiograph is recommended in 3-4 weeks, following conservative therapy, to document resolution. If persistent at that time, dedicated contrast enhanced CT imaging of the chest is recommended for further evaluation. 2. 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 Electronically signed by: Dorethia Molt MD 06/26/2024 08:13 PM EST RP Workstation: [id number]    EKG: Independently reviewed.  Sinus tachycardia.  Assessment/Plan Principal Problem:   Sepsis (HCC) Active Problems:   Influenza A with pneumonia   CAP (community acquired pneumonia)   Essential hypertension   Hypokalemia   Hyponatremia    Sepsis secondary to pneumonia with influenza A infection -    at presentation patient was hypotensive with fever leukocytosis elevated lactic acid level CT chest showing pneumonia and also had influenza A consistent with sepsis physiology.  Will continue with empiric antibiotics for community-acquired pneumonia and also add Tamiflu .  Follow cultures continue hydration.  Follow lactic acid levels.  Patient also has respiratory failure presently on 3 L oxygen secondary  pneumonia. History of hypertension at presentation patient was hypotensive will hold patient's diuretic. Hyponatremia and hypokalemia could be from diuretic use and dehydration.  Replace and continue hydration follow metabolic panel. Hypocalcemia check vitamin D  levels.  Corrected calcium  was 8.4.  Replace calcium . Hepatic steatosis and left-sided hydronephrosis with no definite obstruction seen will need follow-up as outpatient.  Patient's primary care physician's note states that patient has chronic sinus tachycardia.   Since patient has sepsis will need close monitoring further workup and more than 2 midnight stay.  DVT prophylaxis: Lovenox . Code Status: Full code. Family Communication: Discussed with patient. Disposition Plan: Progressive care. Consults called: None. Admission status: Inpatient.         [1] No Known Allergies  "

## 2024-06-27 NOTE — ED Notes (Signed)
 Pt complaint of chest pressure and dyspnea. Admitting provider made aware.

## 2024-06-28 LAB — CBC WITH DIFFERENTIAL/PLATELET
Abs Immature Granulocytes: 0.08 K/uL — ABNORMAL HIGH (ref 0.00–0.07)
Basophils Absolute: 0 K/uL (ref 0.0–0.1)
Basophils Relative: 0 %
Eosinophils Absolute: 0 K/uL (ref 0.0–0.5)
Eosinophils Relative: 0 %
HCT: 36.8 % — ABNORMAL LOW (ref 39.0–52.0)
Hemoglobin: 11.9 g/dL — ABNORMAL LOW (ref 13.0–17.0)
Immature Granulocytes: 1 %
Lymphocytes Relative: 11 %
Lymphs Abs: 1.1 K/uL (ref 0.7–4.0)
MCH: 27.4 pg (ref 26.0–34.0)
MCHC: 32.3 g/dL (ref 30.0–36.0)
MCV: 84.6 fL (ref 80.0–100.0)
Monocytes Absolute: 0.2 K/uL (ref 0.1–1.0)
Monocytes Relative: 3 %
Neutro Abs: 8.1 K/uL — ABNORMAL HIGH (ref 1.7–7.7)
Neutrophils Relative %: 85 %
Platelets: 184 K/uL (ref 150–400)
RBC: 4.35 MIL/uL (ref 4.22–5.81)
RDW: 15.3 % (ref 11.5–15.5)
WBC: 9.5 K/uL (ref 4.0–10.5)
nRBC: 0 % (ref 0.0–0.2)

## 2024-06-28 LAB — BASIC METABOLIC PANEL WITH GFR
Anion gap: 9 (ref 5–15)
BUN: 9 mg/dL (ref 6–20)
CO2: 26 mmol/L (ref 22–32)
Calcium: 8.3 mg/dL — ABNORMAL LOW (ref 8.9–10.3)
Chloride: 101 mmol/L (ref 98–111)
Creatinine, Ser: 0.76 mg/dL (ref 0.61–1.24)
GFR, Estimated: >60 mL/min
Glucose, Bld: 102 mg/dL — ABNORMAL HIGH (ref 70–99)
Potassium: 3.3 mmol/L — ABNORMAL LOW (ref 3.5–5.1)
Sodium: 136 mmol/L (ref 135–145)

## 2024-06-28 LAB — MAGNESIUM: Magnesium: 1.8 mg/dL (ref 1.7–2.4)

## 2024-06-28 MED ORDER — MORPHINE SULFATE (PF) 2 MG/ML IV SOLN
2.0000 mg | Freq: Once | INTRAVENOUS | Status: AC | PRN
  Administered 2024-06-28: 2 mg via INTRAVENOUS
  Filled 2024-06-28: qty 1

## 2024-06-28 MED ORDER — GUAIFENESIN-DM 100-10 MG/5ML PO SYRP
5.0000 mL | ORAL_SOLUTION | ORAL | Status: DC | PRN
  Administered 2024-06-28: 5 mL via ORAL
  Filled 2024-06-28 (×2): qty 5

## 2024-06-28 MED ORDER — POTASSIUM CHLORIDE 20 MEQ PO PACK
40.0000 meq | PACK | Freq: Once | ORAL | Status: AC
  Administered 2024-06-28: 40 meq via ORAL
  Filled 2024-06-28: qty 2

## 2024-06-28 MED ORDER — MORPHINE SULFATE (PF) 2 MG/ML IV SOLN
2.0000 mg | Freq: Once | INTRAVENOUS | Status: AC | PRN
  Administered 2024-06-29: 2 mg via INTRAVENOUS
  Filled 2024-06-28: qty 1

## 2024-06-28 NOTE — Progress Notes (Signed)
 Mobility Specialist Progress Note;   06/28/24 1007  Mobility  Activity Ambulated with assistance  Level of Assistance Standby assist, set-up cues, supervision of patient - no hands on  Assistive Device None  Distance Ambulated (ft) 100 ft  Activity Response Tolerated fair  Mobility Referral Yes  Mobility visit 1 Mobility  Mobility Specialist Start Time (ACUTE ONLY) 1007  Mobility Specialist Stop Time (ACUTE ONLY) 1023  Mobility Specialist Time Calculation (min) (ACUTE ONLY) 16 min    Pre-mobility: HR 96 bpm During-mobility: HR 108-160 bpm Post-mobility: HR 98 bpm  Pt in chair upon arrival, eager for mobility. Required no physical assistance during ambulation, SV for safety. Ambulated on 3LO2, SPO2 93-98% throughout. HR xtachy 160 bpm w/ exertion, pt ask, RN notified and aware. Recovered to 108 remainder of activity. Pt returned back to chair and left with all needs met, call bell in reach.   Lauraine Erm Mobility Specialist Please contact via SecureChat or [employer] [phone]

## 2024-06-28 NOTE — Progress Notes (Signed)
 " PROGRESS NOTE    Tyrone Fernandez  FMW:[id number] DOB: [birth year] DOA: 06/26/2024 PCP: Buck Search, PA-C  Subjective: No acute events overnight. Seen and examined at bedside. Reports feeling better with improvement in shortness of breath. Reports still with intermittent nausea but oral intake has increased. No further diarrhea since loperamide  started.    Hospital Course:   58 y.o. male with history of hypertension was brought to the ER after patient was having fever chills feeling short of breath and productive cough for the last few days which got acutely worsened.  Patient states he has been and some cough for last 2 weeks which he attributes to the sinus infection.  But over the last few days it has not progressively worse he started feeling generally weak tired and started having chills and rigors with fever at home.  Since he got worse and became weaker was brought to the ER.   Admitted for acute hypoxic respiratory failure, severe sepsis, acute influenza A infection with superimposed bacterial PNA, and non-bloody diarrhea likely viral gastroenteritis, and AKI. Found to have bilateral nephrolithiasis and mild L hydronephrosis.   Assessment and Plan:  Severe sepsis Acute influenza A infection Superimposed bacterial pneumonia  -  at presentation patient was hypotensive with fever leukocytosis elevated lactic acid level  - CT chest showing pneumonia and also had influenza A consistent with sepsis physiology.  - cont oseltamivir  for 5 days - cont azithromycin  500mg  3 days course - cont ceftriaxone  5 day course - stop IV fluids - encourage oral intake/hydration - wean oxygen as tolerated   Acute hypoxic respiratory failure - patient presented with acute cough, shortness of breath, fevers, chills and hypoxia - likely in the setting of acute influenza and bacterial PNA - currently on 3 < 4L [place], not on oxygen at home - see pneumonia management as elsewhere - cont incentive  spirometry - OOB to chair during daytime - ambulate as tolerated - wean oxygen as tolerated - will need outpatient sleep study referral to rule out OSA on discharge   Diarrhea - unclear if related to viral illness or bacterial etiology - as per patient, worsened since antibiotics started on admission - encourage oral intake/hydration - C diff testing negative  - cont loperamide  PRN for symptomatic relief if C diff negative   Lactic acidosis - resolved with IV hydration - encourage oral intake/hydration - monitor clinically   Mild L hydronephrosis Bilateral nephrolithiasis Acute renal insufficiency - AKI resolved - Cr 0.76 < 1.2, baseline 0.6-0.8 - UA unremarkable - CT A/P showed mild L hydronephrosis with urothelial thickening and peripelvic/proximal periureteral stranding, possibly due to chronic inflammation owing to stones in the renal pelvis ovus. No ureteral stones. 2 large stones at renal pelvis measuring 1.8 x 0.8cm and 1 x 1.6 cm. 1.3cm L upper pole kidney cyst and 2.1cm R inferior pole kidney cyst.  - Cr back to baseline with IV hydration - will need urology follow up outpatient for management of nephrolithiasis   History of hypertension - at presentation patient was hypotensive  - will hold patient's home triamterene-hydrochlorothiazide given AKI, resume if BP trending high and renal function remains stable   Hyponatremia - resolved with IV hydration   Hypokalemia  - monitor and replete electrolytes as needed  - monitor BMP   Hypocalcemia  Vitamin D  deficiency Cont vitamin D  supplement qWeekly Monitor and replete calcium  as needed    Hepatic steatosis  Seen on CT A/P Follow up outpatient  DVT prophylaxis:  Lovenox    Code Status: Full Code  Disposition Plan: Home Reason for continuing need for hospitalization: IV antibiotics, wean oxygen, monitor for symptomatic improvement  Objective: Vitals:   06/27/24 2035 06/28/24 0007 06/28/24 0455 06/28/24  0846  BP: 128/73 136/74 130/69 120/73  Pulse: (!) 108 (!) 128 (!) 102 (!) 101  Resp: 20 20 20 15   Temp: 99.4 F (37.4 C) 97.7 F (36.5 C) 98.9 F (37.2 C) 99.9 F (37.7 C)  TempSrc: Oral Oral Oral Oral  SpO2: 96% 95% 98% 94%  Weight:      Height:        Intake/Output Summary (Last 24 hours) at 06/28/2024 1117 Last data filed at 06/28/2024 0013 Gross per 24 hour  Intake 1847.39 ml  Output 475 ml  Net 1372.39 ml   Filed Weights   06/26/24 2151 06/27/24 1753  Weight: (!) 147 kg (!) 151.4 kg    Examination:  Physical Exam Vitals and nursing note reviewed.  Constitutional:      General: He is not in acute distress.    Appearance: He is obese. He is ill-appearing.     Comments: frail  HENT:     Head: Normocephalic and atraumatic.  Cardiovascular:     Rate and Rhythm: Normal rate and regular rhythm.     Pulses: Normal pulses.     Heart sounds: Normal heart sounds.  Pulmonary:     Effort: Pulmonary effort is normal. No respiratory distress.     Breath sounds: Normal breath sounds. No wheezing.  Abdominal:     General: Bowel sounds are normal. There is no distension.     Palpations: Abdomen is soft.     Tenderness: There is no abdominal tenderness.  Neurological:     Mental Status: He is alert.     Data Reviewed: I have personally reviewed following labs and imaging studies  CBC: Recent Labs  Lab 06/26/24 2212 06/27/24 0400 06/28/24 0812  WBC 18.5* 18.5* 9.5  NEUTROABS 17.0* 16.8* 8.1*  HGB 13.0 12.4* 11.9*  HCT 39.8 38.5* 36.8*  MCV 84.9 85.4 84.6  PLT 219 201 184   Basic Metabolic Panel: Recent Labs  Lab 06/26/24 2212 06/27/24 0400 06/28/24 0812  NA 134* 136 136  K 3.2* 3.4* 3.3*  CL 100 101 101  CO2 20* 23 26  GLUCOSE 119* 127* 102*  BUN 14 14 9   CREATININE 1.20 1.05 0.76  CALCIUM  7.7* 7.6* 8.3*  MG  --  1.4* 1.8   GFR: Estimated Creatinine Clearance: 146.8 mL/min (by C-G formula based on SCr of 0.76 mg/dL). Liver Function Tests: Recent  Labs  Lab 06/26/24 2212  AST 41  ALT 28  ALKPHOS 65  BILITOT 0.6  PROT 6.7  ALBUMIN 3.1*   Recent Labs  Lab 06/26/24 2212  LIPASE 20   No results for input(s): AMMONIA in the last 168 hours. Coagulation Profile: Recent Labs  Lab 06/26/24 2212  INR 1.1   Cardiac Enzymes: No results for input(s): CKTOTAL, CKMB, CKMBINDEX, TROPONINI in the last 168 hours. ProBNP, BNP (last 5 results) No results for input(s): PROBNP, BNP in the last 8760 hours. HbA1C: No results for input(s): HGBA1C in the last 72 hours. CBG: No results for input(s): GLUCAP in the last 168 hours. Lipid Profile: No results for input(s): CHOL, HDL, LDLCALC, TRIG, CHOLHDL, LDLDIRECT in the last 72 hours. Thyroid Function Tests: Recent Labs    06/27/24 0400  TSH 0.930   Anemia Panel: No results for input(s): VITAMINB12, FOLATE, FERRITIN, TIBC,  IRON, RETICCTPCT in the last 72 hours. Sepsis Labs: Recent Labs  Lab 06/26/24 2239 06/27/24 0035 06/27/24 0400 06/27/24 9371  LATICACIDVEN 2.2* 2.5* 2.5* 1.8    Recent Results (from the past 240 hours)  Blood Culture (routine x 2)     Status: None (Preliminary result)   Collection Time: 06/26/24 10:09 PM   Specimen: BLOOD  Result Value Ref Range Status   Specimen Description BLOOD SITE NOT SPECIFIED  Final   Special Requests   Final    BOTTLES DRAWN AEROBIC ONLY Blood Culture adequate volume   Culture   Final    NO GROWTH 2 DAYS Performed at [facility], [address] [place], [place] [address]    Report Status PENDING  Incomplete  Blood Culture (routine x 2)     Status: None (Preliminary result)   Collection Time: 06/26/24 10:14 PM   Specimen: BLOOD  Result Value Ref Range Status   Specimen Description BLOOD SITE NOT SPECIFIED  Final   Special Requests   Final    BOTTLES DRAWN AEROBIC AND ANAEROBIC Blood Culture adequate volume   Culture   Final    NO GROWTH 2 DAYS Performed at [facility], [address] [place], [place] [address]    Report Status PENDING  Incomplete  C Difficile Quick Screen w PCR reflex     Status: None   Collection Time: 06/27/24 12:38 PM   Specimen: STOOL  Result Value Ref Range Status   C Diff antigen NEGATIVE NEGATIVE Final   C Diff toxin NEGATIVE NEGATIVE Final   C Diff interpretation No C. difficile detected.  Final    Comment: Performed at [facility], [address], [place], [place] [address]     Radiology Studies: CT CHEST ABDOMEN PELVIS W CONTRAST Result Date: 06/27/2024 EXAM: CT CHEST, ABDOMEN AND PELVIS WITH CONTRAST 06/26/2024 11:40:33 PM TECHNIQUE: CT of the chest, abdomen and pelvis was performed with the administration of 75 mL of iohexol  (OMNIPAQUE ) 350 MG/ML injection. Multiplanar reformatted images are provided for review. Automated exposure control, iterative reconstruction, and/or weight based adjustment of the mA/kV was utilized to reduce the radiation dose to as low as reasonably achievable. COMPARISON: CT abdomen and pelvis without contrast 08/19/2021, PA and lateral chest today, PA and lateral chest 03/25/2024 and 07/17/2022. There is no prior chest CT. CLINICAL HISTORY: Concern for SBO as well as desiring a better characterization of infection in lungs. FINDINGS: CHEST: MEDIASTINUM AND LYMPH NODES: There is mild cardiomegaly. There is no pericardial effusion. View appreciable coronary calcifications. The pulmonary arteries and veins are normal in caliber. No central arterial embolism is seen. The thoracic aorta is tortuous. The thoracic aorta and great vessels are otherwise unremarkable. There is a mildly prominent subcarinal lymph node to the right measuring 10 mm on axial 34 of series 3. Similar mildly prominent right mid hilar lymph node on image 31. There is no bulky, encasing, or further adenopathy. There is a small hiatal hernia. There is a chronic right posterior diaphragmatic fat herniation. LUNGS AND PLEURA: There  is dense consolidation with air bronchograms in the left lower lobe, relatively sparing the superior and anterior segments. This is probably due to bacterial pneumonia or aspiration. There is diffuse bronchial thickening. Linear atelectasis in the right lower lobe. The lungs are otherwise clear. No nodules or pleural effusion noted. No pneumothorax. A follow-up CT is needed after treatment to ensure clearing. ABDOMEN AND PELVIS: LIVER: The liver is 23 cm  in length with moderate to severe increased steatosis since 2023. There is no mass. GALLBLADDER AND BILE DUCTS: Gallbladder is absent. No biliary ductal dilatation. SPLEEN: There is mild splenomegaly with an AP axis of 15.2 cm, similar to the previous exam. PANCREAS: There is fatty infiltration in the proximal pancreas without mass or ductal dilatation. ADRENAL GLANDS: There is no adrenal mass. KIDNEYS, URETERS AND BLADDER: Right kidney: There is a stable 2.1 cm Bosniak 2 cyst in the inferior pole of the right kidney with a single thin septation and dependent calcifications. There are small scattered sub-4 mm stones in the right renal collecting system. Left kidney: There is a stable 1.3 cm Bosniak 1 left upper pole cyst measuring 3.1 Hounsfield units. There are 2 larger stones in the left renal pelvis measuring 1.8 x 0.8 cm and 1 x 1.6 cm, and a few small calculi in the left mid and lower pole. Similar to the prior study, there is mild left hydronephrosis with urothelial thickening in the left renal pelvis and peripelvic and proximal periureteral stranding probably due to chronic inflammation. Active infectious process is not excluded. Per consensus, no follow-up is needed for simple Bosniak type 1 and 2 renal cysts, unless the patient has a malignancy history or risk factors. There are no ureteral stones. No perinephric stranding. Urinary bladder is unremarkable. GI AND BOWEL: Stomach demonstrates no acute abnormality. There is no bowel obstruction or  inflammation. An appendix is not seen in this patient. REPRODUCTIVE ORGANS: There is prostatomegaly with transverse axis enlargement 4.7 cm. PERITONEUM AND RETROPERITONEUM: No ascites. No free air. There was previously a large obstructing complex ventral hernia which has been repaired with intact repair. Laxity and protrusion of the abdominal wall continue to be noted. VASCULATURE: Aorta is normal in caliber. ABDOMINAL AND PELVIS LYMPH NODES: No lymphadenopathy. BONES AND SOFT TISSUES: Thoracic spine: There is moderate focal kypholevoscoliosis in the lower thoracic spine with chronic moderate anterior wedge compression fractures of T11, T12, and L1, with anterior bridging osteophytes and facet ankylosis. No acute or other significant osseous findings in the thorax. Lumbar spine: Degenerative changes and mild dextroscoliosis. Hips: Mild symmetric hip arthrosis. Soft tissues: Laxity and protrusion of the abdominal wall continue to be noted. IMPRESSION: 1. Dense consolidation with air bronchograms in the left lower lobe, likely due to bacterial pneumonia or aspiration. Follow-up CT is needed after treatment to ensure clearing. 2. No bowel obstruction or acute bowel inflammation. 3. Mild left hydronephrosis with urothelial thickening and peripelvic/proximal periureteral stranding, possibly due to chronic inflammation owing to stones in the renal pelvis ovus . Active infectious process is not excluded. No ureteral stones. 4. Bilateral nephrolithiasis, including two sizable stones in the left renal pelvis. 5. Enlarged liver with moderate to severe increased steatosis. Clinical correlation. 6. Prostatomegaly. Electronically signed by: Francis Quam MD 06/27/2024 12:27 AM EST RP Workstation: [id number]   DG Chest 2 View Result Date: 06/26/2024 EXAM: 2 VIEW(S) XRAY OF THE CHEST 06/26/2024 07:43:46 PM COMPARISON: 03/25/2024 CLINICAL HISTORY: fever FINDINGS: LUNGS AND PLEURA: There is focal consolidation within the  posterobasal left lower lobe, possibly infectious in the appropriate clinical setting. Follow-up chest radiograph is recommended in 3-4 weeks, following conservative therapy, to document resolution. If persistent at that time, dedicated contrast enhanced CT imaging of the chest is recommended for further evaluation. No pleural effusion. No pneumothorax. HEART AND MEDIASTINUM: No acute abnormality of the cardiac and mediastinal silhouettes. 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 IMPRESSION: 1. Focal consolidation within  the posterobasal left lower lobe, possibly infectious. Follow-up chest radiograph is recommended in 3-4 weeks, following conservative therapy, to document resolution. If persistent at that time, dedicated contrast enhanced CT imaging of the chest is recommended for further evaluation. 2. 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 Electronically signed by: Dorethia Molt MD 06/26/2024 08:13 PM EST RP Workstation: [id number]    Scheduled Meds:  enoxaparin  (LOVENOX ) injection  70 mg Subcutaneous Q24H   oseltamivir   75 mg Oral BID   potassium chloride   40 mEq Oral Once   Vitamin D  (Ergocalciferol )  50,000 Units Oral Q7 days   Continuous Infusions:  azithromycin  Stopped (06/27/24 2302)   cefTRIAXone  (ROCEPHIN )  IV Stopped (06/27/24 2146)     LOS: 1 day   Norval Bar, MD  [facility]  06/28/2024, 11:17 AM   "

## 2024-06-29 DIAGNOSIS — N179 Acute kidney failure, unspecified: Secondary | ICD-10-CM

## 2024-06-29 DIAGNOSIS — R6520 Severe sepsis without septic shock: Secondary | ICD-10-CM

## 2024-06-29 DIAGNOSIS — A419 Sepsis, unspecified organism: Secondary | ICD-10-CM | POA: Diagnosis not present

## 2024-06-29 LAB — BASIC METABOLIC PANEL WITH GFR
Anion gap: 6 (ref 5–15)
BUN: 9 mg/dL (ref 6–20)
CO2: 31 mmol/L (ref 22–32)
Calcium: 8.4 mg/dL — ABNORMAL LOW (ref 8.9–10.3)
Chloride: 102 mmol/L (ref 98–111)
Creatinine, Ser: 0.76 mg/dL (ref 0.61–1.24)
GFR, Estimated: >60 mL/min
Glucose, Bld: 96 mg/dL (ref 70–99)
Potassium: 4.1 mmol/L (ref 3.5–5.1)
Sodium: 139 mmol/L (ref 135–145)

## 2024-06-29 MED ORDER — ALPRAZOLAM 0.5 MG PO TABS
0.5000 mg | ORAL_TABLET | Freq: Once | ORAL | Status: AC | PRN
  Administered 2024-06-29: 0.5 mg via ORAL
  Filled 2024-06-29: qty 1

## 2024-06-29 NOTE — Progress Notes (Addendum)
 Mobility Specialist: Progress Note   06/29/24 1600  Mobility  Activity Ambulated with assistance  Level of Assistance Standby assist, set-up cues, supervision of patient - no hands on  Assistive Device None  Distance Ambulated (ft) 300 ft  Activity Response Tolerated well  Mobility Referral Yes  Mobility visit 1 Mobility  Mobility Specialist Start Time (ACUTE ONLY) 1150  Mobility Specialist Stop Time (ACUTE ONLY) 1207  Mobility Specialist Time Calculation (min) (ACUTE ONLY) 17 min    Pt received in chair, agreeable to mobility session. SV throughout. Received on RA, SpO2 92% while sitting. Ambulated down the hallway and back, SpO2 87-90% on RA. HR peaked to 113bpm. No complaints. Returned to room and pt SpO2 recovered to 94% on RA. Left in chair with all needs met, call bell in reach.   Ileana Lute Mobility Specialist Please contact via SecureChat or Rehab office at [phone]

## 2024-06-29 NOTE — Plan of Care (Signed)

## 2024-06-29 NOTE — Progress Notes (Signed)
 " PROGRESS NOTE    Tyrone Fernandez  FMW:[id number] DOB: [birth year] DOA: 06/26/2024 PCP: Buck Search, PA-C   Brief Narrative:  This 58 y.o. male with history of hypertension was brought to the ER after patient was having fever,  chills,  feeling short of breath and has productive cough for the last few days which got acutely worsened.  Patient states he has cough for last 2 weeks which he attributes to the sinus infection.  But over the last few days it has progressively worse,  he started feeling generally weak tired and started having chills and rigors with fever at home.  Since he got worse and became weaker was brought to the ER.    Admitted for acute hypoxic respiratory failure, severe sepsis, acute influenza A infection with superimposed bacterial PNA, and non-bloody diarrhea likely viral gastroenteritis, and AKI. Found to have bilateral nephrolithiasis and mild L hydronephrosis.  Assessment & Plan:   Principal Problem:   Sepsis (HCC) Active Problems:   Influenza A with pneumonia   CAP (community acquired pneumonia)   Essential hypertension   Hypokalemia   Hyponatremia   Hypocalcemia   Severe sepsis: Acute influenza A infection: Superimposed bacterial pneumonia: Patient was hypotensive with fever,  leukocytosis, elevated lactic acid level at presentation. CT chest showing pneumonia and also had influenza A consistent with sepsis physiology.  Continue oseltamivir  for 5 days. Continue azithromycin  500mg  3 days course. Continue ceftriaxone  5 day course. Encourage oral intake/hydration Wean oxygen as tolerated.   Acute hypoxic respiratory failure: Patient presented with acute cough, shortness of breath, fevers, chills and hypoxia Likely in the setting of acute influenza and bacterial PNA Currently on 3 < 4L [place], not on oxygen at home. wean oxygen as tolerated Continue incentive spirometry OOB to chair during daytime Ambulate as tolerated Needs outpatient sleep study  referral to rule out OSA on discharge   Diarrhea: Unclear if related to viral illness or bacterial etiology As per patient, worsened since antibiotics started on admission Encourage oral intake/hydration C. diff testing negative  Continue loperamide  PRN for symptomatic relief if C diff negative.   Lactic acidosis: Resolved with IV hydration. Encourage oral intake/hydration. Monitor clinically.   Mild L hydronephrosis: Bilateral Nephrolithiasis: Acute renal insufficiency: AKI resolved. Cr 0.76 < 1.2, baseline 0.6-0.8 UA unremarkable CT A/P showed mild L hydronephrosis with urothelial thickening and peripelvic /proximal periureteral stranding, possibly due to chronic inflammation owing to stones in the renal pelvis ovus. No ureteral stones. 2 large stones at renal pelvis measuring 1.8 x 0.8cm and 1 x 1.6 cm. 1.3cm L upper pole kidney cyst and 2.1cm R inferior pole kidney cyst.  Creatinine back to baseline with IV hydration. Needs  follow up outpatient for management of nephrolithiasis.   History of hypertension: Patient was hypertensive on presentation. Hold triamterene-hydrochlorothiazide given AKI, resume if BP trending high and renal function remains stable.   Hyponatremia: Resolved with IV hydration.   Hypokalemia: Replaced.  Continue to monitor.   Hypocalcemia : Vitamin D  deficiency Cont vitamin D  supplement qWeekly Monitor and replete calcium  as needed    Hepatic steatosis : Seen on CT A/P Follow up outpatient   DVT prophylaxis: Lovenox  Code Status: Full code Family Communication: No family at bed side. Disposition Plan:     Status is: Inpatient Remains inpatient appropriate because: Admitted for acute hypoxic respiratory failure likely secondary to sepsis and pneumonia. Patient is not medically ready for discharge.   Consultants:  None  Procedures: None Antimicrobials:  Anti-infectives (From admission,  onward)    Start     Dose/Rate Route Frequency  Ordered Stop   06/27/24 2200  cefTRIAXone  (ROCEPHIN ) 2 g in sodium chloride  0.9 % 100 mL IVPB        2 g 200 mL/hr over 30 Minutes Intravenous Every 24 hours 06/27/24 0311 07/01/24 2159   06/27/24 2200  azithromycin  (ZITHROMAX ) 500 mg in sodium chloride  0.9 % 250 mL IVPB  Status:  Discontinued        500 mg 250 mL/hr over 60 Minutes Intravenous Every 24 hours 06/27/24 0312 06/27/24 1225   06/27/24 2200  azithromycin  (ZITHROMAX ) 500 mg in sodium chloride  0.9 % 250 mL IVPB        500 mg 250 mL/hr over 60 Minutes Intravenous Every 24 hours 06/27/24 1225 06/28/24 2346   06/27/24 0315  oseltamivir  (TAMIFLU ) capsule 75 mg        75 mg Oral 2 times daily 06/27/24 0311 07/01/24 2159   06/27/24 0045  Ampicillin -Sulbactam (UNASYN ) 3 g in sodium chloride  0.9 % 100 mL IVPB        3 g 200 mL/hr over 30 Minutes Intravenous  Once 06/27/24 0033 06/27/24 0202   06/26/24 2230  cefTRIAXone  (ROCEPHIN ) 1 g in sodium chloride  0.9 % 100 mL IVPB        1 g 200 mL/hr over 30 Minutes Intravenous  Once 06/26/24 2224 06/26/24 2305   06/26/24 2230  azithromycin  (ZITHROMAX ) 500 mg in sodium chloride  0.9 % 250 mL IVPB        500 mg 250 mL/hr over 60 Minutes Intravenous  Once 06/26/24 2224 06/27/24 0121      Subjective: Patient was seen and examined at bedside.  Overnight events noted. Patient was sitting comfortably on the recliner,  states he is feeling better but still has some shortness of breath with exertion.  Objective: Vitals:   06/29/24 0027 06/29/24 0429 06/29/24 0839 06/29/24 1227  BP: 113/72 (!) 101/58 105/62 104/60  Pulse: 94 97 97 87  Resp: 20 14 20 20   Temp: 98.3 F (36.8 C) 98.4 F (36.9 C) 99.4 F (37.4 C) 98.5 F (36.9 C)  TempSrc: Oral Oral Oral Oral  SpO2:  99% 91% 92%  Weight:      Height:        Intake/Output Summary (Last 24 hours) at 06/29/2024 1457 Last data filed at 06/29/2024 0428 Gross per 24 hour  Intake 373.29 ml  Output 325 ml  Net 48.29 ml   Filed Weights    06/26/24 2151 06/27/24 1753  Weight: (!) 147 kg (!) 151.4 kg    Examination:  General exam: Appears calm and comfortable, Not in any distress.  Respiratory system: CTA Bilaterally. Respiratory effort normal. RR 17 Cardiovascular system: S1 & S2 heard, RRR. No JVD, murmurs, rubs, gallops or clicks.  Gastrointestinal system: Abdomen is non distended, soft and non tender. Normal bowel sounds heard. Central nervous system: Alert and oriented x 3. No focal neurological deficits. Extremities: No edema, no cyanosis, no clubbing. Skin: No rashes, lesions or ulcers Psychiatry: Judgement and insight appear normal. Mood & affect appropriate.     Data Reviewed: I have personally reviewed following labs and imaging studies  CBC: Recent Labs  Lab 06/26/24 2212 06/27/24 0400 06/28/24 0812  WBC 18.5* 18.5* 9.5  NEUTROABS 17.0* 16.8* 8.1*  HGB 13.0 12.4* 11.9*  HCT 39.8 38.5* 36.8*  MCV 84.9 85.4 84.6  PLT 219 201 184   Basic Metabolic Panel: Recent Labs  Lab 06/26/24 2212 06/27/24  0400 06/28/24 0812 06/29/24 0419  NA 134* 136 136 139  K 3.2* 3.4* 3.3* 4.1  CL 100 101 101 102  CO2 20* 23 26 31   GLUCOSE 119* 127* 102* 96  BUN 14 14 9 9   CREATININE 1.20 1.05 0.76 0.76  CALCIUM  7.7* 7.6* 8.3* 8.4*  MG  --  1.4* 1.8  --    GFR: Estimated Creatinine Clearance: 146.8 mL/min (by C-G formula based on SCr of 0.76 mg/dL). Liver Function Tests: Recent Labs  Lab 06/26/24 2212  AST 41  ALT 28  ALKPHOS 65  BILITOT 0.6  PROT 6.7  ALBUMIN 3.1*   Recent Labs  Lab 06/26/24 2212  LIPASE 20   No results for input(s): AMMONIA in the last 168 hours. Coagulation Profile: Recent Labs  Lab 06/26/24 2212  INR 1.1   Cardiac Enzymes: No results for input(s): CKTOTAL, CKMB, CKMBINDEX, TROPONINI in the last 168 hours. BNP (last 3 results) No results for input(s): PROBNP in the last 8760 hours. HbA1C: No results for input(s): HGBA1C in the last 72 hours. CBG: No results  for input(s): GLUCAP in the last 168 hours. Lipid Profile: No results for input(s): CHOL, HDL, LDLCALC, TRIG, CHOLHDL, LDLDIRECT in the last 72 hours. Thyroid Function Tests: Recent Labs    06/27/24 0400  TSH 0.930   Anemia Panel: No results for input(s): VITAMINB12, FOLATE, FERRITIN, TIBC, IRON, RETICCTPCT in the last 72 hours. Sepsis Labs: Recent Labs  Lab 06/26/24 2239 06/27/24 0035 06/27/24 0400 06/27/24 9371  LATICACIDVEN 2.2* 2.5* 2.5* 1.8    Recent Results (from the past 240 hours)  Blood Culture (routine x 2)     Status: None (Preliminary result)   Collection Time: 06/26/24 10:09 PM   Specimen: BLOOD  Result Value Ref Range Status   Specimen Description BLOOD SITE NOT SPECIFIED  Final   Special Requests   Final    BOTTLES DRAWN AEROBIC ONLY Blood Culture adequate volume   Culture   Final    NO GROWTH 3 DAYS Performed at [facility], [address] [place], [place] [address]    Report Status PENDING  Incomplete  Blood Culture (routine x 2)     Status: None (Preliminary result)   Collection Time: 06/26/24 10:14 PM   Specimen: BLOOD  Result Value Ref Range Status   Specimen Description BLOOD SITE NOT SPECIFIED  Final   Special Requests   Final    BOTTLES DRAWN AEROBIC AND ANAEROBIC Blood Culture adequate volume   Culture   Final    NO GROWTH 3 DAYS Performed at [facility], [address] [place], [place] [address]    Report Status PENDING  Incomplete  C Difficile Quick Screen w PCR reflex     Status: None   Collection Time: 06/27/24 12:38 PM   Specimen: STOOL  Result Value Ref Range Status   C Diff antigen NEGATIVE NEGATIVE Final   C Diff toxin NEGATIVE NEGATIVE Final   C Diff interpretation No C. difficile detected.  Final    Comment: Performed at [facility], [address] [place], [place] [address]    Radiology Studies: No results found.  Scheduled Meds:  enoxaparin  (LOVENOX ) injection  70 mg  Subcutaneous Q24H   oseltamivir   75 mg Oral BID   Vitamin D  (Ergocalciferol )  50,000 Units Oral Q7 days   Continuous Infusions:  cefTRIAXone  (ROCEPHIN )  IV Stopped (06/28/24 2315)     LOS: 2 days    Time spent:  50 mins    Darcel Dawley, MD [facility]   If 7PM-7AM, please contact night-coverage  "

## 2024-06-29 NOTE — Progress Notes (Signed)
 Pt has been anxious all day. Keeps asking about swelling in legs which I do no see. Also asking about something to help him sleep tonight. Initially asked me about something for anxiety. Rec'd morphine  last night for back pain and asking about that as well. Pt has melatonin ordered for sleep but feels he needs something for anxiety.  Pt continues to need oxygen at bedtime for sleep apnea. Pt would like to get medications early on PM shift and be allowed to sleep at least until 0500 for lab draws. Ann Nena Hoard, RN

## 2024-06-29 NOTE — Plan of Care (Signed)

## 2024-06-29 NOTE — Progress Notes (Signed)
 Pt appears to have severe sleep apnea. Oxygen saturations mid 90's on room air unless sleeping. Saturations drop while sleeping. Pt states he has a sleep study interview on 07/12/24. I encouraged pt to sleep in more upright position to improve oxygenation. Pt has been having this issue for a while but sickness has made it worse. Ann Nena Hoard, RN

## 2024-06-29 NOTE — Progress Notes (Signed)
 Pt called with concerns for ankle swelling. Ankles appear larger than this morning. Will notify MD to assess. Ann Nena Hoard, RN

## 2024-06-30 DIAGNOSIS — R6520 Severe sepsis without septic shock: Secondary | ICD-10-CM | POA: Diagnosis not present

## 2024-06-30 DIAGNOSIS — A419 Sepsis, unspecified organism: Secondary | ICD-10-CM | POA: Diagnosis not present

## 2024-06-30 DIAGNOSIS — N179 Acute kidney failure, unspecified: Secondary | ICD-10-CM | POA: Diagnosis not present

## 2024-06-30 MED ORDER — DIPHENHYDRAMINE HCL 25 MG PO CAPS
25.0000 mg | ORAL_CAPSULE | Freq: Once | ORAL | Status: AC
  Administered 2024-06-30: 25 mg via ORAL
  Filled 2024-06-30: qty 1

## 2024-06-30 NOTE — Progress Notes (Signed)
 Mobility Specialist Progress Note;   06/30/24 1108  Mobility  Activity Ambulated independently  Level of Assistance Standby assist, set-up cues, supervision of patient - no hands on  Assistive Device None  Distance Ambulated (ft) 250 ft  Activity Response Tolerated well  Mobility Referral Yes  Mobility visit 1 Mobility  Mobility Specialist Start Time (ACUTE ONLY) 1108  Mobility Specialist Stop Time (ACUTE ONLY) 1120  Mobility Specialist Time Calculation (min) (ACUTE ONLY) 12 min   RN requesting walking O2 on pt, pt eager. Required no physical assistance during ambulation, SV for safety. Able to ambulate on RA, SPO2 maintained 92-95%. No c/o when asked. Pt returned to chair and left with all needs met, call bell in reach. RN notified.   Lauraine Erm Mobility Specialist Please contact via SecureChat or [employer] [phone]

## 2024-06-30 NOTE — Progress Notes (Signed)
 Nurse requested Mobility Specialist to perform oxygen saturation test with pt which includes removing pt from oxygen both at rest and while ambulating.  Below are the results from that testing.     Patient Saturations on Room Air at Rest = spO2 96%  Patient Saturations on Room Air while Ambulating = sp02 92% .    Patient Saturations on 0 Liters of oxygen while Ambulating = sp02 92%  At end of testing pt left in room on 0  Liters of oxygen.  Reported results to nurse.    Lauraine Erm Mobility Specialist Please contact via SecureChat or [employer] [phone]

## 2024-06-30 NOTE — Progress Notes (Signed)
 " PROGRESS NOTE    Tyrone Fernandez  FMW:[id number] DOB: 05-30-1966 DOA: 06/26/2024 PCP: Buck Search, PA-C   Brief Narrative:  This 59 y.o. male with history of hypertension was brought to the ER after patient was having fever,  chills,  feeling short of breath and has productive cough for the last few days which got acutely worsened.  Patient states he has cough for last 2 weeks which he attributes to the sinus infection.  But over the last few days it has progressively worse,  he started feeling generally weak tired and started having chills and rigors with fever at home.  Since he got worse and became weaker was brought to the ER.    Admitted for acute hypoxic respiratory failure, severe sepsis, acute influenza A infection with superimposed bacterial PNA, and non-bloody diarrhea likely viral gastroenteritis, and AKI. Found to have bilateral nephrolithiasis and mild L hydronephrosis.  Assessment & Plan:   Principal Problem:   Sepsis (HCC) Active Problems:   Influenza A with pneumonia   CAP (community acquired pneumonia)   Essential hypertension   Hypokalemia   Hyponatremia   Hypocalcemia   Severe sepsis: Acute influenza A infection: Superimposed bacterial pneumonia: Patient was hypotensive with fever,  leukocytosis, elevated lactic acid level at presentation. CT chest showing pneumonia and also had influenza A consistent with sepsis physiology.  Continue oseltamivir  for 5 days. Continue azithromycin  500mg  3 days course. Continue ceftriaxone  5 day course. Encourage oral intake/hydration Wean oxygen as tolerated.   Acute hypoxic respiratory failure: Patient presented with acute cough, shortness of breath, fevers, chills and hypoxia Likely in the setting of acute influenza and bacterial PNA Currently on 3 < 4L [place], not on oxygen at home. Weaned down to room air. Continue incentive spirometry OOB to chair during daytime Ambulate as tolerated Needs outpatient sleep study  referral to rule out OSA on discharge   Diarrhea: Unclear if related to viral illness or bacterial etiology As per patient, worsened since antibiotics started on admission Encourage oral intake/hydration C. diff testing negative  Continue loperamide  PRN for symptomatic relief if C diff negative.   Lactic acidosis: Resolved with IV hydration. Encourage oral intake/hydration. Monitor clinically.   Mild L hydronephrosis: Bilateral Nephrolithiasis: Acute renal insufficiency: AKI resolved. Cr 0.76 < 1.2, baseline 0.6-0.8 UA unremarkable CT A/P showed mild L hydronephrosis with urothelial thickening and peripelvic /proximal periureteral stranding, possibly due to chronic inflammation owing to stones in the renal pelvis ovus. No ureteral stones. 2 large stones at renal pelvis measuring 1.8 x 0.8cm and 1 x 1.6 cm. 1.3cm L upper pole kidney cyst and 2.1cm R inferior pole kidney cyst.  Creatinine back to baseline with IV hydration. Needs  follow up outpatient for management of nephrolithiasis.   History of hypertension: Patient was hypertensive on presentation. Hold triamterene-hydrochlorothiazide given AKI, resume if BP trending high and renal function remains stable.   Hyponatremia: Resolved with IV hydration.   Hypokalemia: Replaced.  Continue to monitor.   Hypocalcemia : Vitamin D  deficiency Cont vitamin D  supplement qWeekly Monitor and replete calcium  as needed    Hepatic steatosis : Seen on CT A/P Follow up outpatient   DVT prophylaxis: Lovenox  Code Status: Full code Family Communication: No family at bed side. Disposition Plan:     Status is: Inpatient Remains inpatient appropriate because: Admitted for acute hypoxic respiratory failure likely secondary to sepsis and pneumonia.  Patient successfully weaned down to room air.  Will anticipated be discharged tomorrow.   Consultants:  None  Procedures: None Antimicrobials:  Anti-infectives (From admission, onward)     Start     Dose/Rate Route Frequency Ordered Stop   06/27/24 2200  cefTRIAXone  (ROCEPHIN ) 2 g in sodium chloride  0.9 % 100 mL IVPB        2 g 200 mL/hr over 30 Minutes Intravenous Every 24 hours 06/27/24 0311 07/01/24 2159   06/27/24 2200  azithromycin  (ZITHROMAX ) 500 mg in sodium chloride  0.9 % 250 mL IVPB  Status:  Discontinued        500 mg 250 mL/hr over 60 Minutes Intravenous Every 24 hours 06/27/24 0312 06/27/24 1225   06/27/24 2200  azithromycin  (ZITHROMAX ) 500 mg in sodium chloride  0.9 % 250 mL IVPB        500 mg 250 mL/hr over 60 Minutes Intravenous Every 24 hours 06/27/24 1225 06/28/24 2346   06/27/24 0315  oseltamivir  (TAMIFLU ) capsule 75 mg        75 mg Oral 2 times daily 06/27/24 0311 07/01/24 2159   06/27/24 0045  Ampicillin -Sulbactam (UNASYN ) 3 g in sodium chloride  0.9 % 100 mL IVPB        3 g 200 mL/hr over 30 Minutes Intravenous  Once 06/27/24 0033 06/27/24 0202   06/26/24 2230  cefTRIAXone  (ROCEPHIN ) 1 g in sodium chloride  0.9 % 100 mL IVPB        1 g 200 mL/hr over 30 Minutes Intravenous  Once 06/26/24 2224 06/26/24 2305   06/26/24 2230  azithromycin  (ZITHROMAX ) 500 mg in sodium chloride  0.9 % 250 mL IVPB        500 mg 250 mL/hr over 60 Minutes Intravenous  Once 06/26/24 2224 06/27/24 0121      Subjective: Patient was seen and examined at bedside.  Overnight events noted. Patient was sitting comfortably on the recliner,  states he is feeling better. He still reports shortness of breath with exertion.  Objective: Vitals:   06/29/24 1932 06/30/24 0005 06/30/24 0351 06/30/24 0835  BP: 113/86 118/63 102/76 121/71  Pulse: 82 81 83 90  Resp: 18 17 20 17   Temp: 98.5 F (36.9 C) 97.7 F (36.5 C) (!) 97.5 F (36.4 C) 97.6 F (36.4 C)  TempSrc: Oral Oral Oral Oral  SpO2: 93% 96% 95%   Weight:      Height:       No intake or output data in the 24 hours ending 06/30/24 1617  Filed Weights   06/26/24 2151 06/27/24 1753  Weight: (!) 147 kg (!) 151.4 kg     Examination:  General exam: Appears calm and comfortable, Not in any distress.  Respiratory system: CTA Bilaterally. Respiratory effort normal. RR 16 Cardiovascular system: S1 & S2 heard, RRR. No JVD, murmurs, rubs, gallops or clicks.  Gastrointestinal system: Abdomen is non distended, soft and non tender. Normal bowel sounds heard. Central nervous system: Alert and oriented x 3. No focal neurological deficits. Extremities: No edema, no cyanosis, no clubbing. Skin: No rashes, lesions or ulcers Psychiatry: Judgement and insight appear normal. Mood & affect appropriate.     Data Reviewed: I have personally reviewed following labs and imaging studies  CBC: Recent Labs  Lab 06/26/24 2212 06/27/24 0400 06/28/24 0812  WBC 18.5* 18.5* 9.5  NEUTROABS 17.0* 16.8* 8.1*  HGB 13.0 12.4* 11.9*  HCT 39.8 38.5* 36.8*  MCV 84.9 85.4 84.6  PLT 219 201 184   Basic Metabolic Panel: Recent Labs  Lab 06/26/24 2212 06/27/24 0400 06/28/24 0812 06/29/24 0419  NA 134* 136 136 139  K 3.2*  3.4* 3.3* 4.1  CL 100 101 101 102  CO2 20* 23 26 31   GLUCOSE 119* 127* 102* 96  BUN 14 14 9 9   CREATININE 1.20 1.05 0.76 0.76  CALCIUM  7.7* 7.6* 8.3* 8.4*  MG  --  1.4* 1.8  --    GFR: Estimated Creatinine Clearance: 146.8 mL/min (by C-G formula based on SCr of 0.76 mg/dL). Liver Function Tests: Recent Labs  Lab 06/26/24 2212  AST 41  ALT 28  ALKPHOS 65  BILITOT 0.6  PROT 6.7  ALBUMIN 3.1*   Recent Labs  Lab 06/26/24 2212  LIPASE 20   No results for input(s): AMMONIA in the last 168 hours. Coagulation Profile: Recent Labs  Lab 06/26/24 2212  INR 1.1   Cardiac Enzymes: No results for input(s): CKTOTAL, CKMB, CKMBINDEX, TROPONINI in the last 168 hours. BNP (last 3 results) No results for input(s): PROBNP in the last 8760 hours. HbA1C: No results for input(s): HGBA1C in the last 72 hours. CBG: No results for input(s): GLUCAP in the last 168 hours. Lipid  Profile: No results for input(s): CHOL, HDL, LDLCALC, TRIG, CHOLHDL, LDLDIRECT in the last 72 hours. Thyroid Function Tests: No results for input(s): TSH, T4TOTAL, FREET4, T3FREE, THYROIDAB in the last 72 hours.  Anemia Panel: No results for input(s): VITAMINB12, FOLATE, FERRITIN, TIBC, IRON, RETICCTPCT in the last 72 hours. Sepsis Labs: Recent Labs  Lab 06/26/24 2239 06/27/24 0035 06/27/24 0400 06/27/24 9371  LATICACIDVEN 2.2* 2.5* 2.5* 1.8    Recent Results (from the past 240 hours)  Blood Culture (routine x 2)     Status: None (Preliminary result)   Collection Time: 06/26/24 10:09 PM   Specimen: BLOOD  Result Value Ref Range Status   Specimen Description BLOOD SITE NOT SPECIFIED  Final   Special Requests   Final    BOTTLES DRAWN AEROBIC ONLY Blood Culture adequate volume   Culture   Final    NO GROWTH 4 DAYS Performed at [facility], [address], [place], [place] [address]    Report Status PENDING  Incomplete  Blood Culture (routine x 2)     Status: None (Preliminary result)   Collection Time: 06/26/24 10:14 PM   Specimen: BLOOD  Result Value Ref Range Status   Specimen Description BLOOD SITE NOT SPECIFIED  Final   Special Requests   Final    BOTTLES DRAWN AEROBIC AND ANAEROBIC Blood Culture adequate volume   Culture   Final    NO GROWTH 4 DAYS Performed at [facility], [address] [place], [place] [address]    Report Status PENDING  Incomplete  C Difficile Quick Screen w PCR reflex     Status: None   Collection Time: 06/27/24 12:38 PM   Specimen: STOOL  Result Value Ref Range Status   C Diff antigen NEGATIVE NEGATIVE Final   C Diff toxin NEGATIVE NEGATIVE Final   C Diff interpretation No C. difficile detected.  Final    Comment: Performed at [facility], [address], [place], [place] [address]    Radiology Studies: No results found.  Scheduled Meds:  enoxaparin  (LOVENOX ) injection  70 mg  Subcutaneous Q24H   oseltamivir   75 mg Oral BID   Vitamin D  (Ergocalciferol )  50,000 Units Oral Q7 days   Continuous Infusions:  cefTRIAXone  (ROCEPHIN )  IV Stopped (06/29/24 2136)     LOS: 3 days    Time spent: 35 mins    Darcel Dawley, MD [facility]  If 7PM-7AM, please contact night-coverage  "

## 2024-07-01 ENCOUNTER — Inpatient Hospital Stay (HOSPITAL_COMMUNITY)

## 2024-07-01 DIAGNOSIS — A419 Sepsis, unspecified organism: Secondary | ICD-10-CM | POA: Diagnosis not present

## 2024-07-01 DIAGNOSIS — R6520 Severe sepsis without septic shock: Secondary | ICD-10-CM | POA: Diagnosis not present

## 2024-07-01 DIAGNOSIS — N179 Acute kidney failure, unspecified: Secondary | ICD-10-CM | POA: Diagnosis not present

## 2024-07-01 LAB — CULTURE, BLOOD (ROUTINE X 2)
Culture: NO GROWTH
Culture: NO GROWTH
Special Requests: ADEQUATE
Special Requests: ADEQUATE

## 2024-07-01 MED ORDER — OSELTAMIVIR PHOSPHATE 75 MG PO CAPS: 75.0000 mg | ORAL_CAPSULE | Freq: Two times a day (BID) | ORAL | 0 refills | Status: AC

## 2024-07-01 MED ORDER — ALPRAZOLAM 0.5 MG PO TABS
0.5000 mg | ORAL_TABLET | Freq: Three times a day (TID) | ORAL | Status: DC | PRN
  Administered 2024-07-01: 0.5 mg via ORAL
  Filled 2024-07-01: qty 1

## 2024-07-01 MED ORDER — VITAMIN D (ERGOCALCIFEROL) 1.25 MG (50000 UNIT) PO CAPS: 50000.0000 [IU] | ORAL_CAPSULE | ORAL | 0 refills | Status: AC

## 2024-07-01 MED ORDER — GUAIFENESIN-DM 100-10 MG/5ML PO SYRP: 5.0000 mL | ORAL_SOLUTION | ORAL | 0 refills | Status: AC | PRN

## 2024-07-01 MED ORDER — INFLUENZA VIRUS VACC SPLIT PF (FLUZONE) 0.5 ML IM SUSY: 0.5000 mL | PREFILLED_SYRINGE | INTRAMUSCULAR | Status: DC

## 2024-07-01 NOTE — Discharge Summary (Signed)
 Physician Discharge Summary  NECHEMIA CHIAPPETTA FMW:[id number] DOB: [birth year] DOA: 06/26/2024  PCP: Buck Search, PA-C  Admit date: 06/26/2024  Discharge date: 07/01/2024  Admitted From: Home  Disposition:  Home  Recommendations for Outpatient Follow-up:  Follow up with PCP in 1-2 weeks. Please obtain BMP/CBC in one week. Advised to take Tamiflu  twice a day for 1 more day to complete course for 5 days.  Home Health: None Equipment/Devices:None  Discharge Condition: Stable CODE STATUS:Full code Diet recommendation: Heart Healthy   [facility] Course: This 59 y.o. male with history of hypertension was brought to the ER after patient was having fever,  chills,  feeling short of breath and has productive cough for the last few days which got acutely worsened.  Patient states he has cough for last 2 weeks which he attributes to the sinus infection.  But over the last few days it has progressively worse,  he started feeling generally weak tired and started having chills and rigors with fever at home.  Since he got worse and became weaker was brought to the ER.  Admitted for acute hypoxic respiratory failure, severe sepsis, acute influenza A infection with superimposed bacterial PNA, and non-bloody diarrhea likely viral gastroenteritis, and AKI. Found to have bilateral nephrolithiasis and mild L hydronephrosis.  Patient was continued on antibiotics and then Tamiflu .  Patient has made significant improvement and is successfully weaned down to room air.  AKI and diarrhea has improved.  Blood cultures no growth so far. Sepsis has resolved. Patient wants to be discharged home.   He is discharged home on Tamiflu  for 1 more day to complete 5-day course.   Discharge Diagnoses:  Principal Problem:   Sepsis (HCC) Active Problems:   Influenza A with pneumonia   CAP (community acquired pneumonia)   Essential hypertension   Hypokalemia   Hyponatremia   Hypocalcemia  Severe  sepsis: Acute influenza A infection: Superimposed bacterial pneumonia: Patient was hypotensive with fever,  leukocytosis, elevated lactic acid level at presentation. CT chest showing pneumonia and also had influenza A consistent with sepsis physiology.  Continue oseltamivir  for 5 days. Continue azithromycin  500mg  3 days course. Continue ceftriaxone  5 day course. Encourage oral intake/hydration Wean oxygen as tolerated.   Acute hypoxic respiratory failure: Patient presented with acute cough, shortness of breath, fevers, chills and hypoxia Likely in the setting of acute influenza and bacterial PNA Currently on 3 < 4L [place], not on oxygen at home. Weaned down to room air. Continue incentive spirometry OOB to chair during daytime Ambulate as tolerated Needs outpatient sleep study referral to rule out OSA on discharge Successfully weaned down to room air.   Diarrhea: Unclear if related to viral illness or bacterial etiology As per patient, worsened since antibiotics started on admission Encourage oral intake/hydration C. diff testing negative  Continue loperamide  PRN for symptomatic relief if C diff negative. Diarrhea resolved.   Lactic acidosis: Resolved with IV hydration. Encourage oral intake/hydration. Monitor clinically.   Mild L hydronephrosis: Bilateral Nephrolithiasis: Acute renal insufficiency: AKI resolved. Cr 0.76 < 1.2, baseline 0.6-0.8 UA unremarkable CT A/P showed mild L hydronephrosis with urothelial thickening and peripelvic /proximal periureteral stranding, possibly due to chronic inflammation owing to stones in the renal pelvis ovus. No ureteral stones. 2 large stones at renal pelvis measuring 1.8 x 0.8cm and 1 x 1.6 cm. 1.3cm L upper pole kidney cyst and 2.1cm R inferior pole kidney cyst.  Creatinine back to baseline with IV hydration. Needs  follow up outpatient for management  of nephrolithiasis.   History of hypertension: Patient was hypertensive on  presentation. Hold triamterene-hydrochlorothiazide given AKI, resume if BP trending high and renal function remains stable. Resumed home medications.   Hyponatremia: Resolved with IV hydration.   Hypokalemia: Replaced.  Continue to monitor.   Hypocalcemia : Vitamin D  deficiency Cont vitamin D  supplement qWeekly Monitor and replete calcium  as needed    Hepatic steatosis : Seen on CT A/P Follow up outpatient  Discharge Instructions  Discharge Instructions     Call MD for:  difficulty breathing, headache or visual disturbances   Complete by: As directed    Call MD for:  persistant nausea and vomiting   Complete by: As directed    Diet Carb Modified   Complete by: As directed    Discharge instructions   Complete by: As directed    Advised to follow-up with primary care physician in 1 week. Advised to take Tamiflu  twice a day for 1 more day to complete course for 5 days.   Increase activity slowly   Complete by: As directed       Allergies as of 07/01/2024       Reactions   Ambien [zolpidem] Anxiety   Pt hallucinates and has extreme anxiety.        Medication List     TAKE these medications    acetaminophen  500 MG tablet Commonly known as: TYLENOL  Take 1,000 mg by mouth every 6 (six) hours as needed for mild pain (pain score 1-3), headache or fever.   fluticasone  50 MCG/ACT nasal spray Commonly known as: FLONASE  Place 1 spray into both nostrils daily.   guaiFENesin -dextromethorphan  100-10 MG/5ML syrup Commonly known as: ROBITUSSIN DM Take 5 mLs by mouth every 4 (four) hours as needed for cough.   oseltamivir  75 MG capsule Commonly known as: TAMIFLU  Take 1 capsule (75 mg total) by mouth 2 (two) times daily for 1 day.   triamterene-hydrochlorothiazide 75-50 MG tablet Commonly known as: MAXZIDE Take 0.5 tablets by mouth daily.   Vitamin D  (Ergocalciferol ) 1.25 MG (50000 UNIT) Caps capsule Commonly known as: DRISDOL  Take 1 capsule (50,000 Units total) by  mouth every 7 (seven) days. Start taking on: July 05, 2024        Follow-up Information     Buck Search, PA-C Follow up.   Specialty: Physician Assistant Contact information: [address] [place] [address] [phone]                Allergies[1]  Consultations: None   Procedures/Studies: DG CHEST PORT 1 VIEW Result Date: 07/01/2024 CLINICAL DATA:  Cough. EXAM: PORTABLE CHEST 1 VIEW COMPARISON:  Radiograph and CT 06/26/2024 FINDINGS: No significant change in the left lung base opacity. Slight increase in right lung base atelectasis. Stable heart size and mediastinal contours. No pneumothorax or large pleural effusion. IMPRESSION: 1. No significant change in left lung base opacity. 2. Slight increase in right lung base atelectasis. Electronically Signed   By: Andrea Gasman M.D.   On: 07/01/2024 12:10   CT CHEST ABDOMEN PELVIS W CONTRAST Result Date: 06/27/2024 EXAM: CT CHEST, ABDOMEN AND PELVIS WITH CONTRAST 06/26/2024 11:40:33 PM TECHNIQUE: CT of the chest, abdomen and pelvis was performed with the administration of 75 mL of iohexol  (OMNIPAQUE ) 350 MG/ML injection. Multiplanar reformatted images are provided for review. Automated exposure control, iterative reconstruction, and/or weight based adjustment of the mA/kV was utilized to reduce the radiation dose to as low as reasonably achievable. COMPARISON: CT abdomen and pelvis without contrast 08/19/2021,  PA and lateral chest today, PA and lateral chest 03/25/2024 and 07/17/2022. There is no prior chest CT. CLINICAL HISTORY: Concern for SBO as well as desiring a better characterization of infection in lungs. FINDINGS: CHEST: MEDIASTINUM AND LYMPH NODES: There is mild cardiomegaly. There is no pericardial effusion. View appreciable coronary calcifications. The pulmonary arteries and veins are normal in caliber. No central arterial embolism is seen. The thoracic aorta is tortuous. The thoracic aorta and great vessels  are otherwise unremarkable. There is a mildly prominent subcarinal lymph node to the right measuring 10 mm on axial 34 of series 3. Similar mildly prominent right mid hilar lymph node on image 31. There is no bulky, encasing, or further adenopathy. There is a small hiatal hernia. There is a chronic right posterior diaphragmatic fat herniation. LUNGS AND PLEURA: There is dense consolidation with air bronchograms in the left lower lobe, relatively sparing the superior and anterior segments. This is probably due to bacterial pneumonia or aspiration. There is diffuse bronchial thickening. Linear atelectasis in the right lower lobe. The lungs are otherwise clear. No nodules or pleural effusion noted. No pneumothorax. A follow-up CT is needed after treatment to ensure clearing. ABDOMEN AND PELVIS: LIVER: The liver is 23 cm in length with moderate to severe increased steatosis since 2023. There is no mass. GALLBLADDER AND BILE DUCTS: Gallbladder is absent. No biliary ductal dilatation. SPLEEN: There is mild splenomegaly with an AP axis of 15.2 cm, similar to the previous exam. PANCREAS: There is fatty infiltration in the proximal pancreas without mass or ductal dilatation. ADRENAL GLANDS: There is no adrenal mass. KIDNEYS, URETERS AND BLADDER: Right kidney: There is a stable 2.1 cm Bosniak 2 cyst in the inferior pole of the right kidney with a single thin septation and dependent calcifications. There are small scattered sub-4 mm stones in the right renal collecting system. Left kidney: There is a stable 1.3 cm Bosniak 1 left upper pole cyst measuring 3.1 Hounsfield units. There are 2 larger stones in the left renal pelvis measuring 1.8 x 0.8 cm and 1 x 1.6 cm, and a few small calculi in the left mid and lower pole. Similar to the prior study, there is mild left hydronephrosis with urothelial thickening in the left renal pelvis and peripelvic and proximal periureteral stranding probably due to chronic inflammation. Active  infectious process is not excluded. Per consensus, no follow-up is needed for simple Bosniak type 1 and 2 renal cysts, unless the patient has a malignancy history or risk factors. There are no ureteral stones. No perinephric stranding. Urinary bladder is unremarkable. GI AND BOWEL: Stomach demonstrates no acute abnormality. There is no bowel obstruction or inflammation. An appendix is not seen in this patient. REPRODUCTIVE ORGANS: There is prostatomegaly with transverse axis enlargement 4.7 cm. PERITONEUM AND RETROPERITONEUM: No ascites. No free air. There was previously a large obstructing complex ventral hernia which has been repaired with intact repair. Laxity and protrusion of the abdominal wall continue to be noted. VASCULATURE: Aorta is normal in caliber. ABDOMINAL AND PELVIS LYMPH NODES: No lymphadenopathy. BONES AND SOFT TISSUES: Thoracic spine: There is moderate focal kypholevoscoliosis in the lower thoracic spine with chronic moderate anterior wedge compression fractures of T11, T12, and L1, with anterior bridging osteophytes and facet ankylosis. No acute or other significant osseous findings in the thorax. Lumbar spine: Degenerative changes and mild dextroscoliosis. Hips: Mild symmetric hip arthrosis. Soft tissues: Laxity and protrusion of the abdominal wall continue to be noted. IMPRESSION: 1. Dense consolidation with air  bronchograms in the left lower lobe, likely due to bacterial pneumonia or aspiration. Follow-up CT is needed after treatment to ensure clearing. 2. No bowel obstruction or acute bowel inflammation. 3. Mild left hydronephrosis with urothelial thickening and peripelvic/proximal periureteral stranding, possibly due to chronic inflammation owing to stones in the renal pelvis ovus . Active infectious process is not excluded. No ureteral stones. 4. Bilateral nephrolithiasis, including two sizable stones in the left renal pelvis. 5. Enlarged liver with moderate to severe increased steatosis.  Clinical correlation. 6. Prostatomegaly. Electronically signed by: Francis Quam MD 06/27/2024 12:27 AM EST RP Workstation: [id number]   DG Chest 2 View Result Date: 06/26/2024 EXAM: 2 VIEW(S) XRAY OF THE CHEST 06/26/2024 07:43:46 PM COMPARISON: 03/25/2024 CLINICAL HISTORY: fever FINDINGS: LUNGS AND PLEURA: There is focal consolidation within the posterobasal left lower lobe, possibly infectious in the appropriate clinical setting. Follow-up chest radiograph is recommended in 3-4 weeks, following conservative therapy, to document resolution. If persistent at that time, dedicated contrast enhanced CT imaging of the chest is recommended for further evaluation. No pleural effusion. No pneumothorax. HEART AND MEDIASTINUM: No acute abnormality of the cardiac and mediastinal silhouettes. 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 IMPRESSION: 1. Focal consolidation within the posterobasal left lower lobe, possibly infectious. Follow-up chest radiograph is recommended in 3-4 weeks, following conservative therapy, to document resolution. If persistent at that time, dedicated contrast enhanced CT imaging of the chest is recommended for further evaluation. 2. 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 Electronically signed by: Dorethia Molt MD 06/26/2024 08:13 PM EST RP Workstation: [id number]     Subjective: Patient was seen and examined at bedside.  Overnight events noted. Patient reports doing much better.  He successfully weaned down to room air.  He wants to be discharged home.  Discharge Exam: Vitals:   07/01/24 0456 07/01/24 0837  BP: 137/80 (!) 141/87  Pulse: 79 69  Resp: 18 20  Temp: 97.7 F (36.5 C) 97.7 F (36.5 C)  SpO2: 96% 95%   Vitals:   06/30/24 2031 07/01/24 0000 07/01/24 0456 07/01/24 0837  BP: 110/64 137/78 137/80 (!) 141/87  Pulse: 82 87 79 69  Resp: 14 18 18 20   Temp: 97.7 F (36.5 C) 97.8 F (36.6 C) 97.7 F (36.5 C) 97.7 F  (36.5 C)  TempSrc: Oral Oral Oral Oral  SpO2: 94% 95% 96% 95%  Weight:      Height:        General: Pt is alert, awake, not in acute distress Cardiovascular: RRR, S1/S2 +, no rubs, no gallops Respiratory: CTA bilaterally, no wheezing, no rhonchi Abdominal: Soft, NT, ND, bowel sounds + Extremities: no edema, no cyanosis    The results of significant diagnostics from this hospitalization (including imaging, microbiology, ancillary and laboratory) are listed below for reference.     Microbiology: Recent Results (from the past 240 hours)  Blood Culture (routine x 2)     Status: None   Collection Time: 06/26/24 10:09 PM   Specimen: BLOOD  Result Value Ref Range Status   Specimen Description BLOOD SITE NOT SPECIFIED  Final   Special Requests   Final    BOTTLES DRAWN AEROBIC ONLY Blood Culture adequate volume   Culture   Final    NO GROWTH 5 DAYS Performed at [facility], [address], [place], [place] [address]    Report Status 07/01/2024 FINAL  Final  Blood Culture (routine x 2)     Status: None   Collection  Time: 06/26/24 10:14 PM   Specimen: BLOOD  Result Value Ref Range Status   Specimen Description BLOOD SITE NOT SPECIFIED  Final   Special Requests   Final    BOTTLES DRAWN AEROBIC AND ANAEROBIC Blood Culture adequate volume   Culture   Final    NO GROWTH 5 DAYS Performed at [facility], [address], [place], [place] [address]    Report Status 07/01/2024 FINAL  Final  C Difficile Quick Screen w PCR reflex     Status: None   Collection Time: 06/27/24 12:38 PM   Specimen: STOOL  Result Value Ref Range Status   C Diff antigen NEGATIVE NEGATIVE Final   C Diff toxin NEGATIVE NEGATIVE Final   C Diff interpretation No C. difficile detected.  Final    Comment: Performed at [facility], [address], [place], [place] [address]     Labs: BNP (last 3 results) No results for input(s): BNP in the last 8760 hours. Basic Metabolic Panel: Recent  Labs  Lab 06/26/24 2212 06/27/24 0400 06/28/24 0812 06/29/24 0419  NA 134* 136 136 139  K 3.2* 3.4* 3.3* 4.1  CL 100 101 101 102  CO2 20* 23 26 31   GLUCOSE 119* 127* 102* 96  BUN 14 14 9 9   CREATININE 1.20 1.05 0.76 0.76  CALCIUM  7.7* 7.6* 8.3* 8.4*  MG  --  1.4* 1.8  --    Liver Function Tests: Recent Labs  Lab 06/26/24 2212  AST 41  ALT 28  ALKPHOS 65  BILITOT 0.6  PROT 6.7  ALBUMIN 3.1*   Recent Labs  Lab 06/26/24 2212  LIPASE 20   No results for input(s): AMMONIA in the last 168 hours. CBC: Recent Labs  Lab 06/26/24 2212 06/27/24 0400 06/28/24 0812  WBC 18.5* 18.5* 9.5  NEUTROABS 17.0* 16.8* 8.1*  HGB 13.0 12.4* 11.9*  HCT 39.8 38.5* 36.8*  MCV 84.9 85.4 84.6  PLT 219 201 184   Cardiac Enzymes: No results for input(s): CKTOTAL, CKMB, CKMBINDEX, TROPONINI in the last 168 hours. BNP: Invalid input(s): POCBNP CBG: No results for input(s): GLUCAP in the last 168 hours. D-Dimer No results for input(s): DDIMER in the last 72 hours. Hgb A1c No results for input(s): HGBA1C in the last 72 hours. Lipid Profile No results for input(s): CHOL, HDL, LDLCALC, TRIG, CHOLHDL, LDLDIRECT in the last 72 hours. Thyroid function studies No results for input(s): TSH, T4TOTAL, T3FREE, THYROIDAB in the last 72 hours.  Invalid input(s): FREET3 Anemia work up No results for input(s): VITAMINB12, FOLATE, FERRITIN, TIBC, IRON, RETICCTPCT in the last 72 hours. Urinalysis    Component Value Date/Time   COLORURINE YELLOW 06/27/2024 0109   APPEARANCEUR HAZY (A) 06/27/2024 0109   LABSPEC 1.033 (H) 06/27/2024 0109   PHURINE 5.0 06/27/2024 0109   GLUCOSEU NEGATIVE 06/27/2024 0109   HGBUR MODERATE (A) 06/27/2024 0109   BILIRUBINUR NEGATIVE 06/27/2024 0109   KETONESUR NEGATIVE 06/27/2024 0109   PROTEINUR 30 (A) 06/27/2024 0109   UROBILINOGEN 1.0 01/30/2020 1914   NITRITE NEGATIVE 06/27/2024 0109   LEUKOCYTESUR NEGATIVE  06/27/2024 0109   Sepsis Labs Recent Labs  Lab 06/26/24 2212 06/27/24 0400 06/28/24 0812  WBC 18.5* 18.5* 9.5   Microbiology Recent Results (from the past 240 hours)  Blood Culture (routine x 2)     Status: None   Collection Time: 06/26/24 10:09 PM   Specimen: BLOOD  Result Value Ref Range Status   Specimen Description BLOOD SITE NOT SPECIFIED  Final   Special Requests   Final    BOTTLES DRAWN AEROBIC ONLY Blood Culture adequate volume   Culture   Final    NO GROWTH 5 DAYS Performed at [facility], [address], [place], [place] [address]    Report Status 07/01/2024 FINAL  Final  Blood Culture (routine x 2)     Status: None   Collection Time: 06/26/24 10:14 PM   Specimen: BLOOD  Result Value Ref Range Status   Specimen Description BLOOD SITE NOT SPECIFIED  Final   Special Requests   Final    BOTTLES DRAWN AEROBIC AND ANAEROBIC Blood Culture adequate volume   Culture   Final    NO GROWTH 5 DAYS Performed at [facility], [address], [place], [place] [address]    Report Status 07/01/2024 FINAL  Final  C Difficile Quick Screen w PCR reflex     Status: None   Collection Time: 06/27/24 12:38 PM   Specimen: STOOL  Result Value Ref Range Status   C Diff antigen NEGATIVE NEGATIVE Final   C Diff toxin NEGATIVE NEGATIVE Final   C Diff interpretation No C. difficile detected.  Final    Comment: Performed at [facility], [address] [place], [place] [address]   Time coordinating discharge: Over 30 minutes  SIGNED:   Darcel Dawley, MD  [facility] 07/01/2024, 4:07 PM Pager   If 7PM-7AM, please contact night-coverage     [1]  Allergies Allergen Reactions   Ambien [Zolpidem] Anxiety    Pt hallucinates and has extreme anxiety.

## 2024-07-01 NOTE — Discharge Instructions (Signed)
 Advised to follow-up with primary care physician in 1 week. Advised to take Tamiflu  twice a day for 1 more day to complete course for 5 days.

## 2024-07-10 ENCOUNTER — Emergency Department (HOSPITAL_COMMUNITY)
Admission: EM | Admit: 2024-07-10 | Discharge: 2024-07-10 | Disposition: A | Discharge status: Home / Self Care | Attending: Emergency Medicine | Admitting: Emergency Medicine

## 2024-07-10 ENCOUNTER — Emergency Department (HOSPITAL_COMMUNITY)

## 2024-07-10 ENCOUNTER — Encounter (HOSPITAL_COMMUNITY): Payer: Self-pay

## 2024-07-10 ENCOUNTER — Inpatient Hospital Stay (HOSPITAL_COMMUNITY)
Admission: RE | Admit: 2024-07-10 | Discharge: 2024-07-10 | Payer: Self-pay | Attending: Physician Assistant | Admitting: Physician Assistant

## 2024-07-10 ENCOUNTER — Other Ambulatory Visit: Payer: Self-pay

## 2024-07-10 VITALS — BP 136/81 | HR 95 | Temp 97.8°F | Resp 18

## 2024-07-10 DIAGNOSIS — R0602 Shortness of breath: Secondary | ICD-10-CM

## 2024-07-10 DIAGNOSIS — R0781 Pleurodynia: Secondary | ICD-10-CM | POA: Diagnosis present

## 2024-07-10 LAB — CBC WITH DIFFERENTIAL/PLATELET
Abs Immature Granulocytes: 0.03 K/uL (ref 0.00–0.07)
Basophils Absolute: 0.1 K/uL (ref 0.0–0.1)
Basophils Relative: 1 %
Eosinophils Absolute: 0.2 K/uL (ref 0.0–0.5)
Eosinophils Relative: 3 %
HCT: 40.3 % (ref 39.0–52.0)
Hemoglobin: 13.1 g/dL (ref 13.0–17.0)
Immature Granulocytes: 0 %
Lymphocytes Relative: 17 %
Lymphs Abs: 1.4 K/uL (ref 0.7–4.0)
MCH: 27.5 pg (ref 26.0–34.0)
MCHC: 32.5 g/dL (ref 30.0–36.0)
MCV: 84.7 fL (ref 80.0–100.0)
Monocytes Absolute: 0.5 K/uL (ref 0.1–1.0)
Monocytes Relative: 6 %
Neutro Abs: 6 K/uL (ref 1.7–7.7)
Neutrophils Relative %: 73 %
Platelets: 482 K/uL — ABNORMAL HIGH (ref 150–400)
RBC: 4.76 MIL/uL (ref 4.22–5.81)
RDW: 15.2 % (ref 11.5–15.5)
WBC: 8.2 K/uL (ref 4.0–10.5)
nRBC: 0 % (ref 0.0–0.2)

## 2024-07-10 LAB — BASIC METABOLIC PANEL WITH GFR
Anion gap: 9 (ref 5–15)
BUN: 11 mg/dL (ref 6–20)
CO2: 25 mmol/L (ref 22–32)
Calcium: 9.3 mg/dL (ref 8.9–10.3)
Chloride: 105 mmol/L (ref 98–111)
Creatinine, Ser: 0.74 mg/dL (ref 0.61–1.24)
GFR, Estimated: >60 mL/min
Glucose, Bld: 117 mg/dL — ABNORMAL HIGH (ref 70–99)
Potassium: 4.1 mmol/L (ref 3.5–5.1)
Sodium: 139 mmol/L (ref 135–145)

## 2024-07-10 LAB — TROPONIN T, HIGH SENSITIVITY: Troponin T High Sensitivity: 17 ng/L (ref 0–19)

## 2024-07-10 MED ORDER — AMOXICILLIN-POT CLAVULANATE 875-125 MG PO TABS: 1.0000 | ORAL_TABLET | Freq: Two times a day (BID) | ORAL | 0 refills | Status: AC

## 2024-07-10 MED ORDER — DOXYCYCLINE HYCLATE 100 MG PO CAPS: 100.0000 mg | ORAL_CAPSULE | Freq: Two times a day (BID) | ORAL | 0 refills | Status: AC

## 2024-07-10 MED ORDER — AMOXICILLIN-POT CLAVULANATE 875-125 MG PO TABS
1.0000 | ORAL_TABLET | Freq: Once | ORAL | Status: AC
  Administered 2024-07-10: 1 via ORAL
  Filled 2024-07-10: qty 1

## 2024-07-10 MED ORDER — DOXYCYCLINE HYCLATE 100 MG PO TABS
100.0000 mg | ORAL_TABLET | Freq: Once | ORAL | Status: AC
  Administered 2024-07-10: 100 mg via ORAL
  Filled 2024-07-10: qty 1

## 2024-07-10 MED ORDER — IOHEXOL 350 MG/ML SOLN
75.0000 mL | Freq: Once | INTRAVENOUS | Status: AC | PRN
  Administered 2024-07-10: 75 mL via INTRAVENOUS

## 2024-07-10 NOTE — ED Provider Triage Note (Signed)
 Emergency Medicine Provider Triage Evaluation Note  Tyrone Fernandez , a 59 y.o. male  was evaluated in triage.  Pt complains of shob, pleuritic pain, recent hospitalization.  Review of Systems  Positive: SHOB,  Negative: Fever, chills, N/V, CP  Physical Exam  BP (!) 138/96 (BP Location: Right Arm)   Pulse 97   Temp 97.9 F (36.6 C)   Resp 20   SpO2 96%  Gen:   Awake, no distress   Resp:  Normal effort, increased effort, CTAB MSK:   Moves extremities without difficulty  Other:  Borderline tachycardic  Medical Decision Making  Medically screening exam initiated at 11:21 AM.  Appropriate orders placed.  Tyrone Fernandez Tyrone Fernandez Tyrone Fernandez was informed that the remainder of the evaluation will be completed by another provider, this initial triage assessment does not replace that evaluation, and the importance of remaining in the ED until their evaluation is complete.  Labs and imaging ordered   Tyrone Fernandez Tyrone Fernandez Tyrone Fernandez Tyrone Fernandez 07/10/24 1125

## 2024-07-10 NOTE — ED Triage Notes (Signed)
 Increased shortness of breath and pain on left side of chest and back. Recovering from hospital day last week, had pneumonia. - Entered by patient.  Pain located in the left rib, flank, and to the mid back. Patient using the incentive spirometer to aid in pneumonia recovery. Patient continues to have a slight productive cough.   Patient not taking any meds currently for symptoms.

## 2024-07-10 NOTE — Discharge Instructions (Signed)
Return for any problem.  ? ?Take all of antibiotics as prescribed. ?

## 2024-07-10 NOTE — ED Triage Notes (Signed)
 Pt c/o sob,  LUQ pain, and pain with deep breaths. Pt recently d/c PNA two weeks ago.   Pt was seen at [facility] today recommended to be seen in ER to r/u PE.

## 2024-07-10 NOTE — ED Notes (Signed)
 Pt to CT

## 2024-07-10 NOTE — ED Notes (Signed)
 Pt ambulated to restroom without assistance.

## 2024-07-10 NOTE — Discharge Instructions (Signed)
 Please go to the emergency room for further evaluation and management since we cannot rule out a blood clot in urgent care.

## 2024-07-10 NOTE — ED Notes (Signed)
 Patient is being discharged from the Urgent Care and sent to the Emergency Department via personal opperated vehicle . Per Rocky Gilford PA, patient is in need of higher level of care due to chest pain and SOB. Patient is aware and verbalizes understanding of plan of care.  Vitals:   07/10/24 1010  BP: 136/81  Pulse: 95  Resp: 18  Temp: 97.8 F (36.6 C)  SpO2: 91%

## 2024-07-10 NOTE — ED Provider Notes (Signed)
 " [facility] Provider Note   CSN: [id number] Arrival date & time: 07/10/24  1036     Patient presents with: Shortness of Breath   Tyrone Fernandez is a 59 y.o. male.   59 year old male with prior medical history as detailed below presents for evaluation.  Patient complains of left-sided pleuritic chest discomfort and associated mild shortness of breath.  He reports symptoms began over the last 2 to 3 days.  He was seen in urgent care earlier today and sent to the ED for evaluation.  Patient with recent hospitalization for pneumonia.  He reports that he was improved until the last 2 to 3 days.  The history is provided by the patient and medical records.       Prior to Admission medications  Medication Sig Start Date End Date Taking? Authorizing Provider  acetaminophen  (TYLENOL ) 500 MG tablet Take 1,000 mg by mouth every 6 (six) hours as needed for mild pain (pain score 1-3), headache or fever.    [provider]  fluticasone  (FLONASE ) 50 MCG/ACT nasal spray Place 1 spray into both nostrils daily. 03/14/24   [provider]  guaiFENesin -dextromethorphan  (ROBITUSSIN DM) 100-10 MG/5ML syrup Take 5 mLs by mouth every 4 (four) hours as needed for cough. 07/01/24   Leotis Bogus, MD  triamterene-hydrochlorothiazide (MAXZIDE) 75-50 MG tablet Take 0.5 tablets by mouth daily. 03/14/24   [provider]  Vitamin D , Ergocalciferol , (DRISDOL ) 1.25 MG (50000 UNIT) CAPS capsule Take 1 capsule (50,000 Units total) by mouth every 7 (seven) days. 07/05/24 10/03/24  Leotis Bogus, MD  albuterol  (PROVENTIL  HFA;VENTOLIN  HFA) 108 (90 BASE) MCG/ACT inhaler Inhale 1-2 puffs into the lungs every 6 (six) hours as needed for wheezing or shortness of breath. Patient not taking: Reported on 04/08/2016 09/03/14 01/30/20  Arlinda Katz, PA-C  omeprazole  (PRILOSEC) 10 MG capsule Take 1 capsule (10 mg total) by mouth daily. 11/07/17 01/30/20  Cuthriell,  Dorn JONETTA, PA-C    Allergies: Ambien [zolpidem]    Review of Systems  All other systems reviewed and are negative.   Updated Vital Signs BP (!) 138/96 (BP Location: Right Arm)   Pulse 97   Temp 97.9 F (36.6 C)   Resp 20   Ht 5' 10 (1.778 m)   Wt (!) 151.4 kg   SpO2 96%   BMI 47.89 kg/m   Physical Exam Vitals and nursing note reviewed.  Constitutional:      General: He is not in acute distress.    Appearance: Normal appearance. He is well-developed.  HENT:     Head: Normocephalic and atraumatic.  Eyes:     Conjunctiva/sclera: Conjunctivae normal.  Cardiovascular:     Rate and Rhythm: Normal rate and regular rhythm.     Heart sounds: No murmur heard. Pulmonary:     Effort: Pulmonary effort is normal. No respiratory distress.     Breath sounds: Normal breath sounds.  Abdominal:     Palpations: Abdomen is soft.     Tenderness: There is no abdominal tenderness.  Musculoskeletal:        General: No swelling.     Cervical back: Neck supple.  Skin:    General: Skin is warm and dry.     Capillary Refill: Capillary refill takes less than 2 seconds.  Neurological:     General: No focal deficit present.     Mental Status: He is alert and oriented to person, place, and time. Mental status is at baseline.  Psychiatric:        Mood and Affect: Mood normal.     (all labs ordered are listed, but only abnormal results are displayed) Labs Reviewed  BASIC METABOLIC PANEL WITH GFR - Abnormal; Notable for the following components:      Result Value   Glucose, Bld 117 (*)    All other components within normal limits  CBC WITH DIFFERENTIAL/PLATELET - Abnormal; Notable for the following components:   Platelets 482 (*)    All other components within normal limits  TROPONIN T, HIGH SENSITIVITY  TROPONIN T, HIGH SENSITIVITY    EKG: EKG Interpretation Date/Time:  Sunday July 10 2024 11:38:17 EST Ventricular Rate:  93 PR Interval:  170 QRS Duration:  80 QT  Interval:  352 QTC Calculation: 437 R Axis:   -38  Text Interpretation: Normal sinus rhythm Left axis deviation Low voltage QRS Abnormal ECG When compared with ECG of 27-Jun-2024 06:22, PREVIOUS ECG IS PRESENT Confirmed by Jolette Lana ([id number]) on 07/10/2024 1:13:56 PM  Radiology: No results found.   Procedures   Medications Ordered in the ED - No data to display                                  Medical Decision Making Patient complains of mild left-sided pleuritic discomfort.  Patient with recent hospitalization for treatment of pneumonia.  Patient is not hypoxic.  He does not appear to be toxic.  Obtained screening labs are without significant acute abnormality.  CT imaging does not reveal acute pulmonary embolus.  Patient does have likely residual infiltrate likely related to recently diagnosed pneumonia.  Patient offered admission for further observation.  He declines.  He wants to go home.  Given the fact that he has infiltrate still evident on CT, will provide with a course of antibiotics to treat possible residual pneumonia.  Importance of close follow-up in the outpatient setting is stressed.  Strict return precautions given understood.  Risk Prescription drug management.        Final diagnoses:  Pleuritic chest pain    ED Discharge Orders          Ordered    amoxicillin-clavulanate (AUGMENTIN) 875-125 MG tablet  Every 12 hours        07/10/24 1652    doxycycline (VIBRAMYCIN) 100 MG capsule  2 times daily        01 /11/26 1652               Laurice Maude BROCKS, MD 07/10/24 2010  "

## 2024-07-10 NOTE — ED Provider Notes (Signed)
 " [facility]    CSN: [id number] Arrival date & time: 07/10/24  9047      History   Chief Complaint Chief Complaint  Patient presents with   Wheezing   Appointment    HPI Tyrone Fernandez is a 59 y.o. male.   Patient presents today for evaluation of left chest/flank pain with associated cough and shortness of breath.  Patient was seen by our clinic on 06/26/2024 at which point he met sepsis criteria and so was transported to the emergency room by [facility].  At that time he tested positive for influenza and was noted to have pneumonia and was treated with Unisom, ceftriaxone , azithromycin , Tamiflu .  He was discharged on 07/01/2024 and was feeling better.  Over the past several days he has developed left sided chest and back pain that is worse with deep breathing and associated shortness of breath.  He still has a mild cough but denies any fever, nausea, vomiting, diarrhea.  He has been taking ibuprofen  which provided some improvement of symptoms.  He denies any history of VTE event.  Denies any significant calf pain or swelling.    Past Medical History:  Diagnosis Date   Allergy    H/O hiatal hernia    History of kidney stones    Hypertension    SBO (small bowel obstruction) (HCC) 03/2016    Patient Active Problem List   Diagnosis Date Noted   Sepsis (HCC) 06/27/2024   Influenza A with pneumonia 06/27/2024   CAP (community acquired pneumonia) 06/27/2024   Essential hypertension 06/27/2024   Hypokalemia 06/27/2024   Hyponatremia 06/27/2024   Hypocalcemia 06/27/2024   Ventral hernia with bowel obstruction 08/19/2021   Hernia of anterior abdominal wall    SBO (small bowel obstruction) (HCC) 04/08/2016   History of bronchitis 09/27/2014   Cough 09/27/2014   Obesity 09/27/2014    Past Surgical History:  Procedure Laterality Date   1984  KNEE SURGERY     2007 OR 2008  KIDNEY STONE REMOVED     APPENDECTOMY  06/30/2001   DONE AT TIME OF COLON RESECTION    CHOLECYSTECTOMY  06/30/2008   AT SAME TIME OF HERNIA REPAIR   COLON SURGERY  06/30/2001   COLON RESECTION FOR DIVERTICULITIS / COLOSTOMY   COLOSTOMY     COLOSTOMY REVERSAL  06/30/2001   CYSTOSCOPY WITH URETEROSCOPY AND STENT PLACEMENT Right 05/03/2013   Procedure: CYSTOSCOPY, RIGHT RETROGRADE PYELOGRAMRIGHT URETEROSCOPY, STONE EXTRACTION  AND STENT PLACEMENT;  Surgeon: Donnice Gwenyth Brooks, MD;  Location: [facility];  Service: Urology;  Laterality: Right;   HERNIA REPAIR  06/30/2008   POLYPECTOMY     VENTRAL HERNIA REPAIR N/A 08/19/2021   Procedure: OPEN HERNIA REPAIR VENTRAL ADULT;  Surgeon: Vernetta Berg, MD;  Location: [facility];  Service: General;  Laterality: N/A;       Home Medications    Prior to Admission medications  Medication Sig Start Date End Date Taking? Authorizing Provider  guaiFENesin -dextromethorphan  (ROBITUSSIN DM) 100-10 MG/5ML syrup Take 5 mLs by mouth every 4 (four) hours as needed for cough. 07/01/24  Yes Leotis Bogus, MD  triamterene-hydrochlorothiazide (MAXZIDE) 75-50 MG tablet Take 0.5 tablets by mouth daily. 03/14/24  Yes [provider]  acetaminophen  (TYLENOL ) 500 MG tablet Take 1,000 mg by mouth every 6 (six) hours as needed for mild pain (pain score 1-3), headache or fever.    [provider]  fluticasone  (FLONASE ) 50 MCG/ACT nasal spray Place 1 spray into both nostrils daily. 03/14/24   [provider]  Vitamin D , Ergocalciferol , (DRISDOL ) 1.25 MG (50000 UNIT) CAPS capsule Take 1 capsule (50,000 Units total) by mouth every 7 (seven) days. 07/05/24 10/03/24  Leotis Bogus, MD  albuterol  (PROVENTIL  HFA;VENTOLIN  HFA) 108 (90 BASE) MCG/ACT inhaler Inhale 1-2 puffs into the lungs every 6 (six) hours as needed for wheezing or shortness of breath. Patient not taking: Reported on 04/08/2016 09/03/14 01/30/20  Arlinda Katz, PA-C  omeprazole  (PRILOSEC) 10 MG capsule Take 1 capsule (10 mg total) by mouth daily. 11/07/17 01/30/20  Cuthriell,  Dorn BIRCH, PA-C    Family History Family History  Problem Relation Age of Onset   Crohn's disease Mother    Diabetes Mother    Cancer Mother        breast cancer    Heart disease Paternal Grandfather    Stroke Maternal Aunt    Colon cancer Neg Hx    Pancreatic cancer Neg Hx    Rectal cancer Neg Hx    Stomach cancer Neg Hx    Ulcerative colitis Neg Hx     Social History Social History[1]   Allergies   Ambien [zolpidem]   Review of Systems Review of Systems  Constitutional:  Positive for activity change. Negative for appetite change, fatigue and fever.  Respiratory:  Positive for cough and shortness of breath.   Cardiovascular:  Positive for chest pain. Negative for palpitations and leg swelling.  Musculoskeletal:  Positive for back pain. Negative for arthralgias and myalgias.     Physical Exam Triage Vital Signs ED Triage Vitals  Encounter Vitals Group     BP 07/10/24 1010 136/81     Girls Systolic BP Percentile --      Girls Diastolic BP Percentile --      Boys Systolic BP Percentile --      Boys Diastolic BP Percentile --      Pulse Rate 07/10/24 1010 95     Resp 07/10/24 1010 18     Temp 07/10/24 1010 97.8 F (36.6 C)     Temp Source 07/10/24 1010 Oral     SpO2 07/10/24 1010 91 %     Weight --      Height --      Head Circumference --      Peak Flow --      Pain Score 07/10/24 1008 7     Pain Loc --      Pain Education --      Exclude from Growth Chart --    No data found.  Updated Vital Signs BP 136/81 (BP Location: Left Arm)   Pulse 95   Temp 97.8 F (36.6 C) (Oral)   Resp 18   SpO2 91%   Visual Acuity Right Eye Distance:   Left Eye Distance:   Bilateral Distance:    Right Eye Near:   Left Eye Near:    Bilateral Near:     Physical Exam Vitals reviewed.  Constitutional:      General: He is awake.     Appearance: Normal appearance. He is well-developed. He is not ill-appearing.     Comments: Very pleasant male appears stated age  in no acute distress sitting comfortably in exam room  HENT:     Head: Normocephalic and atraumatic.  Cardiovascular:     Rate and Rhythm: Normal rate and regular rhythm.     Heart sounds: Normal heart sounds, S1 normal and S2 normal. No murmur heard. Pulmonary:     Effort: Pulmonary effort is normal.  Breath sounds: No stridor. Examination of the left-lower field reveals decreased breath sounds. Decreased breath sounds present. No wheezing, rhonchi or rales.  Chest:     Chest wall: No deformity, swelling or tenderness.     Comments: Pain is not reproducible on exam. Abdominal:     Palpations: Abdomen is soft.     Tenderness: There is no abdominal tenderness.  Skin:    Findings: No rash.     Comments: No rash in distribution of pain  Neurological:     Mental Status: He is alert.  Psychiatric:        Behavior: Behavior is cooperative.      UC Treatments / Results  Labs (all labs ordered are listed, but only abnormal results are displayed) Labs Reviewed - No data to display  EKG   Radiology No results found.  Procedures Procedures (including critical care time)  Medications Ordered in UC Medications - No data to display  Initial Impression / Assessment and Plan / UC Course  I have reviewed the triage vital signs and the nursing notes.  Pertinent labs & imaging results that were available during my care of the patient were reviewed by me and considered in my medical decision making (see chart for details).     Patient is well-appearing, afebrile, nontoxic, nontachycardic.  His oxygen saturation was 91% which is decreased from baseline when discharged from the ER and he reports that his heart rate has been increasing from around 70/80 as his baseline resting heart rate to low 100s according to his Apple Watch.  He is concerned that his pneumonia is returning.  We discussed that given his recent hospitalization and pleuritic chest pain with associated shortness of  breath it is important that we rule out a pulmonary embolism as contributing to his symptoms. PERC score 2,  Wells score 4.5. Initially we discussed potential utility of ordering a D-dimer in urgent care but discussed that if this is positive he would have to go to the emergency room for a CTA and after discussion of logistics and capabilities of urgent care patient elected to go directly to the ER for further evaluation and management.  He was stable to time discharge and safe for private transport.  He will go directly across our parking lot to [facility], ER for further evaluation and management.  Final Clinical Impressions(s) / UC Diagnoses   Final diagnoses:  Pleuritic chest pain  Shortness of breath     Discharge Instructions      Please go to the emergency room for further evaluation and management since we cannot rule out a blood clot in urgent care.    ED Prescriptions   None    PDMP not reviewed this encounter.    [1]  Social History Tobacco Use   Smoking status: Never   Smokeless tobacco: Never  Vaping Use   Vaping status: Never Used  Substance Use Topics   Alcohol use: No   Drug use: No     Sherrell Rocky POUR, PA-C 07/10/24 1034  "

## 2024-07-18 ENCOUNTER — Other Ambulatory Visit (HOSPITAL_COMMUNITY): Payer: Self-pay | Admitting: Urology

## 2024-07-18 ENCOUNTER — Other Ambulatory Visit: Payer: Self-pay | Admitting: Urology

## 2024-07-18 DIAGNOSIS — N200 Calculus of kidney: Secondary | ICD-10-CM

## 2024-07-20 ENCOUNTER — Ambulatory Visit: Admission: RE | Admit: 2024-07-20 | Discharge: 2024-07-20 | Disposition: A | Source: Ambulatory Visit

## 2024-07-20 ENCOUNTER — Other Ambulatory Visit: Payer: Self-pay

## 2024-07-20 DIAGNOSIS — R053 Chronic cough: Secondary | ICD-10-CM

## 2024-08-30 ENCOUNTER — Other Ambulatory Visit (HOSPITAL_COMMUNITY)

## 2024-08-30 ENCOUNTER — Ambulatory Visit (HOSPITAL_COMMUNITY)

## 2024-08-30 ENCOUNTER — Ambulatory Visit (HOSPITAL_COMMUNITY): Admit: 2024-08-30 | Admitting: Urology

## 2024-08-30 SURGERY — NEPHROLITHOTOMY PERCUTANEOUS
Anesthesia: General | Laterality: Left
